# Patient Record
Sex: Male | Born: 1935 | Race: Black or African American | Hispanic: No | Marital: Single | Smoking: Former smoker
Health system: Southern US, Community
[De-identification: ages and names within clinical notes are randomized; demographics above are authoritative.]

## PROBLEM LIST (undated history)

## (undated) DIAGNOSIS — R569 Unspecified convulsions: Secondary | ICD-10-CM

## (undated) DIAGNOSIS — F32A Depression, unspecified: Secondary | ICD-10-CM

## (undated) DIAGNOSIS — I639 Cerebral infarction, unspecified: Secondary | ICD-10-CM

## (undated) DIAGNOSIS — G9340 Encephalopathy, unspecified: Secondary | ICD-10-CM

## (undated) DIAGNOSIS — I10 Essential (primary) hypertension: Secondary | ICD-10-CM

## (undated) DIAGNOSIS — F419 Anxiety disorder, unspecified: Secondary | ICD-10-CM

## (undated) DIAGNOSIS — R778 Other specified abnormalities of plasma proteins: Secondary | ICD-10-CM

## (undated) DIAGNOSIS — J189 Pneumonia, unspecified organism: Secondary | ICD-10-CM

## (undated) DIAGNOSIS — F0280 Dementia in other diseases classified elsewhere without behavioral disturbance: Secondary | ICD-10-CM

## (undated) DIAGNOSIS — E119 Type 2 diabetes mellitus without complications: Secondary | ICD-10-CM

## (undated) DIAGNOSIS — R7989 Other specified abnormal findings of blood chemistry: Secondary | ICD-10-CM

## (undated) DIAGNOSIS — H409 Unspecified glaucoma: Secondary | ICD-10-CM

## (undated) DIAGNOSIS — F0390 Unspecified dementia without behavioral disturbance: Secondary | ICD-10-CM

## (undated) DIAGNOSIS — E860 Dehydration: Secondary | ICD-10-CM

## (undated) DIAGNOSIS — G309 Alzheimer's disease, unspecified: Secondary | ICD-10-CM

## (undated) DIAGNOSIS — F329 Major depressive disorder, single episode, unspecified: Secondary | ICD-10-CM

## (undated) DIAGNOSIS — L8990 Pressure ulcer of unspecified site, unspecified stage: Secondary | ICD-10-CM

## (undated) DIAGNOSIS — E785 Hyperlipidemia, unspecified: Secondary | ICD-10-CM

## (undated) HISTORY — DX: Encephalopathy, unspecified: G93.40

## (undated) HISTORY — DX: Other specified abnormalities of plasma proteins: R77.8

## (undated) HISTORY — DX: Anxiety disorder, unspecified: F41.9

## (undated) HISTORY — DX: Unspecified glaucoma: H40.9

## (undated) HISTORY — DX: Other specified abnormal findings of blood chemistry: R79.89

## (undated) HISTORY — DX: Unspecified dementia, unspecified severity, without behavioral disturbance, psychotic disturbance, mood disturbance, and anxiety: F03.90

## (undated) HISTORY — DX: Dehydration: E86.0

## (undated) HISTORY — DX: Depression, unspecified: F32.A

## (undated) HISTORY — DX: Cerebral infarction, unspecified: I63.9

## (undated) HISTORY — DX: Pneumonia, unspecified organism: J18.9

## (undated) HISTORY — PX: NO PAST SURGERIES: SHX2092

## (undated) HISTORY — DX: Hyperlipidemia, unspecified: E78.5

## (undated) HISTORY — DX: Pressure ulcer of unspecified site, unspecified stage: L89.90

## (undated) HISTORY — DX: Major depressive disorder, single episode, unspecified: F32.9

---

## 2003-09-15 ENCOUNTER — Emergency Department (HOSPITAL_COMMUNITY): Admission: EM | Admit: 2003-09-15 | Discharge: 2003-09-15 | Payer: Self-pay | Admitting: Emergency Medicine

## 2003-09-17 ENCOUNTER — Emergency Department (HOSPITAL_COMMUNITY): Admission: EM | Admit: 2003-09-17 | Discharge: 2003-09-18 | Payer: Self-pay | Admitting: Emergency Medicine

## 2007-03-03 ENCOUNTER — Emergency Department (HOSPITAL_COMMUNITY): Admission: EM | Admit: 2007-03-03 | Discharge: 2007-03-03 | Payer: Self-pay | Admitting: Emergency Medicine

## 2007-09-30 ENCOUNTER — Emergency Department (HOSPITAL_COMMUNITY): Admission: EM | Admit: 2007-09-30 | Discharge: 2007-09-30 | Payer: Self-pay | Admitting: Emergency Medicine

## 2008-12-22 ENCOUNTER — Emergency Department (HOSPITAL_COMMUNITY): Admission: EM | Admit: 2008-12-22 | Discharge: 2008-12-22 | Payer: Self-pay | Admitting: Emergency Medicine

## 2010-07-23 LAB — URINALYSIS, ROUTINE W REFLEX MICROSCOPIC
Glucose, UA: 250 mg/dL — AB
Leukocytes, UA: NEGATIVE
Nitrite: NEGATIVE
Protein, ur: NEGATIVE mg/dL
Urobilinogen, UA: 1 mg/dL (ref 0.0–1.0)

## 2010-07-23 LAB — URINE MICROSCOPIC-ADD ON

## 2010-07-23 LAB — URINE CULTURE: Colony Count: 75000

## 2010-10-15 ENCOUNTER — Ambulatory Visit (HOSPITAL_COMMUNITY): Admit: 2010-10-15 | Payer: Self-pay | Admitting: Cardiology

## 2011-01-25 LAB — I-STAT 8, (EC8 V) (CONVERTED LAB)
BUN: 18
Glucose, Bld: 112 — ABNORMAL HIGH
Operator id: 151321
Potassium: 3.9
Sodium: 140
pH, Ven: 7.361 — ABNORMAL HIGH

## 2011-01-25 LAB — DIFFERENTIAL
Basophils Absolute: 0
Eosinophils Absolute: 0.1 — ABNORMAL LOW
Lymphs Abs: 2.7
Monocytes Relative: 7

## 2011-01-25 LAB — RAPID URINE DRUG SCREEN, HOSP PERFORMED
Benzodiazepines: NOT DETECTED
Cocaine: NOT DETECTED

## 2011-01-25 LAB — CBC
Hemoglobin: 14.3
RBC: 4.77

## 2011-01-25 LAB — POCT I-STAT CREATININE
Creatinine, Ser: 0.6
Operator id: 151321

## 2011-01-25 LAB — POCT CARDIAC MARKERS: Myoglobin, poc: 68

## 2013-11-30 ENCOUNTER — Inpatient Hospital Stay (HOSPITAL_COMMUNITY)
Admission: EM | Admit: 2013-11-30 | Discharge: 2013-12-03 | DRG: 066 | Disposition: A | Discharge status: Home / Self Care | Payer: Medicare HMO | Attending: Internal Medicine | Admitting: Internal Medicine

## 2013-11-30 ENCOUNTER — Emergency Department (HOSPITAL_COMMUNITY): Payer: Medicare HMO

## 2013-11-30 ENCOUNTER — Encounter (HOSPITAL_COMMUNITY): Payer: Self-pay | Admitting: Emergency Medicine

## 2013-11-30 DIAGNOSIS — I10 Essential (primary) hypertension: Secondary | ICD-10-CM | POA: Diagnosis present

## 2013-11-30 DIAGNOSIS — Z87891 Personal history of nicotine dependence: Secondary | ICD-10-CM

## 2013-11-30 DIAGNOSIS — R4182 Altered mental status, unspecified: Secondary | ICD-10-CM | POA: Diagnosis present

## 2013-11-30 DIAGNOSIS — Z8673 Personal history of transient ischemic attack (TIA), and cerebral infarction without residual deficits: Secondary | ICD-10-CM | POA: Diagnosis present

## 2013-11-30 DIAGNOSIS — F0280 Dementia in other diseases classified elsewhere without behavioral disturbance: Secondary | ICD-10-CM | POA: Diagnosis present

## 2013-11-30 DIAGNOSIS — Z79899 Other long term (current) drug therapy: Secondary | ICD-10-CM

## 2013-11-30 DIAGNOSIS — IMO0002 Reserved for concepts with insufficient information to code with codable children: Secondary | ICD-10-CM

## 2013-11-30 DIAGNOSIS — F0390 Unspecified dementia without behavioral disturbance: Secondary | ICD-10-CM

## 2013-11-30 DIAGNOSIS — I6350 Cerebral infarction due to unspecified occlusion or stenosis of unspecified cerebral artery: Secondary | ICD-10-CM | POA: Diagnosis present

## 2013-11-30 DIAGNOSIS — Z833 Family history of diabetes mellitus: Secondary | ICD-10-CM

## 2013-11-30 DIAGNOSIS — G9340 Encephalopathy, unspecified: Secondary | ICD-10-CM

## 2013-11-30 DIAGNOSIS — R1311 Dysphagia, oral phase: Secondary | ICD-10-CM | POA: Diagnosis present

## 2013-11-30 DIAGNOSIS — I519 Heart disease, unspecified: Secondary | ICD-10-CM | POA: Diagnosis present

## 2013-11-30 DIAGNOSIS — F0391 Unspecified dementia with behavioral disturbance: Secondary | ICD-10-CM

## 2013-11-30 DIAGNOSIS — IMO0001 Reserved for inherently not codable concepts without codable children: Secondary | ICD-10-CM | POA: Diagnosis present

## 2013-11-30 DIAGNOSIS — E1165 Type 2 diabetes mellitus with hyperglycemia: Secondary | ICD-10-CM | POA: Diagnosis present

## 2013-11-30 DIAGNOSIS — G309 Alzheimer's disease, unspecified: Secondary | ICD-10-CM | POA: Diagnosis present

## 2013-11-30 DIAGNOSIS — R569 Unspecified convulsions: Secondary | ICD-10-CM

## 2013-11-30 DIAGNOSIS — E785 Hyperlipidemia, unspecified: Secondary | ICD-10-CM

## 2013-11-30 DIAGNOSIS — Z66 Do not resuscitate: Secondary | ICD-10-CM | POA: Diagnosis present

## 2013-11-30 HISTORY — DX: Alzheimer's disease, unspecified: G30.9

## 2013-11-30 HISTORY — DX: Type 2 diabetes mellitus without complications: E11.9

## 2013-11-30 HISTORY — DX: Essential (primary) hypertension: I10

## 2013-11-30 HISTORY — DX: Dementia in other diseases classified elsewhere, unspecified severity, without behavioral disturbance, psychotic disturbance, mood disturbance, and anxiety: F02.80

## 2013-11-30 LAB — CBC WITH DIFFERENTIAL/PLATELET
BASOS ABS: 0 10*3/uL (ref 0.0–0.1)
BASOS PCT: 0 % (ref 0–1)
BASOS PCT: 0 % (ref 0–1)
Basophils Absolute: 0 10*3/uL (ref 0.0–0.1)
EOS PCT: 1 % (ref 0–5)
Eosinophils Absolute: 0 10*3/uL (ref 0.0–0.7)
Eosinophils Absolute: 0 10*3/uL (ref 0.0–0.7)
Eosinophils Relative: 0 % (ref 0–5)
HCT: 40.2 % (ref 39.0–52.0)
HCT: 38.5 % — ABNORMAL LOW (ref 39.0–52.0)
Hemoglobin: 13 g/dL (ref 13.0–17.0)
Hemoglobin: 12.4 g/dL — ABNORMAL LOW (ref 13.0–17.0)
LYMPHS ABS: 1.2 10*3/uL (ref 0.7–4.0)
LYMPHS PCT: 18 % (ref 12–46)
Lymphocytes Relative: 21 % (ref 12–46)
Lymphs Abs: 1.8 10*3/uL (ref 0.7–4.0)
MCH: 30 pg (ref 26.0–34.0)
MCH: 29.3 pg (ref 26.0–34.0)
MCHC: 32.3 g/dL (ref 30.0–36.0)
MCHC: 32.2 g/dL (ref 30.0–36.0)
MCV: 92.8 fL (ref 78.0–100.0)
MCV: 91 fL (ref 78.0–100.0)
MONO ABS: 0.4 10*3/uL (ref 0.1–1.0)
Monocytes Absolute: 0.7 10*3/uL (ref 0.1–1.0)
Monocytes Relative: 7 % (ref 3–12)
Monocytes Relative: 8 % (ref 3–12)
NEUTROS ABS: 4.9 10*3/uL (ref 1.7–7.7)
NEUTROS ABS: 6.2 10*3/uL (ref 1.7–7.7)
NEUTROS PCT: 74 % (ref 43–77)
NEUTROS PCT: 71 % (ref 43–77)
PLATELETS: 214 10*3/uL (ref 150–400)
Platelets: 206 10*3/uL (ref 150–400)
RBC: 4.33 MIL/uL (ref 4.22–5.81)
RBC: 4.23 MIL/uL (ref 4.22–5.81)
RDW: 12.5 % (ref 11.5–15.5)
RDW: 12.4 % (ref 11.5–15.5)
WBC: 6.5 10*3/uL (ref 4.0–10.5)
WBC: 8.8 10*3/uL (ref 4.0–10.5)

## 2013-11-30 LAB — COMPREHENSIVE METABOLIC PANEL
ALBUMIN: 3.4 g/dL — AB (ref 3.5–5.2)
ALK PHOS: 70 U/L (ref 39–117)
ALK PHOS: 68 U/L (ref 39–117)
ALT: 38 U/L (ref 0–53)
ALT: 35 U/L (ref 0–53)
ANION GAP: 13 (ref 5–15)
AST: 37 U/L (ref 0–37)
AST: 25 U/L (ref 0–37)
Albumin: 3.3 g/dL — ABNORMAL LOW (ref 3.5–5.2)
Anion gap: 13 (ref 5–15)
BILIRUBIN TOTAL: 0.3 mg/dL (ref 0.3–1.2)
BILIRUBIN TOTAL: 0.4 mg/dL (ref 0.3–1.2)
BUN: 20 mg/dL (ref 6–23)
BUN: 14 mg/dL (ref 6–23)
CALCIUM: 9.4 mg/dL (ref 8.4–10.5)
CHLORIDE: 101 meq/L (ref 96–112)
CO2: 23 meq/L (ref 19–32)
CO2: 23 mEq/L (ref 19–32)
CREATININE: 0.37 mg/dL — AB (ref 0.50–1.35)
Calcium: 9.2 mg/dL (ref 8.4–10.5)
Chloride: 99 mEq/L (ref 96–112)
Creatinine, Ser: 0.32 mg/dL — ABNORMAL LOW (ref 0.50–1.35)
GFR calc Af Amer: >90 mL/min (ref >90)
GFR calc non Af Amer: >90 mL/min (ref >90)
Glucose, Bld: 261 mg/dL — ABNORMAL HIGH (ref 70–99)
Glucose, Bld: 160 mg/dL — ABNORMAL HIGH (ref 70–99)
POTASSIUM: 4.9 meq/L (ref 3.7–5.3)
POTASSIUM: 4.1 meq/L (ref 3.7–5.3)
Sodium: 135 mEq/L — ABNORMAL LOW (ref 137–147)
Sodium: 137 mEq/L (ref 137–147)
TOTAL PROTEIN: 7.7 g/dL (ref 6.0–8.3)
Total Protein: 7.6 g/dL (ref 6.0–8.3)

## 2013-11-30 LAB — URINALYSIS, ROUTINE W REFLEX MICROSCOPIC
Bilirubin Urine: NEGATIVE
Glucose, UA: 500 mg/dL — AB
KETONES UR: NEGATIVE mg/dL
Leukocytes, UA: NEGATIVE
Nitrite: NEGATIVE
PROTEIN: NEGATIVE mg/dL
SPECIFIC GRAVITY, URINE: 1.02 (ref 1.005–1.030)
UROBILINOGEN UA: 1 mg/dL (ref 0.0–1.0)
pH: 7 (ref 5.0–8.0)

## 2013-11-30 LAB — TROPONIN I

## 2013-11-30 LAB — I-STAT TROPONIN, ED: TROPONIN I, POC: 0.01 ng/mL (ref 0.00–0.08)

## 2013-11-30 LAB — URINE MICROSCOPIC-ADD ON

## 2013-11-30 MED ORDER — CHLORHEXIDINE GLUCONATE 0.12 % MT SOLN
15.0000 mL | Freq: Two times a day (BID) | OROMUCOSAL | Status: DC
  Administered 2013-12-01 – 2013-12-03 (×5): 15 mL via OROMUCOSAL
  Filled 2013-11-30 (×9): qty 15

## 2013-11-30 MED ORDER — ENOXAPARIN SODIUM 40 MG/0.4ML ~~LOC~~ SOLN
40.0000 mg | SUBCUTANEOUS | Status: DC
  Administered 2013-11-30 – 2013-12-02 (×3): 40 mg via SUBCUTANEOUS
  Filled 2013-11-30 (×4): qty 0.4

## 2013-11-30 MED ORDER — CETYLPYRIDINIUM CHLORIDE 0.05 % MT LIQD
7.0000 mL | Freq: Two times a day (BID) | OROMUCOSAL | Status: DC
  Administered 2013-12-01 – 2013-12-03 (×4): 7 mL via OROMUCOSAL

## 2013-11-30 MED ORDER — SODIUM CHLORIDE 0.9 % IV SOLN
INTRAVENOUS | Status: AC
  Administered 2013-11-30 (×2): via INTRAVENOUS

## 2013-11-30 MED ORDER — HYDRALAZINE HCL 20 MG/ML IJ SOLN: 10.0000 mg | INTRAMUSCULAR | Status: DC | PRN

## 2013-11-30 MED ORDER — INSULIN ASPART 100 UNIT/ML ~~LOC~~ SOLN
0.0000 [IU] | Freq: Three times a day (TID) | SUBCUTANEOUS | Status: DC
  Administered 2013-12-01 (×2): 1 [IU] via SUBCUTANEOUS

## 2013-11-30 NOTE — ED Notes (Signed)
Pt states that there is nothing wrong and he does not know why his sister called EMS. Pt is alert to self, DOB, but answered year with [phone] and president as [place]. Pt reluctantly follows comands. Pt in NAD

## 2013-11-30 NOTE — ED Notes (Signed)
Pt in CT.

## 2013-11-30 NOTE — ED Notes (Signed)
Pt EKG changed and showed vent trigeminy. EKG performed and given to Dr. Littie DeedsGentry. Pt in NAD and VSS

## 2013-11-30 NOTE — ED Notes (Signed)
Pt from home vis EMS-Per EMS, pt lives with sister. Pt sister called EMS c/o AMS. Pt has hx of Alzheimers, DM II, HTN. Pt family reports that pt suddenly stopped eating and would not talk or respond to anyone. After a few minutes, pt started to respond and talk again. Pt calm and cooperative.  Pt in NAD

## 2013-11-30 NOTE — ED Provider Notes (Signed)
CSN: [id number]     Arrival date & time 11/30/13  1530 History   First MD Initiated Contact with Patient 11/30/13 1544     Chief Complaint  Patient presents with  . Altered Mental Status     (Consider location/radiation/quality/duration/timing/severity/associated sxs/prior Treatment) Patient is a 79 y.o. male presenting with altered mental status.  Altered Mental Status Presenting symptoms: partial responsiveness   Severity:  Moderate Most recent episode:  Today Episode history:  Single Duration: a few minutes. Timing:  Constant Progression:  Unchanged Chronicity:  New Context: dementia   Context: not head injury, not a recent change in medication (pt manages his owm medications despite alzheimers) and not a recent illness   Associated symptoms: no abdominal pain, no fever, no nausea and no vomiting     Past Medical History  Diagnosis Date  . Diabetes mellitus without complication   . Hypertension   . Alzheimer disease    History reviewed. No pertinent past surgical history. No family history on file. History  Substance Use Topics  . Smoking status: Former Games developermoker  . Smokeless tobacco: Not on file  . Alcohol Use: No    Review of Systems  Unable to perform ROS: Dementia  Constitutional: Negative for fever.  Gastrointestinal: Negative for nausea, vomiting and abdominal pain.      Allergies  Review of patient's allergies indicates not on file.  Home Medications   Prior to Admission medications   Not on File   BP 109/60  Pulse 84  Temp(Src) 99 F (37.2 C) (Oral)  Resp 22  SpO2 97% Physical Exam  Vitals reviewed. Constitutional: He appears well-developed and well-nourished.  HENT:  Head: Normocephalic and atraumatic.  Eyes: Conjunctivae and EOM are normal.  Neck: Normal range of motion. Neck supple.  Cardiovascular: Normal rate, regular rhythm and normal heart sounds.   Pulmonary/Chest: Effort normal and breath sounds normal. No respiratory distress.   Abdominal: He exhibits no distension. There is no tenderness. There is no rebound and no guarding.  Musculoskeletal: Normal range of motion.  Neurological: He is alert. He is disoriented.  Oriented to name and place, not year.  MAEW, no neuro deficits in context of limited exam due to compliance   Skin: Skin is warm and dry.    ED Course  Procedures (including critical care time) Labs Review Labs Reviewed - No data to display  Imaging Review No results found.   EKG Interpretation None      MDM   Final diagnoses:  None    79 y.o. male  with pertinent PMH of alzheimer's dementia, dm presents with altered mental status.  Pt was in his normal state of health until shortly PTA, had a brief (few minutes) episode of maintained posture but refusal to respond to questions.  This self resolved.  No fevers or other infectious symptoms reported.  Limited exam as above secondary to dementia, however no obvious deficits.    Labs and imaging as above reviewed. Pt had a very brief <10 second episode of rigors, refused to answer questions, and had HR 140, with poor O2 waveform.  He maintained posture, did not appear seizure like.  Troponin ordered due to tachycardia occurring during these episodes and possibility of anginal equivalent.  Also cannot rule out atypical seizure or TIA. Consulted hospitalist for admission.  1. Altered mental status, unspecified altered mental status type   2. Acute encephalopathy   3. Dementia, without behavioral disturbance   4. Diabetes mellitus type 2, uncontrolled  5. Essential hypertension         Mirian Mo, MD 12/01/13 [phone]

## 2013-11-30 NOTE — ED Notes (Signed)
Bed: [id number] Expected date: 11/30/13 Expected time: 3:16 PM Means of arrival: Ambulance Comments: AMS

## 2013-11-30 NOTE — ED Notes (Addendum)
Unable to completed NIHSS assessment, pt. Is uncooperative, confused and incoherent , unable/ refused to follow commands. Pt found bil. Arms strong enough to resist assessment, almost hit this Nurse upon doing assessment.  Dr. Toniann FailKakrakandy notified. Charge Nurse notified.

## 2013-11-30 NOTE — ED Notes (Signed)
Dr. Gentry at bedside. 

## 2013-11-30 NOTE — Progress Notes (Addendum)
Central Tele alerted this RN that patient was becoming tachycardic. This RN went to assess patient and found him masturbating. Asked patient to stop. He has stopped and gone back to sleep for now. HR returned to normal. Will continue to monitor.

## 2013-11-30 NOTE — H&P (Signed)
[facility] History and Physical  Eric CopaRobert Doris [id number] DOB: Oct 19, 1934 DOA: 11/30/2013  Referring physician: ER physician. PCP: No primary provider on file.   Chief Complaint: Altered mental status.  HPI: Eric Cameron is a 79 y.o. male with history of dementia, diabetes mellitus (off medications for last 6 months), hypertension and hyperlipidemia was brought to the ER after patient's sister found the patient suddenly slumped to the left side while sitting on the chair at around 2:30 PM. Patient also became rigid and unresponsive for less than 10 seconds. Following which patient was looking more confused. Patient was brought to the ER and had a similar episode in the ER which lasted for few seconds. After the incident patient per the family looks more confused than usual and was also tachycardic during the episode with rates going up to 140 per minute. CT head did not show anything acute. Patient at this time will be admitted for further observation and management. As per patient's sister with whom patient lives patient did not have any nausea vomiting abdominal pain fever chills diarrhea chest pain or shortness of breath. No new medications were added. On my exam patient is following commands but refuses to open his mouth and eyes.   Review of Systems: As presented in the history of presenting illness, rest negative.  Past Medical History  Diagnosis Date  . Diabetes mellitus without complication   . Hypertension   . Alzheimer disease    History reviewed. No pertinent past surgical history. Social History:  reports that he has quit smoking. He does not have any smokeless tobacco history on file. He reports that he does not drink alcohol or use illicit drugs. Where does patient live home. Can patient participate in ADLs? No.  No Known Allergies  Family History:  Family History  Problem Relation Age of Onset  . Diabetes Mellitus II Brother   . Dementia Neg Hx        Prior to Admission medications   Not on File    Physical Exam: Filed Vitals:   11/30/13 1845 11/30/13 1900 11/30/13 1930 11/30/13 1953  BP:  102/57 95/53 100/62  Pulse: 82 81 80 80  Temp:      TempSrc:      Resp: 18 15 16 16   SpO2: 94% 96% 96% 97%     General:  Moderately built and nourished.  Eyes:  Patient refuses to open his eyes.  ENT: Diffuse to open his mouth. No discharge from the ears nose.  Neck: No neck rigidity.  Cardiovascular: S1-S2 heard.  Respiratory: No rhonchi or crepitations.  Abdomen: Soft nontender bowel sounds present. No guarding or rigidity.  Skin: No rash.  Musculoskeletal: No edema.  Psychiatric: Patient is following commands.  Neurologic: Patient is following commands but refuses to open his eyes or mouth. Moves all extremities. Further CNS exam difficult.  Labs on Admission:  Basic Metabolic Panel:  Recent Labs Lab 11/30/13 1603  NA 135*  K 4.9  CL 99  CO2 23  GLUCOSE 261*  BUN 20  CREATININE 0.37*  CALCIUM 9.4   Liver Function Tests:  Recent Labs Lab 11/30/13 1603  AST 37  ALT 38  ALKPHOS 70  BILITOT 0.3  PROT 7.7  ALBUMIN 3.4*   No results found for this basename: LIPASE, AMYLASE,  in the last 168 hours No results found for this basename: AMMONIA,  in the last 168 hours CBC:  Recent Labs Lab 11/30/13 1603  WBC 6.5  NEUTROABS 4.9  HGB  13.0  HCT 40.2  MCV 92.8  PLT 214   Cardiac Enzymes: No results found for this basename: CKTOTAL, CKMB, CKMBINDEX, TROPONINI,  in the last 168 hours  BNP (last 3 results) No results found for this basename: PROBNP,  in the last 8760 hours CBG: No results found for this basename: GLUCAP,  in the last 168 hours  Radiological Exams on Admission: Dg Chest 2 View  11/30/2013   CLINICAL DATA:  Altered mental status. Demented. Diabetic. Hypertension.  EXAM: CHEST  2 VIEW  COMPARISON:  None.  FINDINGS: Lateral view degraded by patient arm position. Lateral view is also  oblique. Artifact degradation posteriorly.  Midline trachea. Borderline cardiomegaly, with a tortuous thoracic aorta. No pleural effusion or pneumothorax. Increased density at the lung bases, with suggestion of increased retrocardiac density on the lateral view. Upper lungs clear.  IMPRESSION: Bibasilar increased density. This could represent dependent atelectasis superimposed upon chronic interstitial thickening. However, early infection or aspiration cannot be excluded. Consider short term radiographic followup.  Multifactorial degradation involving the lateral view.   Electronically Signed   By: Jeronimo Greaves M.D.   On: 11/30/2013 16:37   Ct Head Wo Contrast  11/30/2013   CLINICAL DATA:  Altered mental status.  Diabetic.  Demented.  EXAM: CT HEAD WITHOUT CONTRAST  TECHNIQUE: Contiguous axial images were obtained from the base of the skull through the vertex without intravenous contrast.  COMPARISON:  03/03/2007  FINDINGS: Sinuses/Soft tissues: Clear paranasal sinuses and mastoid air cells.  Intracranial: Moderate low density in the periventricular white matter likely related to small vessel disease. No mass lesion, hemorrhage, hydrocephalus, acute infarct, intra-axial, or extra-axial fluid collection.  IMPRESSION: No acute intracranial abnormality.   Electronically Signed   By: Jeronimo Greaves M.D.   On: 11/30/2013 16:40    EKG: Independently reviewed. Normal sinus rhythm with PVCs.  Assessment/Plan Principal Problem:   Acute encephalopathy Active Problems:   HTN (hypertension)   Diabetes mellitus type 2, uncontrolled   Dementia   HLD (hyperlipidemia)   1. Acute encephalopathy - differentials include possible CVA versus seizure like episodes. I have discussed with on-call neurologist Dr. Roseanne Reno who has advised to get MRI brain and EEG. Patient will be closely observe for now. 2. Diabetes mellitus type 2 uncontrolled - per patient's sister patient's diabetic medications were recently discontinued 6  months ago. Closely follow CBGs with sliding-scale coverage and check hemoglobin A1c. 3. Hypertension - patient is presently n.p.o. since patient failed swallow. Get swallow evaluation. Until then patient will be on when necessary IV hydralazine. 4. Dementia - continue home medications.  Patient's home medications list has to be still updated.    Code Status: DO NOT RESUSCITATE as confirmed the patient's sister.  Family Communication: Patient's sister at the bedside.  Disposition Plan: Admit to inpatient.    Lilliemae Fruge N. [facility] Pager [phone].  If 7PM-7AM, please contact night-coverage [email or web address] Password [email or web address] 11/30/2013, 9:00 PM

## 2013-11-30 NOTE — ED Notes (Addendum)
Pt sister at bedside, reports that pt started to shake. Pt HR went u p to 143 and pt O2 dropped to 89%, and then quickly returned back to normal levels in approx 30 seconds. Pt in NAD. Pt VS WNL. EKG performed and given to Dr. Littie DeedsGentry

## 2013-12-01 ENCOUNTER — Observation Stay (HOSPITAL_COMMUNITY): Payer: Medicare HMO

## 2013-12-01 DIAGNOSIS — F0391 Unspecified dementia with behavioral disturbance: Secondary | ICD-10-CM

## 2013-12-01 DIAGNOSIS — I6350 Cerebral infarction due to unspecified occlusion or stenosis of unspecified cerebral artery: Principal | ICD-10-CM

## 2013-12-01 DIAGNOSIS — Z8673 Personal history of transient ischemic attack (TIA), and cerebral infarction without residual deficits: Secondary | ICD-10-CM | POA: Diagnosis present

## 2013-12-01 DIAGNOSIS — E785 Hyperlipidemia, unspecified: Secondary | ICD-10-CM

## 2013-12-01 DIAGNOSIS — F03918 Unspecified dementia, unspecified severity, with other behavioral disturbance: Secondary | ICD-10-CM

## 2013-12-01 LAB — GLUCOSE, CAPILLARY
GLUCOSE-CAPILLARY: 170 mg/dL — AB (ref 70–99)
GLUCOSE-CAPILLARY: 128 mg/dL — AB (ref 70–99)
Glucose-Capillary: 248 mg/dL — ABNORMAL HIGH (ref 70–99)
Glucose-Capillary: 146 mg/dL — ABNORMAL HIGH (ref 70–99)
Glucose-Capillary: 126 mg/dL — ABNORMAL HIGH (ref 70–99)
Glucose-Capillary: 147 mg/dL — ABNORMAL HIGH (ref 70–99)
Glucose-Capillary: 133 mg/dL — ABNORMAL HIGH (ref 70–99)

## 2013-12-01 LAB — HEMOGLOBIN A1C
Hgb A1c MFr Bld: 6.7 % — ABNORMAL HIGH (ref <5.7)
Mean Plasma Glucose: 146 mg/dL — ABNORMAL HIGH (ref <117)

## 2013-12-01 LAB — TROPONIN I: Troponin I: <0.3 ng/mL (ref <0.30)

## 2013-12-01 LAB — LIPID PANEL
CHOL/HDL RATIO: 2.5 ratio
Cholesterol: 142 mg/dL (ref 0–200)
HDL: 56 mg/dL (ref >39)
LDL Cholesterol: 75 mg/dL (ref 0–99)
TRIGLYCERIDES: 56 mg/dL (ref <150)
VLDL: 11 mg/dL (ref 0–40)

## 2013-12-01 LAB — TSH: TSH: 0.274 u[IU]/mL — ABNORMAL LOW (ref 0.350–4.500)

## 2013-12-01 MED ORDER — INSULIN ASPART 100 UNIT/ML ~~LOC~~ SOLN
0.0000 [IU] | SUBCUTANEOUS | Status: DC
  Administered 2013-12-01 (×2): 1 [IU] via SUBCUTANEOUS

## 2013-12-01 MED ORDER — HALOPERIDOL LACTATE 5 MG/ML IJ SOLN
5.0000 mg | Freq: Once | INTRAMUSCULAR | Status: AC
  Administered 2013-12-01: 5 mg via INTRAVENOUS
  Filled 2013-12-01: qty 1

## 2013-12-01 MED ORDER — ASPIRIN 300 MG RE SUPP
300.0000 mg | Freq: Every day | RECTAL | Status: DC
  Administered 2013-12-01 – 2013-12-02 (×2): 300 mg via RECTAL
  Filled 2013-12-01 (×2): qty 1

## 2013-12-01 NOTE — Progress Notes (Signed)
Received via [facility] transport; oriented to room and unit transfer; Dr.[name] notified of arrival and neurology text messaged of his arrival; orders carried out; patient sleepy but awakens to voice; vital signs documented; patient incontinent on arrival; condom cath. Replaced; telemetry applied and reattached to IV fluids.

## 2013-12-01 NOTE — Progress Notes (Signed)
SLP Cancellation Note  Patient Details Name: Eric CopaRobert Cameron MRN: [id number] DOB: Dec 10, 1934   Cancelled treatment:        Per RN, pt receiving Haldol for agitation. RN requested holding evaluation until tomorrow. Will continue efforts.  Eric Cameron, [facility] Arh HospitalMSP, CCC-SLP [phone]  Eric Cameron, Eric Cameron 12/01/2013, 10:17 AM

## 2013-12-01 NOTE — Progress Notes (Signed)
Spoke with MD.  Discussed pt's behavior and the need for Ortibital and abdominal films prior to MRI. Orders rec'd.

## 2013-12-01 NOTE — Consult Note (Signed)
Referring Physician: Dr. Rockne Menghini    Chief Complaint: altered mental status, pontine stroke on MRI  HPI:                                                                                                                                         Eric Cameron is an 79 y.o. male with a past medical history significant for HTN, DM, hyperlipidemia, and advanced dementia Alzheimer type, transferred to [facility] for further evaluation and management of pontine infarct. Patient has advanced dementia and can not reliably contribute to his clinical history, hence all data was obtained from his chart. He was initially admitted to [facility] on 8/15: " he was found slumped to the left side, and brought to the ER for evaluation along with this as well as rigidity and unresponsiveness for 10 seconds. He was also felt to be more confused than baseline.  CT on admission was negative for acute findings. However, a brain MRI obtained today disclosed the presence of small, acute pontine infarcts involving the left greater than the right pons. At this time, he open his eyes to verbal commands but doesn't engage or conversation or follows complex commands. Cholesterol 142, HDL 56, triglycerides 56, LDL 75. Earlier today he was described as combative and was kicking at RN while being assessed but once assessment was complete pt settled back down. Date last known well: 11/30/13 Time last known well: unable to determine tPA Given: no, late presentation   Past Medical History  Diagnosis Date  . Diabetes mellitus without complication   . Hypertension   . Alzheimer disease     History reviewed. No pertinent past surgical history.  Family History  Problem Relation Age of Onset  . Diabetes Mellitus II Brother   . Dementia Neg Hx    Social History:  reports that he has quit smoking. He does not have any smokeless tobacco history on file. He reports that he does not drink alcohol or use illicit drugs.  Allergies: No Known  Allergies  Medications:                                                                                                                           Scheduled: . antiseptic oral rinse  7 mL Mouth Rinse q12n4p  . aspirin  300 mg Rectal Daily  . chlorhexidine  15 mL Mouth Rinse BID  .  enoxaparin (LOVENOX) injection  40 mg Subcutaneous Q24H  . insulin aspart  0-9 Units Subcutaneous 6 times per day    ROS: unable to obtain due to mental status                                                                                                                                      History obtained from chart review   Physical exam: pleasant male in no apparent distress but uncooperative for exam. Blood pressure 92/39, pulse 63, temperature 98.9 F (37.2 C), temperature source Oral, resp. rate 12, height $RemoveBe'5\' 8"'RbZRqBhTg$  (1.727 m), weight 61.871 kg (136 lb 6.4 oz), SpO2 98.00%. Head: normocephalic. Neck: supple, no bruits, no JVD. Cardiac: no murmurs. Lungs: clear. Abdomen: soft, no tender, no mass. Extremities: no edema. Neurologic Examination:  Exam is very limited due to patient's underlying dementia and poor cooperation                          General: Mental Status: Mild lethargy, open eyes and follows some simple commands but inconsistently. Cranial Nerves: Resist eye examination and I can not reliably examine CN. Motor: Seems to move all limbs symmetrically. Sensory: reacts to noxious stimuli  Deep Tendon Reflexes:  1+ all over Plantars: Right: downgoing   Left: downgoing Cerebellar: Unable to test as he doesn't follow commands Gait:  Unable to test  Results for orders placed during the hospital encounter of 11/30/13 (from the past 48 hour(s))  CBC WITH DIFFERENTIAL     Status: None   Collection Time    11/30/13  4:03 PM      Result Value Ref Range   WBC 6.5  4.0 - 10.5 K/uL   RBC 4.33  4.22 - 5.81 MIL/uL   Hemoglobin 13.0  13.0 - 17.0 g/dL   HCT 40.2  39.0 - 52.0 %   MCV 92.8  78.0  - 100.0 fL   MCH 30.0  26.0 - 34.0 pg   MCHC 32.3  30.0 - 36.0 g/dL   RDW 12.5  11.5 - 15.5 %   Platelets 214  150 - 400 K/uL   Neutrophils Relative % 74  43 - 77 %   Neutro Abs 4.9  1.7 - 7.7 K/uL   Lymphocytes Relative 18  12 - 46 %   Lymphs Abs 1.2  0.7 - 4.0 K/uL   Monocytes Relative 7  3 - 12 %   Monocytes Absolute 0.4  0.1 - 1.0 K/uL   Eosinophils Relative 1  0 - 5 %   Eosinophils Absolute 0.0  0.0 - 0.7 K/uL   Basophils Relative 0  0 - 1 %   Basophils Absolute 0.0  0.0 - 0.1 K/uL  COMPREHENSIVE METABOLIC PANEL     Status: Abnormal   Collection Time    11/30/13  4:03 PM      Result Value Ref  Range   Sodium 135 (*) 137 - 147 mEq/L   Potassium 4.9  3.7 - 5.3 mEq/L   Comment: SLIGHT HEMOLYSIS     HEMOLYSIS AT THIS LEVEL MAY AFFECT RESULT   Chloride 99  96 - 112 mEq/L   CO2 23  19 - 32 mEq/L   Glucose, Bld 261 (*) 70 - 99 mg/dL   BUN 20  6 - 23 mg/dL   Creatinine, Ser 0.37 (*) 0.50 - 1.35 mg/dL   Calcium 9.4  8.4 - 10.5 mg/dL   Total Protein 7.7  6.0 - 8.3 g/dL   Albumin 3.4 (*) 3.5 - 5.2 g/dL   AST 37  0 - 37 U/L   Comment: SLIGHT HEMOLYSIS     HEMOLYSIS AT THIS LEVEL MAY AFFECT RESULT   ALT 38  0 - 53 U/L   Comment: SLIGHT HEMOLYSIS     HEMOLYSIS AT THIS LEVEL MAY AFFECT RESULT   Alkaline Phosphatase 70  39 - 117 U/L   Total Bilirubin 0.3  0.3 - 1.2 mg/dL   GFR calc non Af Amer >90  >90 mL/min   GFR calc Af Amer >90  >90 mL/min   Comment: (NOTE)     The eGFR has been calculated using the CKD EPI equation.     This calculation has not been validated in all clinical situations.     eGFR's persistently <90 mL/min signify possible Chronic Kidney     Disease.   Anion gap 13  5 - 15  GLUCOSE, CAPILLARY     Status: Abnormal   Collection Time    11/30/13  4:32 PM      Result Value Ref Range   Glucose-Capillary 248 (*) 70 - 99 mg/dL  URINALYSIS, ROUTINE W REFLEX MICROSCOPIC     Status: Abnormal   Collection Time    11/30/13  5:34 PM      Result Value Ref Range    Color, Urine YELLOW  YELLOW   APPearance CLEAR  CLEAR   Specific Gravity, Urine 1.020  1.005 - 1.030   pH 7.0  5.0 - 8.0   Glucose, UA 500 (*) NEGATIVE mg/dL   Hgb urine dipstick LARGE (*) NEGATIVE   Bilirubin Urine NEGATIVE  NEGATIVE   Ketones, ur NEGATIVE  NEGATIVE mg/dL   Protein, ur NEGATIVE  NEGATIVE mg/dL   Urobilinogen, UA 1.0  0.0 - 1.0 mg/dL   Nitrite NEGATIVE  NEGATIVE   Leukocytes, UA NEGATIVE  NEGATIVE  URINE MICROSCOPIC-ADD ON     Status: None   Collection Time    11/30/13  5:34 PM      Result Value Ref Range   RBC / HPF TOO NUMEROUS TO COUNT  <3 RBC/hpf  TSH     Status: Abnormal   Collection Time    11/30/13  7:05 PM      Result Value Ref Range   TSH 0.274 (*) 0.350 - 4.500 uIU/mL   Comment: Performed at [place], ED     Status: None   Collection Time    11/30/13  7:09 PM      Result Value Ref Range   Troponin i, poc 0.01  0.00 - 0.08 ng/mL   Comment 3            Comment: Due to the release kinetics of cTnI,     a negative result within the first hours     of the onset of symptoms does not rule out  myocardial infarction with certainty.     If myocardial infarction is still suspected,     repeat the test at appropriate intervals.  TROPONIN I     Status: None   Collection Time    11/30/13  9:35 PM      Result Value Ref Range   Troponin I <0.30  <0.30 ng/mL   Comment:            Due to the release kinetics of cTnI,     a negative result within the first hours     of the onset of symptoms does not rule out     myocardial infarction with certainty.     If myocardial infarction is still suspected,     repeat the test at appropriate intervals.  HEMOGLOBIN A1C     Status: Abnormal   Collection Time    11/30/13  9:35 PM      Result Value Ref Range   Hemoglobin A1C 6.7 (*) <5.7 %   Comment: (NOTE)                                                                               According to the ADA Clinical Practice  Recommendations for 2011, when     HbA1c is used as a screening test:      >=6.5%   Diagnostic of Diabetes Mellitus               (if abnormal result is confirmed)     5.7-6.4%   Increased risk of developing Diabetes Mellitus     References:Diagnosis and Classification of Diabetes Mellitus,Diabetes     KYHC,6237,62(GBTDV 1):S62-S69 and Standards of Medical Care in             Diabetes - 2011,Diabetes Care,2011,34 (Suppl 1):S11-S61.   Mean Plasma Glucose 146 (*) <117 mg/dL   Comment: Performed at [place], [facility]     Status: Abnormal   Collection Time    11/30/13 10:13 PM      Result Value Ref Range   Glucose-Capillary 170 (*) 70 - 99 mg/dL  COMPREHENSIVE METABOLIC PANEL     Status: Abnormal   Collection Time    11/30/13 10:51 PM      Result Value Ref Range   Sodium 137  137 - 147 mEq/L   Potassium 4.1  3.7 - 5.3 mEq/L   Chloride 101  96 - 112 mEq/L   CO2 23  19 - 32 mEq/L   Glucose, Bld 160 (*) 70 - 99 mg/dL   BUN 14  6 - 23 mg/dL   Creatinine, Ser 0.32 (*) 0.50 - 1.35 mg/dL   Calcium 9.2  8.4 - 10.5 mg/dL   Total Protein 7.6  6.0 - 8.3 g/dL   Albumin 3.3 (*) 3.5 - 5.2 g/dL   AST 25  0 - 37 U/L   ALT 35  0 - 53 U/L   Alkaline Phosphatase 68  39 - 117 U/L   Total Bilirubin 0.4  0.3 - 1.2 mg/dL   GFR calc non Af Amer >90  >90 mL/min   GFR calc Af Amer >90  >90 mL/min   Comment: (NOTE)  The eGFR has been calculated using the CKD EPI equation.     This calculation has not been validated in all clinical situations.     eGFR's persistently <90 mL/min signify possible Chronic Kidney     Disease.   Anion gap 13  5 - 15  CBC WITH DIFFERENTIAL     Status: Abnormal   Collection Time    11/30/13 10:51 PM      Result Value Ref Range   WBC 8.8  4.0 - 10.5 K/uL   RBC 4.23  4.22 - 5.81 MIL/uL   Hemoglobin 12.4 (*) 13.0 - 17.0 g/dL   HCT 38.5 (*) 39.0 - 52.0 %   MCV 91.0  78.0 - 100.0 fL   MCH 29.3  26.0 - 34.0 pg   MCHC 32.2  30.0 - 36.0 g/dL   RDW 12.4   11.5 - 15.5 %   Platelets 206  150 - 400 K/uL   Neutrophils Relative % 71  43 - 77 %   Neutro Abs 6.2  1.7 - 7.7 K/uL   Lymphocytes Relative 21  12 - 46 %   Lymphs Abs 1.8  0.7 - 4.0 K/uL   Monocytes Relative 8  3 - 12 %   Monocytes Absolute 0.7  0.1 - 1.0 K/uL   Eosinophils Relative 0  0 - 5 %   Eosinophils Absolute 0.0  0.0 - 0.7 K/uL   Basophils Relative 0  0 - 1 %   Basophils Absolute 0.0  0.0 - 0.1 K/uL  GLUCOSE, CAPILLARY     Status: Abnormal   Collection Time    12/01/13  4:06 AM      Result Value Ref Range   Glucose-Capillary 146 (*) 70 - 99 mg/dL  TROPONIN I     Status: None   Collection Time    12/01/13  5:16 AM      Result Value Ref Range   Troponin I <0.30  <0.30 ng/mL   Comment:            Due to the release kinetics of cTnI,     a negative result within the first hours     of the onset of symptoms does not rule out     myocardial infarction with certainty.     If myocardial infarction is still suspected,     repeat the test at appropriate intervals.  GLUCOSE, CAPILLARY     Status: Abnormal   Collection Time    12/01/13  7:36 AM      Result Value Ref Range   Glucose-Capillary 126 (*) 70 - 99 mg/dL  TROPONIN I     Status: None   Collection Time    12/01/13 11:04 AM      Result Value Ref Range   Troponin I <0.30  <0.30 ng/mL   Comment:            Due to the release kinetics of cTnI,     a negative result within the first hours     of the onset of symptoms does not rule out     myocardial infarction with certainty.     If myocardial infarction is still suspected,     repeat the test at appropriate intervals.  LIPID PANEL     Status: None   Collection Time    12/01/13 11:04 AM      Result Value Ref Range   Cholesterol 142  0 - 200 mg/dL   Triglycerides 56  <150  mg/dL   HDL 56  >39 mg/dL   Total CHOL/HDL Ratio 2.5     VLDL 11  0 - 40 mg/dL   LDL Cholesterol 75  0 - 99 mg/dL   Comment:            Total Cholesterol/HDL:CHD Risk     Coronary Heart  Disease Risk Table                         Men   Women      1/2 Average Risk   3.4   3.3      Average Risk       5.0   4.4      2 X Average Risk   9.6   7.1      3 X Average Risk  23.4   11.0                Use the calculated Patient Ratio     above and the CHD Risk Table     to determine the patient's CHD Risk.                ATP III CLASSIFICATION (LDL):      <100     mg/dL   Optimal      100-129  mg/dL   Near or Above                        Optimal      130-159  mg/dL   Borderline      160-189  mg/dL   High      >190     mg/dL   Very High     Performed at [place], [facility]     Status: Abnormal   Collection Time    12/01/13 12:00 PM      Result Value Ref Range   Glucose-Capillary 147 (*) 70 - 99 mg/dL  GLUCOSE, CAPILLARY     Status: Abnormal   Collection Time    12/01/13  4:30 PM      Result Value Ref Range   Glucose-Capillary 128 (*) 70 - 99 mg/dL   Dg Eye Foreign Body  12/01/2013   CLINICAL DATA:  Metal working/exposure; clearance prior to MRI  EXAM: ORBITS FOR FOREIGN BODY - 2 VIEW  COMPARISON:  None.  FINDINGS: There is no evidence of metallic foreign body within the orbits. No significant bone abnormality identified.  IMPRESSION: No evidence of metallic foreign body within the orbits.   Electronically Signed   By: Logan Bores   On: 12/01/2013 12:48   Dg Chest 2 View  11/30/2013   CLINICAL DATA:  Altered mental status. Demented. Diabetic. Hypertension.  EXAM: CHEST  2 VIEW  COMPARISON:  None.  FINDINGS: Lateral view degraded by patient arm position. Lateral view is also oblique. Artifact degradation posteriorly.  Midline trachea. Borderline cardiomegaly, with a tortuous thoracic aorta. No pleural effusion or pneumothorax. Increased density at the lung bases, with suggestion of increased retrocardiac density on the lateral view. Upper lungs clear.  IMPRESSION: Bibasilar increased density. This could represent dependent atelectasis superimposed upon  chronic interstitial thickening. However, early infection or aspiration cannot be excluded. Consider short term radiographic followup.  Multifactorial degradation involving the lateral view.   Electronically Signed   By: Abigail Miyamoto M.D.   On: 11/30/2013 16:37   Dg Abd 1 View  12/01/2013   CLINICAL  DATA:  Pre MRI clearing films.  EXAM: ABDOMEN - 1 VIEW  COMPARISON:  None.  FINDINGS: No radiopaque foreign bodies. Specifically, there is no evidence of a metallic foreign body. Normal bowel gas pattern. Soft tissues are unremarkable. There are mild degenerative changes along the visualized spine. Bones are demineralized.  IMPRESSION: No acute findings.  No metallic foreign bodies.   Electronically Signed   By: Lajean Manes M.D.   On: 12/01/2013 12:46   Ct Head Wo Contrast  11/30/2013   CLINICAL DATA:  Altered mental status.  Diabetic.  Demented.  EXAM: CT HEAD WITHOUT CONTRAST  TECHNIQUE: Contiguous axial images were obtained from the base of the skull through the vertex without intravenous contrast.  COMPARISON:  03/03/2007  FINDINGS: Sinuses/Soft tissues: Clear paranasal sinuses and mastoid air cells.  Intracranial: Moderate low density in the periventricular white matter likely related to small vessel disease. No mass lesion, hemorrhage, hydrocephalus, acute infarct, intra-axial, or extra-axial fluid collection.  IMPRESSION: No acute intracranial abnormality.   Electronically Signed   By: Abigail Miyamoto M.D.   On: 11/30/2013 16:40   Mri Brain Without Contrast  12/01/2013   CLINICAL DATA:  Acute encephalopathy. History dementia and diabetes.  EXAM: MRI HEAD WITHOUT CONTRAST  TECHNIQUE: Multiplanar, multiecho pulse sequences of the brain and surrounding structures were obtained without intravenous contrast.  COMPARISON:  Head CT 11/30/2013  FINDINGS: There are small, acute infarcts involving the left greater than right pons. Punctate foci of restricted diffusion versus artifact are questioned along the right  and left lateral aspects of the splenium of the corpus callosum. No acute large territory supratentorial infarct is seen. There is no intracranial hemorrhage, mass, midline shift, or extra-axial fluid collection. There is mild-to-moderate generalized cerebral atrophy. T2 hyperintensities in the cerebral white matter and pons are nonspecific but compatible with mild chronic small vessel ischemic disease.  Orbits are unremarkable. Paranasal sinuses are clear. There may be trace bilateral mastoid effusions. Major intracranial vascular flow voids are preserved.  IMPRESSION: 1. Small, acute pontine infarcts. 2. Artifact versus punctate infarcts along the lateral margins of the splenium of the corpus callosum.   Electronically Signed   By: Logan Bores   On: 12/01/2013 13:34    Assessment: 79 y.o. male with advanced dementia and small pontine infarcts. He is DNR. Failed swallowing evaluation thus will recommend rectal aspirin. Stroke work up underway. Stroke team will resume care in am.  Stroke Risk Factors - age, HTN, DM, hyperlipidemia,   Plan: 1. HgbA1c, fasting lipid panel 2. MRI, MRA  of the brain without contrast 3. Echocardiogram 4. Carotid dopplers 5. Prophylactic therapy-aspirin rectally. 6. Risk factor modification 7. Telemetry monitoring 8. Frequent neuro checks 9. PT/OT SLP    Dorian Pod, MD [facility] [phone]  12/01/2013, 7:04 PM

## 2013-12-01 NOTE — Progress Notes (Signed)
Attempted to assess patient. Pt became combative and started kicking at RN. Once assessment was complete pt settled back down. ... Will notify MD to discuss plan of care related to MRI ordered and pt combativness

## 2013-12-01 NOTE — Progress Notes (Signed)
Attempted to call report to RN on 4N. RN is unavailable at this time. Will call back. [facility] called to transport patient

## 2013-12-01 NOTE — Progress Notes (Signed)
Progress Note   Eric Cameron:[id number] DOB: July 09, 1934 DOA: 11/30/2013 PCP: No primary provider on file.   Brief Narrative:   Eric Cameron is an 79 y.o. male with a PMH of dementia, diabetes, hypertension and hyperlipidemia who was found slumped to the left side, and brought to the ER for evaluation along with this as well as rigidity and unresponsiveness for 10 seconds. He was also felt to be more confused than baseline. CT on admission was negative for acute findings.  Assessment/Plan:   Principal Problem:   Acute encephalopathy secondary to acute pontine infarcts  Neurology consultation pending.  CT of the head negative. MRI shows small, acute infarcts involving the left greater than right pons.  Continue neuro checks.  Check 2-D echocardiogram and carotid Dopplers.  Give aspirin per rectum. Check FLP and hemoglobin A1c.  Transfer to [facility] for consultation with stroke team.  Active Problems:   HTN (hypertension)  Normally on Lotrel. By mouth medications on hold. Blood pressure currently stable.    Diabetes mellitus type 2, uncontrolled  Has been diet controlled prior to admission. Check hemoglobin A1c.  Continue SSI Q 4 hours for now.    Dementia  Namenda on hold.    HLD (hyperlipidemia)  Lipitor on hold.    DVT Prophylaxis  Continue Lovenox.  Code Status: DNR Family Communication: Sister at the bedside. Disposition Plan: Home when stable.   IV Access:    Peripheral IV   Procedures and diagnostic studies:    Chest x-ray 11/30/13: Bibasilar increased density which could represent dependent atelectasis superimposed upon chronic interstitial thickening. Early infection/aspiration cannot be excluded. Multifactorial degradation involving the lateral view.  CT of the head 11/30/13: No acute intracranial abnormality.  1 view of abdomen 12/01/13: No acute findings. No metallic foreign bodies.  DG Eye foreign body 12/01/13: No evidence of metallic  foreign body within the orbits. No significant bone abnormality.  MRI brain 12/01/13: Small, acute pontine infarct. Artifact versus punctate infarcts along the lateral margins of the splenium of the corpus callosum.  Medical Consultants:    Neurology  Other Consultants:    Speech therapy   Anti-Infectives:    None.  Subjective:   [name] has been combative and agitated. Currently sedated on Haldol.  Objective:    Filed Vitals:   11/30/13 2200 11/30/13 2225 12/01/13 0159 12/01/13 0528  BP: 123/75 104/50 129/68 94/49  Pulse: 83 77  81  Temp:  98.2 F (36.8 C) 98.2 F (36.8 C) 97.5 F (36.4 C)  TempSrc:  Axillary Axillary Axillary  Resp: 25 20 24 18   Height: 5\' 8"  (1.727 m)     Weight: 61.871 kg (136 lb 6.4 oz)     SpO2: 93% 98% 98% 96%    Intake/Output Summary (Last 24 hours) at 12/01/13 0857 Last data filed at 12/01/13 0500  Gross per 24 hour  Intake 349.17 ml  Output    600 ml  Net -250.83 ml    Exam: Gen:  NAD, sedated Neuro: Unable to assess, sedated  Cardiovascular:  RRR, No M/R/G Respiratory:  Lungs CTAB Gastrointestinal:  Abdomen soft, NT/ND, + BS Extremities:  No C/E/C   Data Reviewed:    Labs: Basic Metabolic Panel:  Recent Labs Lab 11/30/13 1603 11/30/13 2251  NA 135* 137  K 4.9 4.1  CL 99 101  CO2 23 23  GLUCOSE 261* 160*  BUN 20 14  CREATININE 0.37* 0.32*  CALCIUM 9.4 9.2   GFR Estimated Creatinine Clearance: 67.7  ml/min (by C-G formula based on Cr of 0.32). Liver Function Tests:  Recent Labs Lab 11/30/13 1603 11/30/13 2251  AST 37 25  ALT 38 35  ALKPHOS 70 68  BILITOT 0.3 0.4  PROT 7.7 7.6  ALBUMIN 3.4* 3.3*   CBC:  Recent Labs Lab 11/30/13 1603 11/30/13 2251  WBC 6.5 8.8  NEUTROABS 4.9 6.2  HGB 13.0 12.4*  HCT 40.2 38.5*  MCV 92.8 91.0  PLT 214 206   Cardiac Enzymes:  Recent Labs Lab 11/30/13 2135 12/01/13 0516  TROPONINI <0.30 <0.30   CBG:  Recent Labs Lab 11/30/13 1632  11/30/13 2213 12/01/13 0406 12/01/13 0736  GLUCAP 248* 170* 146* 126*   Microbiology No results found for this or any previous visit (from the past 240 hour(s)).   Medications:   . antiseptic oral rinse  7 mL Mouth Rinse q12n4p  . chlorhexidine  15 mL Mouth Rinse BID  . enoxaparin (LOVENOX) injection  40 mg Subcutaneous Q24H  . insulin aspart  0-9 Units Subcutaneous TID WC   Continuous Infusions: . sodium chloride 50 mL/hr at 11/30/13 2221    Time spent: 35 minutes with > 50% of time discussing current diagnostic test results, clinical impression and plan of care with the patient's sister, coordinating care with neurologist.    LOS: 1 day   [name],[name]  [facility] Pager [phone]. If unable to reach me by pager, please call my cell phone at [phone].  *Please refer to [email or web address], password [email or web address] to get updated schedule on who will round on this patient, as hospitalists switch teams weekly. If 7PM-7AM, please contact night-coverage at [email or web address], password [email or web address] for any overnight needs.  12/01/2013, 8:57 AM    **Disclaimer: This note was dictated with voice recognition software. Similar sounding words can inadvertently be transcribed and this note may contain transcription errors which may not have been corrected upon publication of note.**

## 2013-12-01 NOTE — Progress Notes (Signed)
Pt neuro assessment unchanged. Pt is going to Xray and then MRI.  Cooperative at this time. No longer hitting at staff. Pt cleaned up another condom cath placed. Transported to Radiology with NT. Cont with plan of care.

## 2013-12-02 ENCOUNTER — Inpatient Hospital Stay (HOSPITAL_COMMUNITY): Payer: Medicare HMO

## 2013-12-02 DIAGNOSIS — I6789 Other cerebrovascular disease: Secondary | ICD-10-CM

## 2013-12-02 DIAGNOSIS — R4182 Altered mental status, unspecified: Secondary | ICD-10-CM

## 2013-12-02 LAB — TSH: TSH: 0.4 u[IU]/mL (ref 0.350–4.500)

## 2013-12-02 LAB — GLUCOSE, CAPILLARY
GLUCOSE-CAPILLARY: 103 mg/dL — AB (ref 70–99)
GLUCOSE-CAPILLARY: 165 mg/dL — AB (ref 70–99)
GLUCOSE-CAPILLARY: 224 mg/dL — AB (ref 70–99)
Glucose-Capillary: 111 mg/dL — ABNORMAL HIGH (ref 70–99)

## 2013-12-02 LAB — T4, FREE: Free T4: 1.19 ng/dL (ref 0.80–1.80)

## 2013-12-02 MED ORDER — INSULIN ASPART 100 UNIT/ML ~~LOC~~ SOLN
0.0000 [IU] | Freq: Three times a day (TID) | SUBCUTANEOUS | Status: DC
  Administered 2013-12-03: 7 [IU] via SUBCUTANEOUS
  Administered 2013-12-03: 2 [IU] via SUBCUTANEOUS

## 2013-12-02 MED ORDER — ASPIRIN 325 MG PO TABS
325.0000 mg | ORAL_TABLET | Freq: Every day | ORAL | Status: DC
Start: 2013-12-03 — End: 2013-12-03
  Administered 2013-12-03: 325 mg via ORAL
  Filled 2013-12-02: qty 1

## 2013-12-02 MED ORDER — ATORVASTATIN CALCIUM 10 MG PO TABS
20.0000 mg | ORAL_TABLET | Freq: Every day | ORAL | Status: DC
  Administered 2013-12-02: 20 mg via ORAL
  Filled 2013-12-02: qty 2

## 2013-12-02 MED ORDER — MEMANTINE HCL 10 MG PO TABS
10.0000 mg | ORAL_TABLET | Freq: Two times a day (BID) | ORAL | Status: DC
  Administered 2013-12-02 – 2013-12-03 (×2): 10 mg via ORAL
  Filled 2013-12-02 (×3): qty 1

## 2013-12-02 NOTE — Procedures (Signed)
Objective Swallowing Evaluation: Modified Barium Swallowing Study  Patient Details  Name: Eric Cameron MRN: [id number] Date of Birth: [birth year]  Today's Date: 12/02/2013 Time: 7829-5621 SLP Time Calculation (min): 19 min  Past Medical History:  Past Medical History  Diagnosis Date  . Diabetes mellitus without complication   . Hypertension   . Alzheimer disease    Past Surgical History: History reviewed. No pertinent past surgical history. HPI:  Patient is a 79 y.o. male with a past medical history significant for HTN, DM, hyperlipidemia, and advanced dementia Alzheimer type who was admitted to [facility] on 8/15 after being found slumped to the left side with more confusion. Pt was transferred to [facility] for further evaluation. CT without acute findings, however, MRI showed small, acute pontine infarcts involving the left greater than the right pons.       Assessment / Plan / Recommendation Clinical Impression  Dysphagia Diagnosis: Mild oral phase dysphagia;Mild pharyngeal phase dysphagia Clinical impression: Pt demonstrates a mild oral phase dysphagia characterized with lingual rocking movement and piecemeal transit of boluses consistent with baseline cognitive impairment. Oropharyngeal phase was adequate with good airway protection, no frank penetration or aspiration observed. Pt did have an intermittent delay in swallow initiation (1 out of 5) trials, with no negative impact to function. Pt is recommended to consume a dys 3 (mechanical soft) diet (given missing dentition) with thin liquids and basic aspiration precautions. No SLP f/u needed, will sign off.     Treatment Recommendation  No treatment recommended at this time    Diet Recommendation Dysphagia 3 (Mechanical Soft);Thin liquid   Liquid Administration via: Cup;Straw Medication Administration: Whole meds with liquid Supervision: Patient able to self feed Compensations: Slow rate;Small sips/bites Postural Changes and/or Swallow  Maneuvers: Seated upright 90 degrees    Other  Recommendations Oral Care Recommendations: Oral care BID   Follow Up Recommendations  24 hour supervision/assistance    Frequency and Duration        Pertinent Vitals/Pain None     SLP Swallow Goals     General Date of Onset: 11/30/13 HPI: Patient is a 79 y.o. male with a past medical history significant for HTN, DM, hyperlipidemia, and advanced dementia Alzheimer type who was admitted to [facility] on 8/15 after being found slumped to the left side with more confusion. Pt was transferred to [facility] for further evaluation. CT without acute findings, however, MRI showed small, acute pontine infarcts involving the left greater than the right pons.   Type of Study: Modified Barium Swallowing Study Reason for Referral: Objectively evaluate swallowing function Previous Swallow Assessment: N/A Diet Prior to this Study: NPO Temperature Spikes Noted: No Respiratory Status: Room air History of Recent Intubation: No Behavior/Cognition: Requires cueing;Confused;Pleasant mood;Cooperative Oral Cavity - Dentition: Poor condition;Missing dentition Oral Motor / Sensory Function: Within functional limits Self-Feeding Abilities: Able to feed self Patient Positioning: Upright in chair Baseline Vocal Quality: Clear Volitional Cough: Strong Volitional Swallow: Able to elicit Anatomy: Within functional limits Pharyngeal Secretions: Normal    Reason for Referral Objectively evaluate swallowing function   Oral Phase Oral Preparation/Oral Phase Oral Phase: Impaired Oral - Thin Oral - Thin Cup: Lingual pumping;Piecemeal swallowing Oral - Thin Straw: Lingual pumping;Piecemeal swallowing Oral - Solids Oral - Puree: Lingual pumping;Piecemeal swallowing Oral - Mechanical Soft: Lingual pumping;Piecemeal swallowing Oral - Pill: Lingual pumping;Piecemeal swallowing   Pharyngeal Phase Pharyngeal Phase Pharyngeal Phase: Impaired Pharyngeal - Thin Pharyngeal - Thin  Cup: Within functional limits;Delayed swallow initiation;Penetration/Aspiration during swallow;Pharyngeal residue - pyriform sinuses Penetration/Aspiration details (  thin cup): Material enters airway, remains ABOVE vocal cords then ejected out Pharyngeal - Thin Straw: Within functional limits;Delayed swallow initiation;Penetration/Aspiration during swallow;Pharyngeal residue - pyriform sinuses Penetration/Aspiration details (thin straw): Material enters airway, remains ABOVE vocal cords then ejected out Pharyngeal - Solids Pharyngeal - Puree: Premature spillage to valleculae Pharyngeal - Mechanical Soft: Premature spillage to valleculae Pharyngeal - Pill: Within functional limits;Delayed swallow initiation;Penetration/Aspiration during swallow;Pharyngeal residue - pyriform sinuses Penetration/Aspiration details (pill): Material enters airway, remains ABOVE vocal cords then ejected out  Cervical Esophageal Phase    GO    Cervical Esophageal Phase [facility]erv[facility]ophageal [facility]: [facility] Cervical Esophageal Phase - Comment Cervical Esophageal Comment: Pt observed to have appearance of bony protrusions on the anterior surface of the cervical spine that did not signficantly impact bolus transit through the cervical esophagus. (no radiologist present to confirm)        Eric DittyBonnie Deloris Moger, MA CCC-SLP [phone]  Eric Cameron, Eric Cameron 12/02/2013, 3:28 PM

## 2013-12-02 NOTE — Progress Notes (Signed)
*  PRELIMINARY RESULTS* Vascular Ultrasound Carotid Duplex (Doppler) has been completed.  Study was technically difficult due to poor patient cooperation. Findings suggest 1-39% internal carotid artery stenosis bilaterally. Vertebral arteries are patent with antegrade flow.  12/02/2013 2:36 PM Gertie FeyMichelle Alle Difabio, RVT, RDCS, RDMS

## 2013-12-02 NOTE — Progress Notes (Signed)
[facility]                                                                              Patient Demographics  Eric Cameron, is a 79 y.o. male, DOB - 12-30-1934, WUJ:[id number]  Admit date - 11/30/2013   Admitting Physician Eduard Clos, MD  Outpatient Primary MD for the patient is No primary provider on file.  LOS - 2   Chief Complaint  Patient presents with  . Altered Mental Status      HPI on 11/30/2013 Eric Cameron is a 79 y.o. male with history of dementia, diabetes mellitus (off medications for last 6 months), hypertension and hyperlipidemia was brought to the ER after patient's sister found the patient suddenly slumped to the left side while sitting on the chair at around 2:30 PM on the day of admission. Patient also became rigid and unresponsive for less than 10 seconds. Following that, the patient was looking more confused. Patient was brought to the ER and had a similar episode in the ER which lasted for few seconds. After the incident, patient per the family looked more confused than usual and was also tachycardic during the episode with rates going up to 140 per minute. CT head did not show anything acute. Patient was admitted for further observation and management. As per patient's sister with whom patient lives, patient did not have any nausea, vomiting, abdominal pain, fever, chills, diarrhea, chest pain, or shortness of breath. No new medications were added. On initial exam, patient was following commands but refuses to open his mouth and eyes.   Interim history Patient was admitted to [facility] ED.  MRI of brain showed acute CVA.  Patient was transferred to [facility] for further workup.   Assessment & Plan   Acute CVA  -MRI of the brain:Small, acute pontine infarcts. -MRA of the head: Normal intracranial MR angiography of the large and medium size vessels.  -Neurology consulted appreciated -Echocardiogram, carotid Doppler are currently pending -LDL 75,  hemoglobin A1c 6.7 -Continue rectal aspirin -PT and OT consulted -Speech therapy has been consulted as patient failed a swallow study, recommend n.p.o. and modified barium swallow  Acute encephalopathy -Likely secondary to the above -Patient currently afebrile with no leukocytosis -Chest x-ray:Bibasilar increased density. This could represent dependent atelectasis superimposed upon chronic interstitial thickening. However, early infection or aspiration cannot be excluded -Abdominal x-ray: No acute finding -Will likely repeat chest x-ray -UA negative for infection  Essential hypertension -Blood-pressure medications currently on hold (Lotrel) -Allow permissive hypertension  Diabetes mellitus type 2 -Patient has not been on any medications for the past few months. -Hemoglobin A1c 6.7 -Continue him from plain films CBG monitoring  Dementia -Namenda currently held  Hyperlipidemia -Lipid profile: Cholesterol 142, triglycerides 56, HDL 56, LDL 75 -Continue statin once patient is able to swallow  Code Status: DO NOT RESUSCITATE  Family Communication: None at bedside  Disposition Plan: Admitted, pending further workup  Time Spent in minutes   30 minutes  Procedures  None  Consults   Neurology  DVT Prophylaxis  Lovenox  Lab Results  Component Value Date   PLT 206 11/30/2013    Medications  Scheduled Meds: . antiseptic  oral rinse  7 mL Mouth Rinse q12n4p  . aspirin  300 mg Rectal Daily  . chlorhexidine  15 mL Mouth Rinse BID  . enoxaparin (LOVENOX) injection  40 mg Subcutaneous Q24H  . insulin aspart  0-9 Units Subcutaneous 6 times per day   Continuous Infusions:  PRN Meds:.hydrALAZINE  Antibiotics    Anti-infectives   None     Subjective:   Eric Cameron seen and examined today.  Patient is alert and oriented to himself, has no complaints this morning. Follow commands.    Objective:   Filed Vitals:   12/02/13 0143 12/02/13 0537 12/02/13 0814 12/02/13  0850  BP: 105/57 106/60  125/82  Pulse: 83 79  82  Temp: 98.9 F (37.2 C) 98.7 F (37.1 C)  98.3 F (36.8 C)  TempSrc: Oral Oral  Oral  Resp: 16 16 16 20   Height:      Weight:      SpO2: 97% 100%  98%    Wt Readings from Last 3 Encounters:  11/30/13 61.871 kg (136 lb 6.4 oz)     Intake/Output Summary (Last 24 hours) at 12/02/13 1050 Last data filed at 12/02/13 0814  Gross per 24 hour  Intake      0 ml  Output   2000 ml  Net  -2000 ml    Exam  General: Well developed, well nourished, NAD, appears stated age  HEENT: NCAT, PERRLA, EOMI, Anicteic Sclera, mucous membranes moist.   Neck: Supple, no JVD, no masses  Cardiovascular: S1 S2 auscultated, no rubs, murmurs or gallops. Regular rate and rhythm.  Respiratory: Clear to auscultation bilaterally with equal chest rise  Abdomen: Soft, nontender, nondistended, + bowel sounds  Extremities: warm dry without cyanosis clubbing or edema  Neuro: Awake and alert, only oriented to self.  Follows few commands.  Difficult to examine eyes.  Moves all limbs, but cannot assess strength.    Data Review   Micro Results No results found for this or any previous visit (from the past 240 hour(s)).  Radiology Reports Dg Eye Foreign Body  12/01/2013   CLINICAL DATA:  Metal working/exposure; clearance prior to MRI  EXAM: ORBITS FOR FOREIGN BODY - 2 VIEW  COMPARISON:  None.  FINDINGS: There is no evidence of metallic foreign body within the orbits. No significant bone abnormality identified.  IMPRESSION: No evidence of metallic foreign body within the orbits.   Electronically Signed   By: Sebastian Ache   On: 12/01/2013 12:48   Dg Chest 2 View  11/30/2013   CLINICAL DATA:  Altered mental status. Demented. Diabetic. Hypertension.  EXAM: CHEST  2 VIEW  COMPARISON:  None.  FINDINGS: Lateral view degraded by patient arm position. Lateral view is also oblique. Artifact degradation posteriorly.  Midline trachea. Borderline cardiomegaly, with a  tortuous thoracic aorta. No pleural effusion or pneumothorax. Increased density at the lung bases, with suggestion of increased retrocardiac density on the lateral view. Upper lungs clear.  IMPRESSION: Bibasilar increased density. This could represent dependent atelectasis superimposed upon chronic interstitial thickening. However, early infection or aspiration cannot be excluded. Consider short term radiographic followup.  Multifactorial degradation involving the lateral view.   Electronically Signed   By: Jeronimo Greaves M.D.   On: 11/30/2013 16:37   Dg Abd 1 View  12/01/2013   CLINICAL DATA:  Pre MRI clearing films.  EXAM: ABDOMEN - 1 VIEW  COMPARISON:  None.  FINDINGS: No radiopaque foreign bodies. Specifically, there is no evidence of a metallic foreign  body. Normal bowel gas pattern. Soft tissues are unremarkable. There are mild degenerative changes along the visualized spine. Bones are demineralized.  IMPRESSION: No acute findings.  No metallic foreign bodies.   Electronically Signed   By: Amie Portland M.D.   On: 12/01/2013 12:46   Ct Head Wo Contrast  11/30/2013   CLINICAL DATA:  Altered mental status.  Diabetic.  Demented.  EXAM: CT HEAD WITHOUT CONTRAST  TECHNIQUE: Contiguous axial images were obtained from the base of the skull through the vertex without intravenous contrast.  COMPARISON:  03/03/2007  FINDINGS: Sinuses/Soft tissues: Clear paranasal sinuses and mastoid air cells.  Intracranial: Moderate low density in the periventricular white matter likely related to small vessel disease. No mass lesion, hemorrhage, hydrocephalus, acute infarct, intra-axial, or extra-axial fluid collection.  IMPRESSION: No acute intracranial abnormality.   Electronically Signed   By: Jeronimo Greaves M.D.   On: 11/30/2013 16:40   Mr Maxine Glenn Head Wo Contrast  12/02/2013   CLINICAL DATA:  Acute encephalopathy. Altered mental status. Dementia and diabetes.  EXAM: MRA HEAD WITHOUT CONTRAST  TECHNIQUE: Angiographic images of  the Circle of Willis were obtained using MRA technique without intravenous contrast.  COMPARISON:  MRI brain 12/01/2013  FINDINGS: Both internal carotid arteries are widely patent through the skullbase. No siphon stenosis. The anterior and middle cerebral vessels are patent without proximal stenosis, aneurysm or vascular malformation.  Both vertebral arteries are widely patent to the basilar. No basilar stenosis. Posterior circulation branch vessels appear normal.  IMPRESSION:  Normal intracranial MR angiography of the large and medium size vessels.  I think it is debatable if the pontine changes on yesterday's study represent recent infarction or T2 shine through.   Electronically Signed   By: Paulina Fusi M.D.   On: 12/02/2013 10:09   Mri Brain Without Contrast  12/01/2013   CLINICAL DATA:  Acute encephalopathy. History dementia and diabetes.  EXAM: MRI HEAD WITHOUT CONTRAST  TECHNIQUE: Multiplanar, multiecho pulse sequences of the brain and surrounding structures were obtained without intravenous contrast.  COMPARISON:  Head CT 11/30/2013  FINDINGS: There are small, acute infarcts involving the left greater than right pons. Punctate foci of restricted diffusion versus artifact are questioned along the right and left lateral aspects of the splenium of the corpus callosum. No acute large territory supratentorial infarct is seen. There is no intracranial hemorrhage, mass, midline shift, or extra-axial fluid collection. There is mild-to-moderate generalized cerebral atrophy. T2 hyperintensities in the cerebral white matter and pons are nonspecific but compatible with mild chronic small vessel ischemic disease.  Orbits are unremarkable. Paranasal sinuses are clear. There may be trace bilateral mastoid effusions. Major intracranial vascular flow voids are preserved.  IMPRESSION: 1. Small, acute pontine infarcts. 2. Artifact versus punctate infarcts along the lateral margins of the splenium of the corpus callosum.    Electronically Signed   By: Sebastian Ache   On: 12/01/2013 13:34    CBC  Recent Labs Lab 11/30/13 1603 11/30/13 2251  WBC 6.5 8.8  HGB 13.0 12.4*  HCT 40.2 38.5*  PLT 214 206  MCV 92.8 91.0  MCH 30.0 29.3  MCHC 32.3 32.2  RDW 12.5 12.4  LYMPHSABS 1.2 1.8  MONOABS 0.4 0.7  EOSABS 0.0 0.0  BASOSABS 0.0 0.0    Chemistries   Recent Labs Lab 11/30/13 1603 11/30/13 2251  NA 135* 137  K 4.9 4.1  CL 99 101  CO2 23 23  GLUCOSE 261* 160*  BUN 20 14  CREATININE 0.37*  0.32*  CALCIUM 9.4 9.2  AST 37 25  ALT 38 35  ALKPHOS 70 68  BILITOT 0.3 0.4   ------------------------------------------------------------------------------------------------------------------ estimated creatinine clearance is 67.7 ml/min (by C-G formula based on Cr of 0.32). ------------------------------------------------------------------------------------------------------------------  Recent Labs  11/30/13 2135  HGBA1C 6.7*   ------------------------------------------------------------------------------------------------------------------  Recent Labs  12/01/13 1104  CHOL 142  HDL 56  LDLCALC 75  TRIG 56  CHOLHDL 2.5   ------------------------------------------------------------------------------------------------------------------  Recent Labs  12/02/13 0555  TSH 0.400   ------------------------------------------------------------------------------------------------------------------ No results found for this basename: VITAMINB12, FOLATE, FERRITIN, TIBC, IRON, RETICCTPCT,  in the last 72 hours  Coagulation profile No results found for this basename: INR, PROTIME,  in the last 168 hours  No results found for this basename: DDIMER,  in the last 72 hours  Cardiac Enzymes  Recent Labs Lab 11/30/13 2135 12/01/13 0516 12/01/13 1104  TROPONINI <0.30 <0.30 <0.30   ------------------------------------------------------------------------------------------------------------------ No  components found with this basename: POCBNP,     Yazhini Mcaulay D.O. on 12/02/2013 at 10:50 AM  Between 7am to 7pm - Pager - [phone]  After 7pm go to [email or web address] - password [email or web address]  And look for the night coverage person covering for me after hours  [facility] Office  [phone]

## 2013-12-02 NOTE — Progress Notes (Signed)
INITIAL NUTRITION ASSESSMENT  DOCUMENTATION CODES Per approved criteria  -Not Applicable   INTERVENTION: Diet advancement per MD/SLP RD to continue to monitor and provide interventions as needed  NUTRITION DIAGNOSIS: Predicted sub optimal energy intake related to recent Acute CVA as evidenced by current NPO status.   Goal: Pt to meet >/= 90% of their estimated nutrition needs   Monitor:  Diet advancement, PO intake, weight trend, labs  Reason for Assessment: Malnutrition Screening Tool (MST), score of 2  79 y.o. male  Admitting Dx: Unspecified cerebral artery occlusion with cerebral infarction  ASSESSMENT: 79 y.o. male with history of dementia, diabetes mellitus (off medications for last 6 months), hypertension and hyperlipidemia was brought to the ER after patient's sister found the patient suddenly slumped to the left side while sitting on the chair at around 2:30 PM on the day of admission. [facility] MRI of brain showed acute CVA. [facility]   Pt identified due to MST score but, appears score filed inaccurately-  pt denied recent weight loss and poor PO intake. However pt at nutrition risk due to failed swallow evaluation and  NPO status; SLP recommended MBS. Pt was out of room at time of visit. No weight history on file.  Labs reviewed. Lipid panel WNL.   Height: Ht Readings from Last 1 Encounters:  11/30/13 5\' 8"  (1.727 m)    Weight: Wt Readings from Last 1 Encounters:  11/30/13 136 lb 6.4 oz (61.871 kg)    Ideal Body Weight: 154 lbs  % Ideal Body Weight: 88%  Wt Readings from Last 10 Encounters:  11/30/13 136 lb 6.4 oz (61.871 kg)    Usual Body Weight: unknown  % Usual Body Weight: NA  BMI:  Body mass index is 20.74 kg/(m^2).  Estimated Nutritional Needs: Kcal: 1550-1750 Protein: 75-85 grams Fluid: 1.7 L/day  Skin: intact  Diet Order: NPO  EDUCATION NEEDS: -No education needs  identified at this time   Intake/Output Summary (Last 24 hours) at 12/02/13 1125 Last data filed at 12/02/13 0814  Gross per 24 hour  Intake      0 ml  Output   2000 ml  Net  -2000 ml    Last BM: PTA  Labs:   Recent Labs Lab 11/30/13 1603 11/30/13 2251  NA 135* 137  K 4.9 4.1  CL 99 101  CO2 23 23  BUN 20 14  CREATININE 0.37* 0.32*  CALCIUM 9.4 9.2  GLUCOSE 261* 160*    CBG (last 3)   Recent Labs  12/01/13 2016 12/02/13 0040 12/02/13 0417  GLUCAP 133* 103* 111*    Scheduled Meds: . antiseptic oral rinse  7 mL Mouth Rinse q12n4p  . aspirin  300 mg Rectal Daily  . chlorhexidine  15 mL Mouth Rinse BID  . enoxaparin (LOVENOX) injection  40 mg Subcutaneous Q24H  . insulin aspart  0-9 Units Subcutaneous 6 times per day    Continuous Infusions:   Past Medical History  Diagnosis Date  . Diabetes mellitus without complication   . Hypertension   . Alzheimer disease     History reviewed. No pertinent past surgical history.  Ian Malkineanne Barnett RD, LDN Inpatient Clinical Dietitian Pager: [phone] After Hours Pager: [phone]

## 2013-12-02 NOTE — Progress Notes (Signed)
STROKE TEAM PROGRESS NOTE   HISTORY Eric Cameron is an 79 y.o. male with a past medical history significant for HTN, DM, hyperlipidemia, and advanced dementia Alzheimer type, transferred to [facility] for further evaluation and management of pontine infarct. Patient has advanced dementia and can not reliably contribute to his clinical history, hence all data was obtained from his chart. He was initially admitted to [facility] on 8/15: " he was found slumped to the left side", and brought to the ER for evaluation along with this as well as rigidity and unresponsiveness for 10 seconds. He was also felt to be more confused than baseline. Time last known well unknown. CT on admission was negative for acute findings. However, a brain MRI obtained today disclosed the presence of small, acute pontine infarcts involving the left greater than the right pons. At this time, he open his eyes to verbal commands but doesn't engage or conversation or follows complex commands. Earlier today he was described as combative and was kicking at RN while being assessed but once assessment was complete pt settled back down.  Cholesterol 142, HDL 56, triglycerides 56, LDL 75.  Patient was not administered TPA secondary to delay in arrival. Neuro was consulted for further evaluation and treatment. He was them transffered to the stroke team.   SUBJECTIVE (INTERVAL HISTORY) Eric Cameron is at the bedside.  Overall he feels his condition is stable. No active complains. Eric Cameron said he was sitting in the table on the day of admission, had one episode of LOC and slumped to his left side for about 10 sec. EMS called, he was bring to ER. In ER, he had two episodes with head shaking and eye either close or open for 5 sec each. No body shaking, or tongue biting or b/b dysfunction. MRI showed pontine stroke. Since then, he was stable at his baseline, no recurrent episodes.   He passed swallow today, on dysphagia diet with thin liquid.   OBJECTIVE Temp:  [98.3 F  (36.8 C)-98.9 F (37.2 C)] 98.3 F (36.8 C) (08/17 0850) Pulse Rate:  [63-83] 82 (08/17 0850) Resp:  [12-20] 20 (08/17 0850) BP: (92-125)/(39-82) 125/82 mmHg (08/17 0850) SpO2:  [95 %-100 %] 98 % (08/17 0850)  Mri Brain Without Contrast 12/01/2013   IMPRESSION: 1. Small, acute pontine infarcts. 2. Artifact versus punctate infarcts along the lateral margins of the splenium of the corpus callosum.     MRA head and neck Normal intracranial MR angiography of the large and medium size vessels.  2D echo Left ventricle: The cavity size was normal. Wall thickness was normal. Systolic function was vigorous. The estimated ejection fraction was in the range of 65% to 70%. Wall motion was normal; there were no regional wall motion abnormalities. Doppler parameters are consistent with abnormal left ventricular relaxation (grade 1 diastolic dysfunction). The E/e&' ratio is between 8-15, suggesting indeterminate LV filling pressure. - Aortic valve: There was trivial regurgitation. - Mitral valve: Mildly thickened leaflets . There was trivial regurgitation. - Left atrium: The atrium was normal in size. Impressions: - LVEF 65-70%, normal wall thickness, normal chamber sizes, diastolic dysfunction with indeterminate LV Filling pressure.  Carotid doppler pending  EEG pending report  PHYSICAL EXAM Physical exam  Temp:  [98.3 F (36.8 C)-98.9 F (37.2 C)] 98.4 F (36.9 C) (08/17 1843) Pulse Rate:  [72-83] 79 (08/17 1843) Resp:  [14-20] 20 (08/17 1843) BP: (104-125)/(53-82) 113/56 mmHg (08/17 1843) SpO2:  [93 %-100 %] 93 % (08/17 1843)  General - thin build, well  developed, in no apparent distress.  Ophthalmologic - not cooperative on exam.  Cardiovascular - Regular rate and rhythm with no murmur.  Neuro exam - limited due to not cooperative on exam due to hard of hearing and cognitive deficit. He knows his name and his Eric Cameron name, and hospital, but does not know the year or date or  president. Able to name object, but not able to name the part of object, able to repeat. Visual field full, PERRL, EOMI, no nystagmus, facial symmetrical, tongue in middle. 5-/5 in all extremities, no drift. Sensation symmetrical, not able to do FTN or HTS. Gait not tested.   ASSESSMENT/PLAN  Mr. Eric Cameron is a 79 y.o. male with hx demential presenting with altered mental status. MRI posive for pontine infarcts. He did not receive IV t-PA due to delay in arrival. Stroke work up underway.  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 Since he passed swallow, will change to ASA 325 mg daily (done).  MRI small pontine infarcts  MRA normal  2D Echo unremarkable   Carotid pending  HgbA1c 6.7, goal less than 7.0  lovenox for VTE prophylaxis  Passed swallow today on dysphagia diet III and thin liquid  Up with assistance  Therapy needs:  Home health PT/OT  Presentation is not typical for stroke, EEG done and pending report  Hypertension   Home meds:  Amlodipine 5 / benazapril 10 daily. Not resumed in hospital yet  BP 92-106/39-47 past 24h  SBP goal 130/80  Stable  Hyperlipidemia  LDL 75   Patient on lipitor 20 at home, continued in hospital (ordered)  LDL goal <70 for diabetics  Diabetes  HgbA1c 6.7   Controlled  Goal < 7.0  Other Stroke Risk Factors  Advanced age  Dementia  Baseline Alzheimer's disease, resume namenda (ordered)  Other Pertinent History  DNR  Hospital day # 2  Marvel PlanJindong Aletheia Tangredi, MD PhD Stroke Neurology 12/02/2013 7:51 PM    To contact Stroke Continuity provider, please refer to [email or web address]. After hours, contact General Neurology

## 2013-12-02 NOTE — Procedures (Signed)
ELECTROENCEPHALOGRAM REPORT  Date of Study: 12/02/2013  Patient's Name: Eric CopaRobert Cameron MRN: [id number] Date of Birth: November 12, 1934  Referring Provider: Dr. Midge MiniumArshad Kakrakandy  Clinical History: this is a 79 year old man with severe dementia admitted for her increased confusion, rigidity and unresponsiveness for 10 seconds.MRI showed the acute infarct in the pons left grea  Medications: Aspirin, hydralazine, Lovenox, insulin  Technical Summary: A multichannel digital EEG recording measured by the international 10-20 system with electrodes applied with paste and impedances below 5000 ohms performed in our laboratory with EKG monitoring in an awake and asleep patient.  Hyperventilation and photic stimulation were not performed.  The digital EEG was referentially recorded, reformatted, and digitally filtered in a variety of bipolar and referential montages for optimal display.   Description: The patient is awake and clinically asleep during the recording. During maximal wakefulness, there is a poorly sustained 8 Hz posterior dominant rhythm that poorly attenuates to eye opening and eye closure.  During drowsiness and sleep, there is an increase in theta slowing of the background. Deeper stages of sleep are not seen.  There is frequent focal 4-5 Hz theta slowing seen over the left temporal region, and occasional independent focal 4-5 Hz theta slowing over the right temporal region.  There are occasional sharp waves over the left frontotemporal region.  There were no electrographic seizures seen.    EKG lead was unremarkable.  Impression: This awake and asleep EEG is abnormal due to the presence of: 1. Independent focal slowing over the bilateral temporal regions, left greater than right 2. Occasional sharp waves over the left frontotemporal region  Clinical Correlation of the above findings indicates focal cerebral dysfunction over the bilateral temporal region, left greater than right, suggestive  of underlying structural or physiologic abnormality.  There is a focal tendency for seizures to arise from the left temporal region.  There were no electrographic seizures seen in this study.   Eric Cameron, M.D.

## 2013-12-02 NOTE — Evaluation (Signed)
Clinical/Bedside Swallow Evaluation Patient Details  Name: Eric Cameron MRN: [id number] Date of Birth: 1934-08-05  Today's Date: 12/02/2013 Time: 0935-1005 SLP Time Calculation (min): 30 min  Past Medical History:  Past Medical History  Diagnosis Date  . Diabetes mellitus without complication   . Hypertension   . Alzheimer disease    Past Surgical History: History reviewed. No pertinent past surgical history. HPI:  Patient is a 79 y.o. male with a past medical history significant for HTN, DM, hyperlipidemia, and advanced dementia Alzheimer type who [facility] after being found slumped to the left side with more confusion. [facility] CT without acute findings, however, MRI showed small, acute pontine infarcts involving the left greater than the right pons.     Assessment / Plan / Recommendation Clinical Impression  Pt administered a BSE and was alert and cooperative throughout. Pt was administered a limited oral-motor exam due to cognitive impairments, however, his overall lingual and labial musculature strength and ROM appeared WFL. Pt administered trials of ice chips and thin liquids. Pt demonstrated delayed AP transit with a suspected delayed swallow initiation with overt throat clear and delayed cough after trials of thin. Pt then refused further trials. Due to patient's h/o Alzheimer's disease and current pontine infarcts, recommend patient remain NPO and be administereed a MBS to assess possible dysphagia and safest diet recommendation. Attempted to call the patient's caregiver (sister) for information in regards to his PLOF, however, her phone number had been disconnected.     Aspiration Risk  Moderate   Diet Recommendation NPO        Other  Recommendations Recommended Consults: MBS Oral Care Recommendations: Oral care Q4 per protocol   Follow Up Recommendations  24 hour supervision/assistance (TBD)    Frequency and  Duration min 2x/week  2 weeks   Pertinent Vitals/Pain N/A    SLP Swallow Goals     Swallow Study    General Date of Onset: 11/30/13 HPI: Patient is a 79 y.o. male with a past medical history significant for HTN, DM, hyperlipidemia, and advanced dementia Alzheimer type who [facility] after being found slumped to the left side with more confusion. [facility] CT without acute findings, however, MRI showed small, acute pontine infarcts involving the left greater than the right pons.   Type of Study: Bedside swallow evaluation Previous Swallow Assessment: N/A Diet Prior to this Study: NPO Temperature Spikes Noted: No Respiratory Status: Room air Behavior/Cognition: Requires cueing;Confused;Pleasant mood;Cooperative Oral Cavity - Dentition: Poor condition;Missing dentition Self-Feeding Abilities: Able to feed self;Needs assist Patient Positioning: Upright in bed Baseline Vocal Quality: Clear Volitional Cough: Strong Volitional Swallow: Able to elicit    Oral/Motor/Sensory Function Overall Oral Motor/Sensory Function: Appears within functional limits for tasks assessed (limited due to cognitive function, however, overall appeared WFL)   Ice Chips Ice chips: Impaired Presentation: Self Fed;Spoon Oral Phase Functional Implications: Prolonged oral transit Pharyngeal Phase Impairments: Suspected delayed Swallow   Thin Liquid Thin Liquid: Impaired Presentation: Self Fed;Spoon;Cup Pharyngeal  Phase Impairments: Suspected delayed Swallow;Throat Clearing - Immediate;Throat Clearing - Delayed    Nectar Thick Nectar Thick Liquid: Not tested Other Comments: Refused continued trials of liquids   Honey Thick Honey Thick Liquid: Not tested   Puree Puree: Not tested   Solid   GO     Feliberto Gottronourtney Jaykob Minichiello, MA, CCC-SLP [phone]  Solid: Not tested  Ammaar Encina 12/02/2013,10:20 AM

## 2013-12-02 NOTE — Evaluation (Signed)
Physical Therapy Evaluation Patient Details Name: Eric Cameron MRN: [id number] DOB: 11-20-1934 Today's Date: 12/02/2013   History of Present Illness  Eric Cameron is a 79 y.o. male with history of dementia, diabetes mellitus (off medications for last 6 months), hypertension and hyperlipidemia was brought to the ER after patient's sister found the patient suddenly slumped to the left side while sitting on the chair at around 2:30 PM. Patient also became rigid and unresponsive for less than 10 seconds. Following which patient was looking more confused. Patient was brought to the ER and had a similar episode in the ER which lasted for few seconds. After the incident patient per the family looks more confused than usual and was also tachycardic during the episode with rates going up to 140 per minute. Per MRI - Small, acute pontine infarcts.  Clinical Impression  Pt admitted with pons infarcts. Pt currently with functional limitations due to the deficits listed below (see PT Problem List). Sister states she can provide 24 hour care but also states that if NH short term would help pt she is for that as well.  Need to assess pt with RW and determine if pt can function well with it and if so should be able to go home with sister.  Pt will benefit from skilled PT to increase their independence and safety with mobility to allow discharge to the venue listed below.     Follow Up Recommendations Home health PT;Supervision/Assistance - 24 hour (NHP for short term therapy is no 24 hour care initially at home)    Equipment Recommendations  Rolling walker with 5" wheels    Recommendations for Other Services       Precautions / Restrictions Precautions Precautions: Fall Restrictions Weight Bearing Restrictions: No      Mobility  Bed Mobility Overal bed mobility: Modified Independent                Transfers Overall transfer level: Needs assistance Equipment used: None Transfers: Sit  to/from Stand Sit to Stand: Min guard         General transfer comment: Patient with difficulty maintain dynamic standing during functional transfers and functional ambulation  Ambulation/Gait Ambulation/Gait assistance: Min assist;Mod assist Ambulation Distance (Feet): 250 Feet Assistive device: None Gait Pattern/deviations: Step-to pattern;Decreased stride length;Ataxic;Staggering left;Staggering right;Narrow base of support   Gait velocity interpretation: at or above normal speed for age/gender General Gait Details: Pt gets distracted easily and when he does he tends to lose balance and needed min assist. Pt unsteady at other times as well as he is slightly impulsive and has some coordination and strength issues on left LE.  Sister states that he did not have balance issues PTA.  Will try RW on next visit to see if pt can use this functionally.   Stairs            Wheelchair Mobility    Modified Rankin (Stroke Patients Only) Modified Rankin (Stroke Patients Only) Pre-Morbid Rankin Score: Moderate disability Modified Rankin: Moderately severe disability     Balance Overall balance assessment: Needs assistance;History of Falls         Standing balance support: No upper extremity supported;During functional activity Standing balance-Leahy Scale: Fair Standing balance comment: Needed support with dynamic activities but could stand statically without physical assist.                  Standardized Balance Assessment Standardized Balance Assessment : Dynamic Gait Index   Dynamic Gait Index Level Surface: Moderate  Impairment Change in Gait Speed: Mild Impairment Gait with Horizontal Head Turns: Moderate Impairment Gait with Vertical Head Turns: Moderate Impairment Gait and Pivot Turn: Moderate Impairment Step Over Obstacle: Moderate Impairment Step Around Obstacles: Moderate Impairment Steps: Mild Impairment Total Score: 10       Pertinent Vitals/Pain  Pain Assessment: No/denies pain VSS    Home Living Family/patient expects to be discharged to:: Private residence Living Arrangements: Other relatives (attends adult day care during the day, per sister) Available Help at Discharge: Family (sister is available at night ) Type of Home: House Home Access: Level entry     Home Layout: One level Home Equipment: Shower seat;Bedside commode Additional Comments:  (Sister present to provide information)    Prior Function Level of Independence: Independent         Comments:  (Patient attend Adult Day Care during daytime)     Hand Dominance   Dominant Hand: Left    Extremity/Trunk Assessment   Upper Extremity Assessment: Defer to OT evaluation           Lower Extremity Assessment: Generalized weakness      Cervical / Trunk Assessment: Normal  Communication   Communication: Receptive difficulties;Expressive difficulties  Cognition Arousal/Alertness: Awake/alert Behavior During Therapy: WFL for tasks assessed/performed Overall Cognitive Status: History of cognitive impairments - at baseline Area of Impairment: Following commands;Safety/judgement;Awareness;Problem solving     Memory: Decreased short-term memory Following Commands: Follows one step commands with increased time Safety/Judgement: Decreased awareness of safety;Decreased awareness of deficits   Problem Solving: Slow processing;Difficulty sequencing;Requires verbal cues;Requires tactile cues      General Comments General comments (skin integrity, edema, etc.): Scored 10/24 on DGI suggesting pt at risk of falls without device.      Exercises        Assessment/Plan    PT Assessment Patient needs continued PT services  PT Diagnosis Generalized weakness   PT Problem List Decreased activity tolerance;Decreased balance;Decreased mobility;Decreased knowledge of use of DME;Decreased safety awareness;Decreased strength;Decreased coordination;Decreased  cognition  PT Treatment Interventions DME instruction;Gait training;Functional mobility training;Therapeutic activities;Therapeutic exercise;Balance training;Patient/family education   PT Goals (Current goals can be found in the Care Plan section) Acute Rehab PT Goals Patient Stated Goal: pt did not state but sister states to be at home PT Goal Formulation: With patient Time For Goal Achievement: 12/09/13 Potential to Achieve Goals: Good    Frequency Min 4X/week   Barriers to discharge Decreased caregiver support PTA Adult day care and sister at night - needs 24 hour care    Co-evaluation               End of Session Equipment Utilized During Treatment: Gait belt Activity Tolerance: Patient limited by fatigue Patient left: in chair;with call bell/phone within reach;with chair alarm set;with family/visitor present Nurse Communication: Mobility status         Time: 1526-1550 PT Time Calculation (min): 24 min   Charges:   PT Evaluation $Initial PT Evaluation Tier I: 1 Procedure PT Treatments $Gait Training: 8-22 mins   PT G Codes:          INGOLD,Virgie Chery 12/02/2013, 4:22 PM [facility],PT Acute Rehabilitation [phone] (pager)

## 2013-12-02 NOTE — Progress Notes (Signed)
  Echocardiogram 2D Echocardiogram has been performed.  Cathie BeamsGREGORY, Jamyria Ozanich 12/02/2013, 12:39 PM

## 2013-12-02 NOTE — Evaluation (Signed)
Occupational Therapy Evaluation Patient Details Name: Eric Cameron MRN: [id number] DOB: 18-May-1934 Today's Date: 12/02/2013    History of Present Illness Eric Cameron is a 79 y.o. male with history of dementia, diabetes mellitus (off medications for last 6 months), hypertension and hyperlipidemia was brought to the ER after patient's sister found the patient suddenly slumped to the left side while sitting on the chair at around 2:30 PM. Patient also became rigid and unresponsive for less than 10 seconds. Following which patient was looking more confused. Patient was brought to the ER and had a similar episode in the ER which lasted for few seconds. After the incident patient per the family looks more confused than usual and was also tachycardic during the episode with rates going up to 140 per minute. Per MRI - Small, acute pontine infarcts.   Clinical Impression   Patient admitted with above. Patient independent PTA. Currently functioning at an overall min guard assist level due to impaired balance. Feel patient will benefit from acute OT to enhance overall independence in the areas of BADLS and functional mobility. Unable to fully assess vision secondary to cognitive impairments, plan to assess in more functional setting. Patient demonstrated difficulty with following multiple step commands and occasional one step commands. Patient's sister present during session and feels that patient is at baseline for cognition. Believe patient will benefit from [facility] in order to get back to his PLOF. Per sister, patient stayed at an adult day care during the week, sister is home during the evenings. Anticipate, patient will require 24/7 supervision/assist once discharged > home.      Follow Up Recommendations  Home health OT;Supervision/Assistance - 24 hour    Equipment Recommendations  None recommended by OT (per sister report, has a shower seat and BSC)    Recommendations for Other Services  none at  this time      Precautions / Restrictions Precautions Precautions: None Restrictions Weight Bearing Restrictions: No      Mobility Bed Mobility Overal bed mobility: Modified Independent                Transfers Overall transfer level: Needs assistance Equipment used: None Transfers: Sit to/from Stand Sit to Stand: Min guard         General transfer comment: Patient with difficulty maintain dynamic standing during functional transfers and functional ambulation    Balance  Defer to PT evaluation                                          ADL Overall ADL's : Needs assistance/impaired     Grooming: Min guard;Standing;Cueing for safety                   Toilet Transfer: Min guard;Comfort height toilet;Ambulation             General ADL Comments: Patient able to engage in functional ambulation without use of AE with min guard assist. Patient required min verbal cues for safety during functional mobility. Patient performed toielt transfer with min guard assist and grooming tasks with min guard in standing. Patient inconsitently followed multiple step commands     Vision                 Additional Comments: Unable to fully assess due to cognitive impairments          Pertinent Vitals/Pain Pain Assessment: No/denies pain  Hand Dominance Left   Extremity/Trunk Assessment Upper Extremity Assessment Upper Extremity Assessment: Overall WFL for tasks assessed (difficult to assess due to pre-morbid cognitive impairments)   Lower Extremity Assessment Lower Extremity Assessment: Defer to PT evaluation   Cervical / Trunk Assessment Cervical / Trunk Assessment: Normal   Communication Communication Communication: Receptive difficulties;Expressive difficulties   Cognition Arousal/Alertness: Awake/alert Behavior During Therapy: WFL for tasks assessed/performed Overall Cognitive Status: History of cognitive impairments - at  baseline       Memory: Decreased short-term memory                        Home Living Family/patient expects to be discharged to:: Private residence Living Arrangements: Other relatives (attends adult day care during the day, per sister) Available Help at Discharge: Family (sister is available at night ) Type of Home: House Home Access: Level entry     Home Layout: One level     Bathroom Shower/Tub: Producer, television/film/videoWalk-in shower   Bathroom Toilet: Standard     Home Equipment: Shower seat;Bedside commode   Additional Comments:  (Sister present to provide information)      Prior Functioning/Environment Level of Independence: Independent        Comments:  (Patient attend Adult Day Care during daytime)    OT Diagnosis: Generalized weakness;Cognitive deficits   OT Problem List: Decreased activity tolerance;Impaired balance (sitting and/or standing);Decreased coordination;Decreased cognition;Decreased safety awareness   OT Treatment/Interventions: Self-care/ADL training;Therapeutic exercise;Energy conservation;DME and/or AE instruction;Therapeutic activities;Patient/family education;Balance training    OT Goals(Current goals can be found in the care plan section) Acute Rehab OT Goals Patient Stated Goal:  (none stated) OT Goal Formulation: With family Time For Goal Achievement: 12/09/13 Potential to Achieve Goals: Good ADL Goals Pt Will Perform Grooming: Independently;standing Pt Will Perform Upper Body Bathing: Independently;sitting Pt Will Perform Lower Body Bathing: Independently;sit to/from stand Pt Will Perform Upper Body Dressing: Independently;sitting Pt Will Perform Lower Body Dressing: Independently;sit to/from stand Pt Will Transfer to Toilet: with modified independence;ambulating Pt Will Perform Toileting - Clothing Manipulation and hygiene: with modified independence;sit to/from stand Pt Will Perform Tub/Shower Transfer: with supervision;ambulating;shower seat   OT Frequency: Min 2X/week   Barriers to D/C: Decreased caregiver support          End of Session Equipment Utilized During Treatment: Gait belt  Activity Tolerance: Patient tolerated treatment well Patient left: with family/visitor present (and with PT)   Time: 9528-41321508-1530 OT Time Calculation (min): 22 min Charges:  OT General Charges $OT Visit: 1 Procedure OT Evaluation $Initial OT Evaluation Tier I: 1 Procedure OT Treatments $Self Care/Home Management : 8-22 mins G-Codes:    Bertice Risse , MS, OTR/L, CLT Pager: [phone] 12/02/2013, 4:11 PM

## 2013-12-02 NOTE — Progress Notes (Signed)
EEG Completed; Results Pending  

## 2013-12-03 DIAGNOSIS — R569 Unspecified convulsions: Secondary | ICD-10-CM

## 2013-12-03 LAB — GLUCOSE, CAPILLARY
GLUCOSE-CAPILLARY: 145 mg/dL — AB (ref 70–99)
Glucose-Capillary: 147 mg/dL — ABNORMAL HIGH (ref 70–99)
Glucose-Capillary: 212 mg/dL — ABNORMAL HIGH (ref 70–99)
Glucose-Capillary: 324 mg/dL — ABNORMAL HIGH (ref 70–99)

## 2013-12-03 MED ORDER — LACOSAMIDE 50 MG PO TABS: 50.0000 mg | ORAL_TABLET | Freq: Two times a day (BID) | ORAL | Status: DC

## 2013-12-03 MED ORDER — LACOSAMIDE 200 MG/20ML IV SOLN
50.0000 mg | Freq: Two times a day (BID) | INTRAVENOUS | Status: DC
  Administered 2013-12-03: 50 mg via INTRAVENOUS
  Filled 2013-12-03 (×4): qty 5

## 2013-12-03 MED ORDER — ASPIRIN 325 MG PO TABS
325.0000 mg | ORAL_TABLET | Freq: Every day | ORAL | Status: AC
Start: 2013-12-03 — End: ?

## 2013-12-03 NOTE — Progress Notes (Signed)
Discharge orders received. Patients walker was delivered, reviewed discharge instructions, medications, and follow up information with family member at bedside. Family given script for vimpat. Walker delivered to bedside. Patient discharged home via wheelchair at 1730 Eric Cameron, Eric Cameron

## 2013-12-03 NOTE — Progress Notes (Signed)
STROKE TEAM PROGRESS NOTE   HISTORY Eric Cameron is an 79 y.o. male with a past medical history significant for HTN, DM, hyperlipidemia, and advanced dementia Alzheimer type, transferred to [facility] for further evaluation and management of pontine infarct. Patient has advanced dementia and can not reliably contribute to his clinical history, hence all data was obtained from his chart. He was initially admitted to [facility] on 8/15: " he was found slumped to the left side", and brought to the ER for evaluation along with this as well as rigidity and unresponsiveness for 10 seconds. He was also felt to be more confused than baseline. Time last known well unknown. CT on admission was negative for acute findings. However, a brain MRI obtained today disclosed the presence of small, acute pontine infarcts involving the left greater than the right pons. At this time, he open his eyes to verbal commands but doesn't engage or conversation or follows complex commands. Earlier today he was described as combative and was kicking at RN while being assessed but once assessment was complete pt settled back down.  Cholesterol 142, HDL 56, triglycerides 56, LDL 75.  Patient was not administered TPA secondary to delay in arrival. Neuro was consulted for further evaluation and treatment. He was them transffered to the stroke team.   SUBJECTIVE (INTERVAL HISTORY) No family at bedside. Pt states he lives with his sister who helps him with his medications. He is more awake and alert than yesterday. EEG showed left tempofrontal sharps, concerning for potential focus of left temporal region.  OBJECTIVE Temp:  [97.6 F (36.4 C)-98.8 F (37.1 C)] 97.6 F (36.4 C) (08/18 0945) Pulse Rate:  [69-84] 84 (08/18 0945) Resp:  [18-20] 20 (08/18 0945) BP: (110-122)/(55-72) 116/61 mmHg (08/18 0945) SpO2:  [93 %-99 %] 99 % (08/18 0945)  Mri Brain Without Contrast 12/01/2013   IMPRESSION: 1. Small, acute pontine infarcts. 2. Artifact versus  punctate infarcts along the lateral margins of the splenium of the corpus callosum.     MRA head and neck Normal intracranial MR angiography of the large and medium size vessels.  2D echo  LVEF 65-70%, normal wall thickness, normal chamber sizes, diastolic dysfunction with indeterminate LV Filling pressure.   Carotid doppler No evidence of hemodynamically significant internal carotid artery stenosis. Vertebral artery flow is antegrade.   EEG This awake and asleep EEG is abnormal due to the presence of:  1. Independent focal slowing over the bilateral temporal regions, left greater than right 2. Occasional sharp waves over the left frontotemporal region   PHYSICAL EXAM  General - thin build, well developed, in no apparent distress.  Ophthalmologic - not cooperative on exam.  Cardiovascular - Regular rate and rhythm with no murmur.  Neuro exam - limited due to not cooperative on exam due to hard of hearing and cognitive deficit. He knows his name and his wife name, and hospital, but does not know the year or date or president. Able to name object, but not able to name the part of object, able to repeat. Visual field full, PERRL, EOMI, no nystagmus, facial symmetrical, tongue in middle. 5-/5 in all extremities, no drift. Sensation symmetrical, not able to do FTN or HTS. Gait not tested.   ASSESSMENT/PLAN  Eric Cameron is a 79 y.o. male with hx dementia presenting with altered mental status. Presentation not c/w stroke. EEG shows potential seizure focus. MRI positive for pontine infarcts vs T2 shine through. He did not receive IV t-PA due to delay in arrival.  Stroke work up completed.   Stroke:  Possible small pontine  thrombotic secondary to small vessel disease   Seizure focus   no antithrombotics prior to admission, now on aspirin 325 mg orally every day, continue at discharge  MRI small pontine infarcts vs T2 shine through  MRA normal  2D Echo unremarkable   Carotid normal    HgbA1c 6.7, at goal less than 7.0  lovenox for VTE prophylaxis  dysphagia diet III and thin liquid  Up with assistance  Therapy needs:  Home health PT/OT, RW 2/ 5" wheels  Presentation is not typical for stroke, EEG suggestive of seizure focus given left frontotemporal occasional sharp waves. Added vimpat 50 mg bid. Continue at discharge  Pt not driving. Told pt that he can not drive unless seizure free for 6 months and under physician care. He expressed understanding. Seizure precautions  Hypertension   Home meds:  Amlodipine 5 / benazapril 10 daily. Not resumed in hospital yet BP 110-125/55-82 past 24h   SBP goal 130/80  Stable  Hyperlipidemia  LDL 75   Patient on lipitor 20 at home, continue at discharge  LDL goal <70 for diabetics  Diabetes  HgbA1c 6.7   Controlled  At Goal < 7.0  Other Stroke Risk Factors  Advanced age  Dementia  Baseline Alzheimer's disease, resumed namenda   Other Pertinent History  DNR  Hospital day # 3  No further stroke workup indicated.  Ongoing risk factor control by Primary Care Physician  Stroke Service will sign off. Please call should any needs arise.  Follow up with Dr. Roda ShuttersXu, Stroke Clinic, in 2 months.  Annie MainSHARON BIBY, MSN, RN, ANVP-BC, ANP-BC, Lawernce IonGNP-BC [facility] Pager: [phone] 12/03/2013 11:20 AM  I, the attending vascular neurologist, have personally obtained a history, examined the patient, evaluated laboratory data, individually viewed imaging studies, and formulated the assessment and plan of care.  I have made any additions or clarifications directly to the above note and agree with the findings and plan as currently documented.   Marvel PlanJindong Marland Reine, MD PhD Stroke Neurology 12/03/2013 7:01 PM   To contact Stroke Continuity provider, please refer to [email or web address]. After hours, contact General Neurology

## 2013-12-03 NOTE — Progress Notes (Signed)
Attempted to call patients emergency contact [place] CallasEleanor [name], who I presume is his sister, at [phone] to inform her of patients order for discharge. Phone number has apparently been disconnected and is not working at this time. Patient has dementia and unable to tell me sisters number. No other numbers on file. Will wait and see if family shows up to visit with patient today. Reily Treloar, [place] Marie, RN 11:47 AM 12/03/2013

## 2013-12-03 NOTE — Care Management Note (Addendum)
  Page 1 of 1   12/03/2013     3:47:17 PM CARE MANAGEMENT NOTE 12/03/2013  Patient:  Eric Cameron,Eric Cameron   Account Number:  [id number]  Date Initiated:  12/03/2013  Documentation initiated by:  Elmer BalesOBARGE,Hope Brandenburger  Subjective/Objective Assessment:   Patient was admitted with CVA. Lives at home with his sister.     Action/Plan:   Will follow for discharge needs pending PT/OT evals and physician orders.   Anticipated DC Date:  12/03/2013   Anticipated DC Plan:  HOME W HOME HEALTH SERVICES         Choice offered to / List presented to:     DME arranged  [facility]      DME agency  [facility]        Status of service:   Medicare Important Message given?  YES (If response is "NO", the following Medicare IM given date fields will be blank) Date Medicare IM given:  12/03/2013 Medicare IM given by:  Elmer BalesOBARGE,Ava Tangney Date Additional Medicare IM given:   Additional Medicare IM given by:    Discharge Disposition:    Per UR Regulation:    If discussed at Long Length of Stay Meetings, dates discussed:    Comments:  12/03/13 1130 Elmer Balesourtney Daianna Vasques RN, MSN, CM- Spoke with patient's sister [place] Callasleanor [name] [phone] regarding home health orders.  Sister states that patient is in an adult daycare program daily. Patient will not be able to have home health therapy as well as adult daycare. Sister has chosen to continue with his adult daycare instead.   12/03/13 1045 Elmer Balesourtney Mathhew Buysse RN, MSN, CM- Attempted to reach patient's sister to discuss home health, as patient has dementia. The phone number listed in the chart is no longer active, patient does not know how to contact his sister.  Unit clerk and patient's RN are aware that CM is attempting to reach sister and will notify if sister calls or comes to visit the patient.  DME orders were [facility] for delivery prior to discharge home today.

## 2013-12-03 NOTE — Progress Notes (Signed)
Occupational Therapy Treatment Patient Details Name: Eric Cameron MRN: [id number] DOB: 1934-06-16 Today's Date: 12/03/2013    History of present illness Eric Cameron is a 79 y.o. male with history of dementia, diabetes mellitus (off medications for last 6 months), hypertension and hyperlipidemia was brought to the ER after patient's sister found the patient suddenly slumped to the left side while sitting on the chair at around 2:30 PM. Patient also became rigid and unresponsive for less than 10 seconds. Following which patient was looking more confused. Patient was brought to the ER and had a similar episode in the ER which lasted for few seconds. After the incident patient per the family looks more confused than usual and was also tachycardic during the episode with rates going up to 140 per minute. Per MRI - Small, acute pontine infarcts.   OT comments  Patient tolerated treatment well. At beginning of session, patient found supine in bed. Patient engaged in bed mobility, sat EOB and stood with min guard assist from therapist. Patient ambulated > therapy gym for focus on tub/shower transfer on/off shower seat. Patient required min guard during ambulation secondary to "fair" balance without UE support. Per PT note, patient exhibited unsafe behaviors when attempting to ambulate with RW. Patient's sister present at end of session and this OT reiterated importance of 24/7 supervision at this time. Discussed follow up HHOT with patient's sister as well. She seems receptive to these recommendations.    Follow Up Recommendations  Home health OT;Supervision/Assistance - 24 hour    Equipment Recommendations  None recommended by OT (per sister report, has a shower seat and BSC)    Recommendations for Other Services  none at this time.     Precautions / Restrictions Precautions Precautions: Fall Restrictions Weight Bearing Restrictions: No          Balance Overall balance assessment: Needs  assistance Sitting-balance support: No upper extremity supported Sitting balance-Leahy Scale: Fair     Standing balance support: No upper extremity supported Standing balance-Leahy Scale: Fair                     ADL Overall ADL's : Needs assistance/impaired                                 Tub/ Shower Transfer: Min guard     General ADL Comments: Patient engaged in bed mobility, functional ambulation without use of DME, and tub/shower transfer on/off shower seat. Patient overall min guard for tasks secondary to decreased dynamic standing balance.                 Cognition   Behavior During Therapy: WFL for tasks assessed/performed Overall Cognitive Status: History of cognitive impairments - at baseline Area of Impairment: Following commands;Awareness;Safety/judgement     Memory: Decreased short-term memory  Following Commands: Follows multi-step commands inconsistently Safety/Judgement: Decreased awareness of safety;Decreased awareness of deficits Awareness: Intellectual Problem Solving: Slow processing;Difficulty sequencing;Requires verbal cues;Requires tactile cues                   Pertinent Vitals/ Pain       Pain Assessment: No/denies pain         Frequency Min 2X/week     Progress Toward Goals  OT Goals(current goals can now be found in the care plan section)  Progress towards OT goals: Progressing toward goals     Plan Discharge plan remains appropriate  End of Session Equipment Utilized During Treatment: Gait belt   Activity Tolerance Patient tolerated treatment well   Patient Left in chair;with call bell/phone within reach;with chair alarm set;with family/visitor present     Time: 4098-11911500-1512 OT Time Calculation (min): 12 min  Charges: OT General Charges $OT Visit: 1 Procedure OT Treatments $Therapeutic Activity: 8-22 mins  Jakelyn Squyres , MS, OTR/L, CLT Pager: [phone]  12/03/2013, 4:25 PM

## 2013-12-03 NOTE — Discharge Summary (Signed)
Physician Discharge Summary  Eric Cameron UEA:[id number] DOB: August 14, 1934 DOA: 11/30/2013  PCP: No primary provider on file.  Admit date: 11/30/2013 Discharge date: 12/03/2013  Time spent: 45 minutes  Recommendations for Outpatient Follow-up:  Patient will be discharged to home. He will have home health physical therapy as well as occupational therapy. Patient will need to follow up with his primary care physician within one week of discharge. Patient should also follow Dr. Roda Shutters within 2 months of discharge.  Patient to continue taking his medications as prescribed. Patient should follow a dysphagia 3 heart healthy and carb modified diet.  Discharge Diagnoses:  Acute CVA Acute encephalopathy Essential hypertension Diabetes mellitus type 2 Dementia Hyperlipidemia  Discharge Condition: Stable  Diet recommendation: Dysphagia 3Heart healthy/modified  Filed Weights   11/30/13 2200  Weight: 61.871 kg (136 lb 6.4 oz)    History of present illness:  on 11/30/2013  Eric Cameron is a 79 y.o. male with history of dementia, diabetes mellitus (off medications for last 6 months), hypertension and hyperlipidemia was brought to the ER after patient's sister found the patient suddenly slumped to the left side while sitting on the chair at around 2:30 PM on the day of admission. Patient also became rigid and unresponsive for less than 10 seconds. Following that, the patient was looking more confused. Patient was brought to the ER and had a similar episode in the ER which lasted for few seconds. After the incident, patient per the family looked more confused than usual and was also tachycardic during the episode with rates going up to 140 per minute. CT head did not show anything acute. Patient was admitted for further observation and management. As per patient's sister with whom patient lives, patient did not have any nausea, vomiting, abdominal pain, fever, chills, diarrhea, chest pain, or shortness of  breath. No new medications were added. On initial exam, patient was following commands but refuses to open his mouth and eyes.   Hospital Course:  Acute CVA  -MRI of the brain:Small, acute pontine infarcts.  -MRA of the head: Normal intracranial MR angiography of the large and medium size vessels.  -Neurology consulted appreciated  -Echocardiogram: EF 65-70%, grade 1 diastolic dysfunction -Carotid Doppler: 1-39% internal carotid artery stenosis bilaterally. Vertebral arteries are patent with antegrade flow. -EEG: Focal slowing over the bilateral temporal regions, left greater than right, occasional sharp waves in the left frontotemporal region, focal tendency procedures to arise. -LDL 75, hemoglobin A1c 6.7  -Continue aspirin  -PT and OT consulted and recommended home health -Speech therapy has been consulted   Possible seizure -EEG findings below -Patient started on Vimpat by neurology -Neurology in one to 2 months.  Acute encephalopathy  -Likely secondary to the above  -Patient currently afebrile with no leukocytosis  -Chest x-ray:Bibasilar increased density. This could represent dependent atelectasis superimposed upon chronic interstitial thickening. However, early infection or aspiration cannot be excluded  -Abdominal x-ray: No acute finding  -Will likely repeat chest x-ray  -UA negative for infection   Essential hypertension  -Blood-pressure medications currently on hold (Lotrel)  -Allow permissive hypertension   Diabetes mellitus type 2  -Patient has not been on any medications for the past few months.  -Hemoglobin A1c 6.7  -Continue him from plain films CBG monitoring   Dementia  -Namenda currently held   Hyperlipidemia  -Lipid profile: Cholesterol 142, triglycerides 56, HDL 56, LDL 75  -Continue statin once patient is able to swallow  Procedures: Carotid Doppler: 1-39% internal carotid artery stenosis bilaterally.  Vertebral arteries are patent with antegrade  flow.  Echocardiogram Study Conclusions - Left ventricle: The cavity size was normal. Wall thickness was normal. Systolic function was vigorous. The estimated ejection fraction was in the range of 65% to 70%. Wall motion was normal; there were no regional wall motion abnormalities. Doppler parameters are consistent with abnormal left ventricular relaxation (grade 1 diastolic dysfunction). The E/e&' ratio is between 8-15, suggesting indeterminate LV filling pressure. - Aortic valve: There was trivial regurgitation. - Mitral valve: Mildly thickened leaflets . There was trivial regurgitation. - Left atrium: The atrium was normal in size. Impressions: LVEF 65-70%, normal wall thickness, normal chamber sizes, diastolic dysfunction with indeterminate LV Filling pressure.   EEG Impression:  This awake and asleep EEG is abnormal due to the presence of:  1. Independent focal slowing over the bilateral temporal regions, left greater than right  2. Occasional sharp waves over the left frontotemporal region  Clinical Correlation of the above findings indicates focal cerebral dysfunction over the bilateral temporal region, left greater than right, suggestive of underlying structural or physiologic abnormality. There is a focal tendency for seizures to arise from the left temporal region. There were no electrographic seizures seen in this study.  Consultations: Neurology  Discharge Exam: Filed Vitals:   12/03/13 0945  BP: 116/61  Pulse: 84  Temp: 97.6 F (36.4 C)  Resp: 20   Exam  General: Well developed, well nourished, NAD, appears stated age  HEENT: NCAT,mucous membranes moist.  Cardiovascular: S1 S2 auscultated, no rubs, murmurs or gallops. Regular rate and rhythm.  Respiratory: Clear to auscultation bilaterally with equal chest rise  Abdomen: Soft, nontender, nondistended, + bowel sounds  Extremities: warm dry without cyanosis clubbing or edema  Neuro: Awake and alert, only oriented to  self.  No focal deficits  Discharge Instructions      Discharge Instructions   Discharge instructions    Complete by:  As directed   Patient will be discharged to home. He will have home health physical therapy as well as occupational therapy. Patient will need to follow up with his primary care physician within one week of discharge. Patient should also follow Dr. Roda Shutters within 2 months of discharge.  Patient to continue taking his medications as prescribed. Patient should follow a dysphagia 3 heart healthy and carb modified diet.            Medication List         amLODipine-benazepril 5-10 MG per capsule  Commonly known as:  LOTREL  Take 1 capsule by mouth daily.     aspirin 325 MG tablet  Take 1 tablet (325 mg total) by mouth daily.     atorvastatin 20 MG tablet  Commonly known as:  LIPITOR  Take 20 mg by mouth daily at 6 PM.     citalopram 10 MG tablet  Commonly known as:  CELEXA  Take 10 mg by mouth daily.     lacosamide 50 MG Tabs tablet  Commonly known as:  VIMPAT  Take 1 tablet (50 mg total) by mouth 2 (two) times daily.     NAMENDA 10 MG tablet  Generic drug:  memantine  Take 10 mg by mouth 2 (two) times daily.       No Known Allergies Follow-up Information   Follow up with Xu,Jindong, MD. Schedule an appointment as soon as possible for a visit in 2 months. [facility] followup, Stroke clinic)    Specialty:  Neurology   Contact information:   [phone]  [address] [place] [address] [phone]       Follow up with Primary care physician . Schedule an appointment as soon as possible for a visit in 1 week. [facility] followup)        The results of significant diagnostics from this hospitalization (including imaging, microbiology, ancillary and laboratory) are listed below for reference.    Significant Diagnostic Studies: Dg Eye Foreign Body  12/01/2013   CLINICAL DATA:  Metal working/exposure; clearance prior to MRI  EXAM: ORBITS FOR FOREIGN  BODY - 2 VIEW  COMPARISON:  None.  FINDINGS: There is no evidence of metallic foreign body within the orbits. No significant bone abnormality identified.  IMPRESSION: No evidence of metallic foreign body within the orbits.   Electronically Signed   By: Sebastian Ache   On: 12/01/2013 12:48   Dg Chest 2 View  11/30/2013   CLINICAL DATA:  Altered mental status. Demented. Diabetic. Hypertension.  EXAM: CHEST  2 VIEW  COMPARISON:  None.  FINDINGS: Lateral view degraded by patient arm position. Lateral view is also oblique. Artifact degradation posteriorly.  Midline trachea. Borderline cardiomegaly, with a tortuous thoracic aorta. No pleural effusion or pneumothorax. Increased density at the lung bases, with suggestion of increased retrocardiac density on the lateral view. Upper lungs clear.  IMPRESSION: Bibasilar increased density. This could represent dependent atelectasis superimposed upon chronic interstitial thickening. However, early infection or aspiration cannot be excluded. Consider short term radiographic followup.  Multifactorial degradation involving the lateral view.   Electronically Signed   By: Jeronimo Greaves M.D.   On: 11/30/2013 16:37   Dg Abd 1 View  12/01/2013   CLINICAL DATA:  Pre MRI clearing films.  EXAM: ABDOMEN - 1 VIEW  COMPARISON:  None.  FINDINGS: No radiopaque foreign bodies. Specifically, there is no evidence of a metallic foreign body. Normal bowel gas pattern. Soft tissues are unremarkable. There are mild degenerative changes along the visualized spine. Bones are demineralized.  IMPRESSION: No acute findings.  No metallic foreign bodies.   Electronically Signed   By: Amie Portland M.D.   On: 12/01/2013 12:46   Ct Head Wo Contrast  11/30/2013   CLINICAL DATA:  Altered mental status.  Diabetic.  Demented.  EXAM: CT HEAD WITHOUT CONTRAST  TECHNIQUE: Contiguous axial images were obtained from the base of the skull through the vertex without intravenous contrast.  COMPARISON:  03/03/2007   FINDINGS: Sinuses/Soft tissues: Clear paranasal sinuses and mastoid air cells.  Intracranial: Moderate low density in the periventricular white matter likely related to small vessel disease. No mass lesion, hemorrhage, hydrocephalus, acute infarct, intra-axial, or extra-axial fluid collection.  IMPRESSION: No acute intracranial abnormality.   Electronically Signed   By: Jeronimo Greaves M.D.   On: 11/30/2013 16:40   Mr Maxine Glenn Head Wo Contrast  12/02/2013   CLINICAL DATA:  Acute encephalopathy. Altered mental status. Dementia and diabetes.  EXAM: MRA HEAD WITHOUT CONTRAST  TECHNIQUE: Angiographic images of the Circle of Willis were obtained using MRA technique without intravenous contrast.  COMPARISON:  MRI brain 12/01/2013  FINDINGS: Both internal carotid arteries are widely patent through the skullbase. No siphon stenosis. The anterior and middle cerebral vessels are patent without proximal stenosis, aneurysm or vascular malformation.  Both vertebral arteries are widely patent to the basilar. No basilar stenosis. Posterior circulation branch vessels appear normal.  IMPRESSION:  Normal intracranial MR angiography of the large and medium size vessels.  I think it is debatable if the pontine changes on yesterday's  study represent recent infarction or T2 shine through.   Electronically Signed   By: Paulina FusiMark  Shogry M.D.   On: 12/02/2013 10:09   Mri Brain Without Contrast  12/01/2013   CLINICAL DATA:  Acute encephalopathy. History dementia and diabetes.  EXAM: MRI HEAD WITHOUT CONTRAST  TECHNIQUE: Multiplanar, multiecho pulse sequences of the brain and surrounding structures were obtained without intravenous contrast.  COMPARISON:  Head CT 11/30/2013  FINDINGS: There are small, acute infarcts involving the left greater than right pons. Punctate foci of restricted diffusion versus artifact are questioned along the right and left lateral aspects of the splenium of the corpus callosum. No acute large territory supratentorial  infarct is seen. There is no intracranial hemorrhage, mass, midline shift, or extra-axial fluid collection. There is mild-to-moderate generalized cerebral atrophy. T2 hyperintensities in the cerebral white matter and pons are nonspecific but compatible with mild chronic small vessel ischemic disease.  Orbits are unremarkable. Paranasal sinuses are clear. There may be trace bilateral mastoid effusions. Major intracranial vascular flow voids are preserved.  IMPRESSION: 1. Small, acute pontine infarcts. 2. Artifact versus punctate infarcts along the lateral margins of the splenium of the corpus callosum.   Electronically Signed   By: Sebastian AcheAllen  Grady   On: 12/01/2013 13:34   Dg Swallowing Func-speech Pathology  12/02/2013   Riley NearingBonnie Caroline Deblois, CCC-SLP     12/02/2013  3:31 PM Objective Swallowing Evaluation: Modified Barium Swallowing Study   Patient Details  Name: Cornelia CopaRobert Duling MRN: [id number] Date of Birth: 09/04/1934  Today's Date: 12/02/2013 Time: [id number] SLP Time Calculation (min): 19 min  Past Medical History:  Past Medical History  Diagnosis Date  . Diabetes mellitus without complication   . Hypertension   . Alzheimer disease    Past Surgical History: History reviewed. No pertinent past  surgical history. HPI:  Patient is a 79 y.o. male with a past medical history significant  for HTN, DM, hyperlipidemia, and advanced dementia Alzheimer type  who [facility] after being found slumped to the  left side with more confusion. [facility]. CT without acute findings, however, MRI  showed small, acute pontine infarcts involving the left greater  than the right pons.       Assessment / Plan / Recommendation Clinical Impression  Dysphagia Diagnosis: Mild oral phase dysphagia;Mild pharyngeal  phase dysphagia Clinical impression: Pt demonstrates a mild oral phase dysphagia  characterized with lingual rocking movement and piecemeal transit  of boluses consistent with  baseline cognitive impairment.  Oropharyngeal phase was adequate with good airway protection, no  frank penetration or aspiration observed. Pt did have an  intermittent delay in swallow initiation (1 out of 5) trials,  with no negative impact to function. Pt is recommended to consume  a dys 3 (mechanical soft) diet (given missing dentition) with  thin liquids and basic aspiration precautions. No SLP f/u needed,  will sign off.     Treatment Recommendation  No treatment recommended at this time    Diet Recommendation Dysphagia 3 (Mechanical Soft);Thin liquid   Liquid Administration via: Cup;Straw Medication Administration: Whole meds with liquid Supervision: Patient able to self feed Compensations: Slow rate;Small sips/bites Postural Changes and/or Swallow Maneuvers: Seated upright 90  degrees    Other  Recommendations Oral Care Recommendations: Oral care BID   Follow Up Recommendations  24 hour supervision/assistance    Frequency and Duration        Pertinent Vitals/Pain None  SLP Swallow Goals     General Date of Onset: 11/30/13 HPI: Patient is a 79 y.o. male with a past medical history  significant for HTN, DM, hyperlipidemia, and advanced dementia  Alzheimer type who was admitted to [facility] on 8/15 after being found  slumped to the left side with more confusion. Pt was transferred  to [facility] for further evaluation. CT without acute findings, however,  MRI showed small, acute pontine infarcts involving the left  greater than the right pons.   Type of Study: Modified Barium Swallowing Study Reason for Referral: Objectively evaluate swallowing function Previous Swallow Assessment: N/A Diet Prior to this Study: NPO Temperature Spikes Noted: No Respiratory Status: Room air History of Recent Intubation: No Behavior/Cognition: Requires cueing;Confused;Pleasant  mood;Cooperative Oral Cavity - Dentition: Poor condition;Missing dentition Oral Motor / Sensory Function: Within functional limits Self-Feeding Abilities: Able to feed  self Patient Positioning: Upright in chair Baseline Vocal Quality: Clear Volitional Cough: Strong Volitional Swallow: Able to elicit Anatomy: Within functional limits Pharyngeal Secretions: Normal    Reason for Referral Objectively evaluate swallowing function   Oral Phase Oral Preparation/Oral Phase Oral Phase: Impaired Oral - Thin Oral - Thin Cup: Lingual pumping;Piecemeal swallowing Oral - Thin Straw: Lingual pumping;Piecemeal swallowing Oral - Solids Oral - Puree: Lingual pumping;Piecemeal swallowing Oral - Mechanical Soft: Lingual pumping;Piecemeal swallowing Oral - Pill: Lingual pumping;Piecemeal swallowing   Pharyngeal Phase Pharyngeal Phase Pharyngeal Phase: Impaired Pharyngeal - Thin Pharyngeal - Thin Cup: Within functional limits;Delayed swallow  initiation;Penetration/Aspiration during swallow;Pharyngeal  residue - pyriform sinuses Penetration/Aspiration details (thin cup): Material enters  airway, remains ABOVE vocal cords then ejected out Pharyngeal - Thin Straw: Within functional limits;Delayed swallow  initiation;Penetration/Aspiration during swallow;Pharyngeal  residue - pyriform sinuses Penetration/Aspiration details (thin straw): Material enters  airway, remains ABOVE vocal cords then ejected out Pharyngeal - Solids Pharyngeal - Puree: Premature spillage to valleculae Pharyngeal - Mechanical Soft: Premature spillage to valleculae Pharyngeal - Pill: Within functional limits;Delayed swallow  initiation;Penetration/Aspiration during swallow;Pharyngeal  residue - pyriform sinuses Penetration/Aspiration details (pill): Material enters airway,  remains ABOVE vocal cords then ejected out  Cervical Esophageal Phase    GO    Cervical Esophageal Phase Cervical Esophageal Phase: [facility] Cervical Esophageal Phase - Comment Cervical Esophageal Comment: Pt observed to have appearance of  bony protrusions on the anterior surface of the cervical spine  that did not signficantly impact bolus transit through the   cervical esophagus. (no radiologist present to confirm)        Harlon Ditty, MA CCC-SLP [phone]  Dyanne Iha Riley Nearing 12/02/2013, 3:28 PM     Microbiology: No results found for this or any previous visit (from the past 240 hour(s)).   Labs: Basic Metabolic Panel:  Recent Labs Lab 11/30/13 1603 11/30/13 2251  NA 135* 137  K 4.9 4.1  CL 99 101  CO2 23 23  GLUCOSE 261* 160*  BUN 20 14  CREATININE 0.37* 0.32*  CALCIUM 9.4 9.2   Liver Function Tests:  Recent Labs Lab 11/30/13 1603 11/30/13 2251  AST 37 25  ALT 38 35  ALKPHOS 70 68  BILITOT 0.3 0.4  PROT 7.7 7.6  ALBUMIN 3.4* 3.3*   No results found for this basename: LIPASE, AMYLASE,  in the last 168 hours No results found for this basename: AMMONIA,  in the last 168 hours CBC:  Recent Labs Lab 11/30/13 1603 11/30/13 2251  WBC 6.5 8.8  NEUTROABS 4.9 6.2  HGB 13.0 12.4*  HCT 40.2 38.5*  MCV 92.8 91.0  PLT 214 206   Cardiac Enzymes:  Recent Labs Lab 11/30/13 2135 12/01/13 0516 12/01/13 1104  TROPONINI <0.30 <0.30 <0.30   BNP: BNP (last 3 results) No results found for this basename: PROBNP,  in the last 8760 hours CBG:  Recent Labs Lab 12/02/13 0417 12/02/13 1643 12/02/13 2142 12/03/13 0640 12/03/13 0820  GLUCAP 111* 165* 224* 147* 212*       Signed:  Nicolina Hirt  [facility] 12/03/2013, 9:56 AM

## 2013-12-03 NOTE — Progress Notes (Signed)
Physical Therapy Treatment Patient Details Name: Eric Cameron MRN: [id number] DOB: 04-21-1934 Today's Date: 12/03/2013    History of Present Illness Eric Cameron is a 79 y.o. male with history of dementia, diabetes mellitus (off medications for last 6 months), hypertension and hyperlipidemia was brought to the ER after patient's sister found the patient suddenly slumped to the left side while sitting on the chair at around 2:30 PM. Patient also became rigid and unresponsive for less than 10 seconds. Following which patient was looking more confused. Patient was brought to the ER and had a similar episode in the ER which lasted for few seconds. After the incident patient per the family looks more confused than usual and was also tachycardic during the episode with rates going up to 140 per minute. Per MRI - Small, acute pontine infarcts.    PT Comments    Needs guard to assistance with gait.  The RW may cause falls at this time as it's had for him to focus on using the RW.  Follow Up Recommendations  Home health PT;Supervision/Assistance - 24 hour     Equipment Recommendations  Rolling walker with 5" wheels    Recommendations for Other Services       Precautions / Restrictions Precautions Precautions: Fall Restrictions Weight Bearing Restrictions: No    Mobility  Bed Mobility               General bed mobility comments: in chair  Transfers Overall transfer level: Needs assistance Equipment used: None;Rolling walker (2 wheeled) Transfers: Sit to/from Stand Sit to Stand: Min guard         General transfer comment: Patient with difficulty maintain dynamic standing during functional transfers and functional ambulation  Ambulation/Gait Ambulation/Gait assistance: Min guard;Min assist Ambulation Distance (Feet): 400 Feet Assistive device: Rolling walker (2 wheeled);None Gait Pattern/deviations: Step-through pattern;Scissoring;Ataxic;Staggering left;Staggering right  (VARIABLE BOS)     General Gait Details: distractions continue to contribute to pt's instability.  At this point, the walker adds another element of complexity for him and he kicks it and cannot stay within it to get the benefit of the stability.  He will need to practice with the HHPT.   Stairs            Wheelchair Mobility    Modified Rankin (Stroke Patients Only) Modified Rankin (Stroke Patients Only) Pre-Morbid Rankin Score: Moderate disability Modified Rankin: Moderately severe disability     Balance Overall balance assessment: Needs assistance Sitting-balance support: No upper extremity supported Sitting balance-Leahy Scale: Fair     Standing balance support: No upper extremity supported Standing balance-Leahy Scale: Fair Standing balance comment: more support for dynamic activities.  He lists off to the Left frequently with loss of focus.                    Cognition Arousal/Alertness: Awake/alert Behavior During Therapy: WFL for tasks assessed/performed Overall Cognitive Status: History of cognitive impairments - at baseline Area of Impairment: Following commands;Safety/judgement;Awareness;Problem solving       Following Commands: Follows one step commands with increased time Safety/Judgement: Decreased awareness of safety;Decreased awareness of deficits   Problem Solving: Slow processing;Difficulty sequencing;Requires verbal cues;Requires tactile cues      Exercises      General Comments        Pertinent Vitals/Pain Pain Assessment: No/denies pain    Home Living                      Prior Function  PT Goals (current goals can now be found in the care plan section) Acute Rehab PT Goals Patient Stated Goal: pt did not state but sister states to be at home PT Goal Formulation: With patient Time For Goal Achievement: 12/09/13 Potential to Achieve Goals: Good Progress towards PT goals: Progressing toward goals     Frequency  Min 4X/week    PT Plan Current plan remains appropriate    Co-evaluation             End of Session   Activity Tolerance: Patient tolerated treatment well Patient left: in chair;with call bell/phone within reach;with chair alarm set     Time: [id number] PT Time Calculation (min): 23 min  Charges:  $Gait Training: 8-22 mins $Therapeutic Activity: 8-22 mins                    G Codes:      Fantashia Shupert, Eliseo GumKenneth V 12/03/2013, 11:16 AM 12/03/2013  [place], PT [phone]  (pager)

## 2015-02-04 ENCOUNTER — Ambulatory Visit: Payer: Medicare Other | Admitting: Podiatry

## 2015-02-19 ENCOUNTER — Ambulatory Visit: Payer: Medicare Other | Admitting: Neurology

## 2015-02-19 ENCOUNTER — Telehealth: Payer: Self-pay | Admitting: *Deleted

## 2015-02-19 ENCOUNTER — Encounter: Payer: Self-pay | Admitting: Neurology

## 2015-02-19 NOTE — Telephone Encounter (Signed)
No showed new patient appointment. 

## 2015-03-03 ENCOUNTER — Encounter: Payer: Self-pay | Admitting: Podiatry

## 2015-03-03 ENCOUNTER — Ambulatory Visit (INDEPENDENT_AMBULATORY_CARE_PROVIDER_SITE_OTHER): Payer: Medicaid Other | Admitting: Podiatry

## 2015-03-03 VITALS — BP 138/68 | HR 67 | Resp 12

## 2015-03-03 DIAGNOSIS — M79674 Pain in right toe(s): Secondary | ICD-10-CM

## 2015-03-03 DIAGNOSIS — M79675 Pain in left toe(s): Secondary | ICD-10-CM

## 2015-03-03 DIAGNOSIS — M79676 Pain in unspecified toe(s): Secondary | ICD-10-CM | POA: Diagnosis not present

## 2015-03-03 DIAGNOSIS — B351 Tinea unguium: Secondary | ICD-10-CM

## 2015-03-03 NOTE — Patient Instructions (Signed)
Diabetes and Foot Care Diabetes may cause you to have problems because of poor blood supply (circulation) to your feet and legs. This may cause the skin on your feet to become thinner, break easier, and heal more slowly. Your skin may become dry, and the skin may peel and crack. You may also have nerve damage in your legs and feet causing decreased feeling in them. You may not notice minor injuries to your feet that could lead to infections or more serious problems. Taking care of your feet is one of the most important things you can do for yourself.  HOME CARE INSTRUCTIONS  Wear shoes at all times, even in the house. Do not go barefoot. Bare feet are easily injured.  Check your feet daily for blisters, cuts, and redness. If you cannot see the bottom of your feet, use a mirror or ask someone for help.  Wash your feet with warm water (do not use hot water) and mild soap. Then pat your feet and the areas between your toes until they are completely dry. Do not soak your feet as this can dry your skin.  Apply a moisturizing lotion or petroleum jelly (that does not contain alcohol and is unscented) to the skin on your feet and to dry, brittle toenails. Do not apply lotion between your toes.  Trim your toenails straight across. Do not dig under them or around the cuticle. File the edges of your nails with an emery board or nail file.  Do not cut corns or calluses or try to remove them with medicine.  Wear clean socks or stockings every day. Make sure they are not too tight. Do not wear knee-high stockings since they may decrease blood flow to your legs.  Wear shoes that fit properly and have enough cushioning. To break in new shoes, wear them for just a few hours a day. This prevents you from injuring your feet. Always look in your shoes before you put them on to be sure there are no objects inside.  Do not cross your legs. This may decrease the blood flow to your feet.  If you find a minor scrape,  cut, or break in the skin on your feet, keep it and the skin around it clean and dry. These areas may be cleansed with mild soap and water. Do not cleanse the area with peroxide, alcohol, or iodine.  When you remove an adhesive bandage, be sure not to damage the skin around it.  If you have a wound, look at it several times a day to make sure it is healing.  Do not use heating pads or hot water bottles. They may burn your skin. If you have lost feeling in your feet or legs, you may not know it is happening until it is too late.  Make sure your health care provider performs a complete foot exam at least annually or more often if you have foot problems. Report any cuts, sores, or bruises to your health care provider immediately. SEEK MEDICAL CARE IF:   You have an injury that is not healing.  You have cuts or breaks in the skin.  You have an ingrown nail.  You notice redness on your legs or feet.  You feel burning or tingling in your legs or feet.  You have pain or cramps in your legs and feet.  Your legs or feet are numb.  Your feet always feel cold. SEEK IMMEDIATE MEDICAL CARE IF:   There is increasing redness,   swelling, or pain in or around a wound.  There is a red line that goes up your leg.  Pus is coming from a wound.  You develop a fever or as directed by your health care provider.  You notice a bad smell coming from an ulcer or wound.   This information is not intended to replace advice given to you by your health care provider. Make sure you discuss any questions you have with your health care provider.   Document Released: 04/01/2000 Document Revised: 12/05/2012 Document Reviewed: 09/11/2012 Elsevier Interactive Patient Education 2016 [employer]  

## 2015-03-03 NOTE — Progress Notes (Signed)
   Subjective:    Patient ID: Eric CopaRobert Soja, male    DOB: 05-Sep-1934, 80 y.o.   MRN: [id number]  HPI   This patient presents today with his sister in the treatment. Patient sister is requesting debridement of extremely elongated thickened toenails that she says her brother complains about one he walks and wear shoes. The toes are becoming progressively more uncomfortable over time is a nails thickened and elongate. They've attempted to trim the toenails themselves, however, were unable to do so and are requesting nail debridement. The patient sister and patient deny any history of foot ulceration, claudication or amputation. The patient lives with his sister  Review of Systems  Constitutional: Positive for unexpected weight change.  Genitourinary: Positive for frequency.  Neurological: Positive for seizures and weakness.  Psychiatric/Behavioral: Positive for confusion.  All other systems reviewed and are negative.      Objective:   Physical Exam  Patient is confused and has difficulty responding to questioning. Patient sister is able to answer questions  Vascular: No peripheral edema bilaterally DP pulses 2/4 bilaterally PT pulses 0/4 bilaterally Capillary reflex immediate bilaterally  Neurological: Sensation to 10 g monofilament wire of 0/5 bilaterally bilaterally (patient confused and have difficulty responding) Vibratory sensation nonreactive bilaterally (patient is confused and has difficulty responding) Ankle reflex equal and reactive bilaterally  Dermatological: Dry skin bilaterally No open skin lesions bilaterally The toenails are extremely elongated, incurvated, brittle, discolored, hypertrophic and tender to direct palpation 6-10  Musculoskeletal: HAV deformity left Hammertoe deformities 2-4 bilaterally There is no restriction ankle, subtalar or midtarsal joints bilaterally      Assessment & Plan:   Assessment: Type II diabetic Decrease pedal pulses  suggestive of possible peripheral arterial disease Difficulty in evaluation. 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 I recommended debridement of the toenails and the patient and sisters verbally consents. The toenails 6-10 were debrided mechanically and electrically without any bleeding  Reappoint 3 months

## 2015-06-10 ENCOUNTER — Ambulatory Visit (INDEPENDENT_AMBULATORY_CARE_PROVIDER_SITE_OTHER): Payer: Medicaid Other | Admitting: Podiatry

## 2015-06-10 ENCOUNTER — Encounter: Payer: Self-pay | Admitting: Podiatry

## 2015-06-10 DIAGNOSIS — M79675 Pain in left toe(s): Secondary | ICD-10-CM

## 2015-06-10 DIAGNOSIS — M79676 Pain in unspecified toe(s): Secondary | ICD-10-CM

## 2015-06-10 DIAGNOSIS — B351 Tinea unguium: Secondary | ICD-10-CM

## 2015-06-10 DIAGNOSIS — M79674 Pain in right toe(s): Secondary | ICD-10-CM

## 2015-06-10 NOTE — Patient Instructions (Signed)
Diabetes and Foot Care Diabetes may cause you to have problems because of poor blood supply (circulation) to your feet and legs. This may cause the skin on your feet to become thinner, break easier, and heal more slowly. Your skin may become dry, and the skin may peel and crack. You may also have nerve damage in your legs and feet causing decreased feeling in them. You may not notice minor injuries to your feet that could lead to infections or more serious problems. Taking care of your feet is one of the most important things you can do for yourself.  HOME CARE INSTRUCTIONS  Wear shoes at all times, even in the house. Do not go barefoot. Bare feet are easily injured.  Check your feet daily for blisters, cuts, and redness. If you cannot see the bottom of your feet, use a mirror or ask someone for help.  Wash your feet with warm water (do not use hot water) and mild soap. Then pat your feet and the areas between your toes until they are completely dry. Do not soak your feet as this can dry your skin.  Apply a moisturizing lotion or petroleum jelly (that does not contain alcohol and is unscented) to the skin on your feet and to dry, brittle toenails. Do not apply lotion between your toes.  Trim your toenails straight across. Do not dig under them or around the cuticle. File the edges of your nails with an emery board or nail file.  Do not cut corns or calluses or try to remove them with medicine.  Wear clean socks or stockings every day. Make sure they are not too tight. Do not wear knee-high stockings since they may decrease blood flow to your legs.  Wear shoes that fit properly and have enough cushioning. To break in new shoes, wear them for just a few hours a day. This prevents you from injuring your feet. Always look in your shoes before you put them on to be sure there are no objects inside.  Do not cross your legs. This may decrease the blood flow to your feet.  If you find a minor scrape,  cut, or break in the skin on your feet, keep it and the skin around it clean and dry. These areas may be cleansed with mild soap and water. Do not cleanse the area with peroxide, alcohol, or iodine.  When you remove an adhesive bandage, be sure not to damage the skin around it.  If you have a wound, look at it several times a day to make sure it is healing.  Do not use heating pads or hot water bottles. They may burn your skin. If you have lost feeling in your feet or legs, you may not know it is happening until it is too late.  Make sure your health care provider performs a complete foot exam at least annually or more often if you have foot problems. Report any cuts, sores, or bruises to your health care provider immediately. SEEK MEDICAL CARE IF:   You have an injury that is not healing.  You have cuts or breaks in the skin.  You have an ingrown nail.  You notice redness on your legs or feet.  You feel burning or tingling in your legs or feet.  You have pain or cramps in your legs and feet.  Your legs or feet are numb.  Your feet always feel cold. SEEK IMMEDIATE MEDICAL CARE IF:   There is increasing redness,   swelling, or pain in or around a wound.  There is a red line that goes up your leg.  Pus is coming from a wound.  You develop a fever or as directed by your health care provider.  You notice a bad smell coming from an ulcer or wound.   This information is not intended to replace advice given to you by your health care provider. Make sure you discuss any questions you have with your health care provider.   Document Released: 04/01/2000 Document Revised: 12/05/2012 Document Reviewed: 09/11/2012 Elsevier Interactive Patient Education 2016 [employer]  

## 2015-06-10 NOTE — Progress Notes (Signed)
Patient ID: Eric Cameron, male   DOB: [birth year], 81 y.o.   MRN: [id number]   This patient presents today with his sister in the treatment. Patient sister is requesting debridement of extremely elongated thickened toenails that she says her brother complains about one he walks and wear shoes. The toes are becoming progressively more uncomfortable over time is a nails thickened and elongate. They've attempted to trim the toenails themselves, however, were unable to do so and are requesting nail debridement. The patient sister and patient deny any history of foot ulceration, claudication or amputation. The patient lives with his sister  This patient presents again for follow-up visit of 03/03/2015 again with a concern of thickened elongated toenails   Review of Systems  Constitutional: Positive for unexpected weight change.  Genitourinary: Positive for frequency.  Neurological: Positive for seizures and weakness.  Psychiatric/Behavioral: Positive for confusion.  All other systems reviewed and are negative.      Objective:   Physical Exam  Patient is confused and has difficulty responding to questioning. Patient sister is able to answer questions  Vascular: No peripheral edema bilaterally DP pulses 2/4 bilaterally PT pulses 0/4 bilaterally Capillary reflex immediate bilaterally  Neurological: Sensation to 10 g monofilament wire of 0/5 bilaterally bilaterally (patient confused and have difficulty responding) Vibratory sensation nonreactive bilaterally (patient is confused and has difficulty responding) Ankle reflex equal and reactive bilaterally  Dermatological: Dry skin bilaterally No open skin lesions bilaterally The toenails are extremely elongated, incurvated, brittle, discolored, hypertrophic and tender to direct palpation 6-10  Musculoskeletal: HAV deformity left Hammertoe deformities 2-4 bilaterally There is no restriction ankle, subtalar or midtarsal joints  bilaterally      Assessment & Plan:   Assessment: Type II diabetic Decrease pedal pulses suggestive of possible peripheral arterial disease Difficulty in evaluation. 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 I recommended debridement of the toenails and the patient and sisters verbally consents. The toenails 6-10 were debrided mechanically and electrically without any bleeding  Reappoint 3 months

## 2015-06-18 ENCOUNTER — Telehealth: Payer: Self-pay | Admitting: *Deleted

## 2015-06-18 ENCOUNTER — Ambulatory Visit: Payer: Medicaid Other | Admitting: Neurology

## 2015-06-18 NOTE — Telephone Encounter (Signed)
no showed new patinet apt

## 2015-06-19 ENCOUNTER — Encounter: Payer: Self-pay | Admitting: Neurology

## 2015-07-01 ENCOUNTER — Emergency Department (HOSPITAL_COMMUNITY): Payer: Medicare Other

## 2015-07-01 ENCOUNTER — Encounter (HOSPITAL_COMMUNITY): Payer: Self-pay

## 2015-07-01 ENCOUNTER — Inpatient Hospital Stay (HOSPITAL_COMMUNITY)
Admission: EM | Admit: 2015-07-01 | Discharge: 2015-07-10 | DRG: 640 | Disposition: A | Discharge status: Skilled Nursing Facility | Payer: Medicare Other | Attending: Family Medicine | Admitting: Family Medicine

## 2015-07-01 DIAGNOSIS — G40909 Epilepsy, unspecified, not intractable, without status epilepticus: Secondary | ICD-10-CM | POA: Diagnosis present

## 2015-07-01 DIAGNOSIS — Z87891 Personal history of nicotine dependence: Secondary | ICD-10-CM

## 2015-07-01 DIAGNOSIS — IMO0002 Reserved for concepts with insufficient information to code with codable children: Secondary | ICD-10-CM | POA: Diagnosis present

## 2015-07-01 DIAGNOSIS — Z66 Do not resuscitate: Secondary | ICD-10-CM | POA: Diagnosis present

## 2015-07-01 DIAGNOSIS — R569 Unspecified convulsions: Secondary | ICD-10-CM | POA: Diagnosis present

## 2015-07-01 DIAGNOSIS — R778 Other specified abnormalities of plasma proteins: Secondary | ICD-10-CM | POA: Diagnosis present

## 2015-07-01 DIAGNOSIS — E119 Type 2 diabetes mellitus without complications: Secondary | ICD-10-CM | POA: Diagnosis present

## 2015-07-01 DIAGNOSIS — E1149 Type 2 diabetes mellitus with other diabetic neurological complication: Secondary | ICD-10-CM | POA: Diagnosis not present

## 2015-07-01 DIAGNOSIS — E1165 Type 2 diabetes mellitus with hyperglycemia: Secondary | ICD-10-CM | POA: Diagnosis present

## 2015-07-01 DIAGNOSIS — R402112 Coma scale, eyes open, never, at arrival to emergency department: Secondary | ICD-10-CM | POA: Diagnosis present

## 2015-07-01 DIAGNOSIS — F0390 Unspecified dementia without behavioral disturbance: Secondary | ICD-10-CM | POA: Diagnosis present

## 2015-07-01 DIAGNOSIS — R4182 Altered mental status, unspecified: Secondary | ICD-10-CM | POA: Diagnosis not present

## 2015-07-01 DIAGNOSIS — Z7982 Long term (current) use of aspirin: Secondary | ICD-10-CM

## 2015-07-01 DIAGNOSIS — Z681 Body mass index (BMI) 19 or less, adult: Secondary | ICD-10-CM

## 2015-07-01 DIAGNOSIS — I10 Essential (primary) hypertension: Secondary | ICD-10-CM | POA: Diagnosis present

## 2015-07-01 DIAGNOSIS — E870 Hyperosmolality and hypernatremia: Secondary | ICD-10-CM | POA: Diagnosis not present

## 2015-07-01 DIAGNOSIS — J189 Pneumonia, unspecified organism: Secondary | ICD-10-CM | POA: Diagnosis not present

## 2015-07-01 DIAGNOSIS — B49 Unspecified mycosis: Secondary | ICD-10-CM | POA: Diagnosis present

## 2015-07-01 DIAGNOSIS — J9811 Atelectasis: Secondary | ICD-10-CM | POA: Diagnosis present

## 2015-07-01 DIAGNOSIS — E860 Dehydration: Principal | ICD-10-CM | POA: Diagnosis present

## 2015-07-01 DIAGNOSIS — Z833 Family history of diabetes mellitus: Secondary | ICD-10-CM

## 2015-07-01 DIAGNOSIS — R7989 Other specified abnormal findings of blood chemistry: Secondary | ICD-10-CM

## 2015-07-01 DIAGNOSIS — G309 Alzheimer's disease, unspecified: Secondary | ICD-10-CM | POA: Diagnosis present

## 2015-07-01 DIAGNOSIS — E876 Hypokalemia: Secondary | ICD-10-CM | POA: Diagnosis not present

## 2015-07-01 DIAGNOSIS — L89212 Pressure ulcer of right hip, stage 2: Secondary | ICD-10-CM | POA: Diagnosis present

## 2015-07-01 DIAGNOSIS — L89209 Pressure ulcer of unspecified hip, unspecified stage: Secondary | ICD-10-CM | POA: Insufficient documentation

## 2015-07-01 DIAGNOSIS — R509 Fever, unspecified: Secondary | ICD-10-CM

## 2015-07-01 DIAGNOSIS — G459 Transient cerebral ischemic attack, unspecified: Secondary | ICD-10-CM

## 2015-07-01 DIAGNOSIS — G9340 Encephalopathy, unspecified: Secondary | ICD-10-CM | POA: Diagnosis not present

## 2015-07-01 DIAGNOSIS — E039 Hypothyroidism, unspecified: Secondary | ICD-10-CM | POA: Diagnosis present

## 2015-07-01 DIAGNOSIS — R402352 Coma scale, best motor response, localizes pain, at arrival to emergency department: Secondary | ICD-10-CM | POA: Diagnosis present

## 2015-07-01 DIAGNOSIS — R402222 Coma scale, best verbal response, incomprehensible words, at arrival to emergency department: Secondary | ICD-10-CM | POA: Diagnosis present

## 2015-07-01 DIAGNOSIS — Z8673 Personal history of transient ischemic attack (TIA), and cerebral infarction without residual deficits: Secondary | ICD-10-CM

## 2015-07-01 DIAGNOSIS — F0280 Dementia in other diseases classified elsewhere without behavioral disturbance: Secondary | ICD-10-CM | POA: Diagnosis present

## 2015-07-01 DIAGNOSIS — E785 Hyperlipidemia, unspecified: Secondary | ICD-10-CM | POA: Diagnosis present

## 2015-07-01 DIAGNOSIS — R748 Abnormal levels of other serum enzymes: Secondary | ICD-10-CM | POA: Diagnosis present

## 2015-07-01 DIAGNOSIS — E43 Unspecified severe protein-calorie malnutrition: Secondary | ICD-10-CM | POA: Diagnosis present

## 2015-07-01 DIAGNOSIS — Z23 Encounter for immunization: Secondary | ICD-10-CM

## 2015-07-01 DIAGNOSIS — Z800 Family history of malignant neoplasm of digestive organs: Secondary | ICD-10-CM

## 2015-07-01 HISTORY — DX: Unspecified convulsions: R56.9

## 2015-07-01 LAB — I-STAT CG4 LACTIC ACID, ED: LACTIC ACID, VENOUS: 1.59 mmol/L (ref 0.5–2.0)

## 2015-07-01 LAB — URINALYSIS, ROUTINE W REFLEX MICROSCOPIC
BILIRUBIN URINE: NEGATIVE
Glucose, UA: >1000 mg/dL — AB
HGB URINE DIPSTICK: NEGATIVE
Ketones, ur: 15 mg/dL — AB
Leukocytes, UA: NEGATIVE
Nitrite: NEGATIVE
PH: 6 (ref 5.0–8.0)
Protein, ur: NEGATIVE mg/dL
SPECIFIC GRAVITY, URINE: 1.031 — AB (ref 1.005–1.030)

## 2015-07-01 LAB — I-STAT VENOUS BLOOD GAS, ED
ACID-BASE EXCESS: 2 mmol/L (ref 0.0–2.0)
Bicarbonate: 26 mEq/L — ABNORMAL HIGH (ref 20.0–24.0)
O2 SAT: 96 %
PO2 VEN: 80 mmHg — AB (ref 31.0–45.0)
TCO2: 27 mmol/L (ref 0–100)
pCO2, Ven: 36.4 mmHg — ABNORMAL LOW (ref 45.0–50.0)
pH, Ven: 7.463 — ABNORMAL HIGH (ref 7.250–7.300)

## 2015-07-01 LAB — CBC
HCT: 43.7 % (ref 39.0–52.0)
HEMOGLOBIN: 14 g/dL (ref 13.0–17.0)
MCH: 29.7 pg (ref 26.0–34.0)
MCHC: 32 g/dL (ref 30.0–36.0)
MCV: 92.6 fL (ref 78.0–100.0)
Platelets: 235 10*3/uL (ref 150–400)
RBC: 4.72 MIL/uL (ref 4.22–5.81)
RDW: 12.6 % (ref 11.5–15.5)
WBC: 11.4 10*3/uL — ABNORMAL HIGH (ref 4.0–10.5)

## 2015-07-01 LAB — COMPREHENSIVE METABOLIC PANEL
ALT: 21 U/L (ref 17–63)
ANION GAP: 18 — AB (ref 5–15)
AST: 28 U/L (ref 15–41)
Albumin: 3.9 g/dL (ref 3.5–5.0)
Alkaline Phosphatase: 61 U/L (ref 38–126)
BUN: 23 mg/dL — ABNORMAL HIGH (ref 6–20)
CO2: 22 mmol/L (ref 22–32)
Calcium: 9.8 mg/dL (ref 8.9–10.3)
Chloride: 100 mmol/L — ABNORMAL LOW (ref 101–111)
Creatinine, Ser: 0.63 mg/dL (ref 0.61–1.24)
GFR calc Af Amer: >60 mL/min (ref >60)
GFR calc non Af Amer: >60 mL/min (ref >60)
Glucose, Bld: 184 mg/dL — ABNORMAL HIGH (ref 65–99)
Potassium: 4.6 mmol/L (ref 3.5–5.1)
SODIUM: 140 mmol/L (ref 135–145)
Total Bilirubin: 0.8 mg/dL (ref 0.3–1.2)
Total Protein: 8.2 g/dL — ABNORMAL HIGH (ref 6.5–8.1)

## 2015-07-01 LAB — URINE MICROSCOPIC-ADD ON: BACTERIA UA: NONE SEEN

## 2015-07-01 LAB — TROPONIN I
TROPONIN I: 0.07 ng/mL — AB (ref <0.031)
Troponin I: <0.03 ng/mL (ref <0.031)

## 2015-07-01 MED ORDER — ASPIRIN 300 MG RE SUPP
300.0000 mg | Freq: Once | RECTAL | Status: AC
  Administered 2015-07-01: 300 mg via RECTAL
  Filled 2015-07-01: qty 1

## 2015-07-01 MED ORDER — ACETAMINOPHEN 650 MG RE SUPP: 650.0000 mg | Freq: Four times a day (QID) | RECTAL | Status: DC | PRN

## 2015-07-01 MED ORDER — SODIUM CHLORIDE 0.9% FLUSH
3.0000 mL | Freq: Two times a day (BID) | INTRAVENOUS | Status: DC
  Administered 2015-07-01 – 2015-07-07 (×6): 3 mL via INTRAVENOUS

## 2015-07-01 MED ORDER — ACETAMINOPHEN 650 MG RE SUPP: 650.0000 mg | RECTAL | Status: DC | PRN

## 2015-07-01 MED ORDER — ONDANSETRON HCL 4 MG/2ML IJ SOLN: 4.0000 mg | Freq: Four times a day (QID) | INTRAMUSCULAR | Status: DC | PRN

## 2015-07-01 MED ORDER — ONDANSETRON HCL 4 MG PO TABS: 4.0000 mg | ORAL_TABLET | Freq: Four times a day (QID) | ORAL | Status: DC | PRN

## 2015-07-01 MED ORDER — THIAMINE HCL 100 MG/ML IJ SOLN: 100.0000 mg | Freq: Every day | INTRAMUSCULAR | Status: DC

## 2015-07-01 MED ORDER — SODIUM CHLORIDE 0.9 % IV BOLUS (SEPSIS)
1000.0000 mL | Freq: Once | INTRAVENOUS | Status: AC
  Administered 2015-07-01: 1000 mL via INTRAVENOUS

## 2015-07-01 MED ORDER — ASPIRIN 325 MG PO TABS
325.0000 mg | ORAL_TABLET | Freq: Every day | ORAL | Status: DC
  Administered 2015-07-02 – 2015-07-10 (×9): 325 mg via ORAL
  Filled 2015-07-01 (×9): qty 1

## 2015-07-01 MED ORDER — INSULIN ASPART 100 UNIT/ML ~~LOC~~ SOLN
0.0000 [IU] | SUBCUTANEOUS | Status: DC
  Administered 2015-07-02: 3 [IU] via SUBCUTANEOUS
  Administered 2015-07-02: 2 [IU] via SUBCUTANEOUS
  Administered 2015-07-03: 1 [IU] via SUBCUTANEOUS
  Administered 2015-07-03 – 2015-07-04 (×2): 2 [IU] via SUBCUTANEOUS
  Administered 2015-07-04: 3 [IU] via SUBCUTANEOUS
  Administered 2015-07-04: 2 [IU] via SUBCUTANEOUS
  Administered 2015-07-05: 1 [IU] via SUBCUTANEOUS
  Administered 2015-07-05: 3 [IU] via SUBCUTANEOUS
  Administered 2015-07-06 (×2): 2 [IU] via SUBCUTANEOUS
  Administered 2015-07-06: 5 [IU] via SUBCUTANEOUS
  Administered 2015-07-06: 1 [IU] via SUBCUTANEOUS
  Administered 2015-07-06: 2 [IU] via SUBCUTANEOUS
  Administered 2015-07-06: 1 [IU] via SUBCUTANEOUS
  Administered 2015-07-07: 3 [IU] via SUBCUTANEOUS
  Administered 2015-07-07: 2 [IU] via SUBCUTANEOUS
  Administered 2015-07-07: 1 [IU] via SUBCUTANEOUS
  Administered 2015-07-07 (×2): 3 [IU] via SUBCUTANEOUS
  Administered 2015-07-08: 2 [IU] via SUBCUTANEOUS
  Administered 2015-07-08: 5 [IU] via SUBCUTANEOUS
  Administered 2015-07-08 (×2): 3 [IU] via SUBCUTANEOUS
  Administered 2015-07-08 – 2015-07-09 (×2): 1 [IU] via SUBCUTANEOUS
  Administered 2015-07-09: 2 [IU] via SUBCUTANEOUS
  Administered 2015-07-09: 3 [IU] via SUBCUTANEOUS
  Administered 2015-07-09: 1 [IU] via SUBCUTANEOUS
  Administered 2015-07-09 (×2): 5 [IU] via SUBCUTANEOUS
  Administered 2015-07-10: 3 [IU] via SUBCUTANEOUS
  Administered 2015-07-10 (×2): 1 [IU] via SUBCUTANEOUS
  Administered 2015-07-10: 3 [IU] via SUBCUTANEOUS

## 2015-07-01 MED ORDER — ASPIRIN 300 MG RE SUPP
300.0000 mg | Freq: Every day | RECTAL | Status: DC
  Filled 2015-07-01 (×14): qty 1

## 2015-07-01 MED ORDER — STROKE: EARLY STAGES OF RECOVERY BOOK
Freq: Once | Status: AC
  Administered 2015-07-01: 1
  Filled 2015-07-01: qty 1

## 2015-07-01 MED ORDER — ACETAMINOPHEN 325 MG PO TABS
650.0000 mg | ORAL_TABLET | Freq: Four times a day (QID) | ORAL | Status: DC | PRN
  Administered 2015-07-03 – 2015-07-04 (×2): 650 mg via ORAL
  Filled 2015-07-01 (×3): qty 2

## 2015-07-01 MED ORDER — ACETAMINOPHEN 325 MG PO TABS: 650.0000 mg | ORAL_TABLET | ORAL | Status: DC | PRN

## 2015-07-01 MED ORDER — SODIUM CHLORIDE 0.9 % IV SOLN
INTRAVENOUS | Status: DC
  Administered 2015-07-01: 23:00:00 via INTRAVENOUS

## 2015-07-01 NOTE — ED Notes (Signed)
Pt brought in EMS for AMS.  Per EMS, family reports that pt was walking and talking yesterday evening and was not able to do either this morning.  Pt maintains a fetal position on the right side upon arrival and smelled very strongly of urine.  Brief was saturated as was underlying blankets.  Brief changed and bed bath given to pt.  Pt has area of skin breakdown to right hip as well.

## 2015-07-01 NOTE — ED Provider Notes (Signed)
CSN: [id number]     Arrival date & time 07/01/15  1446 History   First MD Initiated Contact with Patient 07/01/15 1458     Chief Complaint  Patient presents with  . Altered Mental Status     (Consider location/radiation/quality/duration/timing/severity/associated sxs/prior Treatment) HPI Comments: 81 year old male with a history of diabetes, dementia, hypertension, CVA, prior EEG suggestive of seizure focus, presents with concern of altered mental status. Family is not present during my initial evaluation, however had reported that he was walking and talking yesterday and was not doing these this AM.  Patient with severe AMS on my exam, arms held in flexion however able to extend them with force, eyes closed, will make sounds in response to pain and localize to pain, however otherwise not responding to commands.   Attempted to call sister [name], however unable to get in contact with her.  Level V caveat, AMS   Past Medical History  Diagnosis Date  . Diabetes mellitus without complication (HCC)   . Hypertension   . Alzheimer disease    History reviewed. No pertinent past surgical history. Family History  Problem Relation Age of Onset  . Diabetes Mellitus II Brother   . Dementia Neg Hx   . Colon cancer Father    Social History  Substance Use Topics  . Smoking status: Former Games developermoker  . Smokeless tobacco: None  . Alcohol Use: No    Review of Systems  Unable to perform ROS: Dementia      Allergies  Review of patient's allergies indicates no known allergies.  Home Medications   Prior to Admission medications   Medication Sig Start Date End Date Taking? Authorizing Provider  amLODipine-benazepril (LOTREL) 5-10 MG per capsule Take 1 capsule by mouth daily.  11/10/13  Yes Historical Provider, MD  aspirin 325 MG tablet Take 1 tablet (325 mg total) by mouth daily. 12/03/13  Yes Maryann Mikhail, DO  atorvastatin (LIPITOR) 10 MG tablet Take 10 mg by mouth daily at 6 PM.   Yes  Historical Provider, MD  citalopram (CELEXA) 10 MG tablet Take 10 mg by mouth daily.  10/23/14  Yes Historical Provider, MD  INVOKAMET 50-500 MG TABS Take 1 tablet by mouth 2 (two) times daily. 02/24/15  Yes Historical Provider, MD  memantine (NAMENDA) 10 MG tablet Take 10 mg by mouth 2 (two) times daily.  10/23/14  Yes Historical Provider, MD  lacosamide (VIMPAT) 50 MG TABS tablet Take 1 tablet (50 mg total) by mouth 2 (two) times daily. 12/03/13   Maryann Mikhail, DO   BP 104/59 mmHg  Pulse 79  Temp(Src) 98.3 F (36.8 C) (Axillary)  Resp 16  Ht 5\' 9"  (1.753 m)  Wt 114 lb 3.2 oz (51.8 kg)  BMI 16.86 kg/m2  SpO2 97% Physical Exam  Constitutional: He is oriented to person, place, and time. He appears well-developed and well-nourished. No distress.  HENT:  Head: Normocephalic and atraumatic.  Mouth/Throat: Mucous membranes are dry. Dental caries present.  Eyes: Conjunctivae and EOM are normal.  Neck: Normal range of motion.  Cardiovascular: Normal rate, regular rhythm, normal heart sounds and intact distal pulses.  Exam reveals no gallop and no friction rub.   No murmur heard. Pulmonary/Chest: Effort normal and breath sounds normal. No respiratory distress. He has no wheezes. He has no rales.  Abdominal: Soft. He exhibits no distension. There is no tenderness. There is no guarding.  Musculoskeletal: He exhibits no edema.  Neurological: He is alert and oriented to person, place, and time.  GCS eye subscore is 1. GCS verbal subscore is 3. GCS motor subscore is 5.  On my exam, legs and arms crossed laying on back, hypertonic, able to straighten arms with passive movement, localizing to pain, able to spontaneously move all 4 ext, resist movements  Skin: Skin is warm and dry. He is not diaphoretic.  Nursing note and vitals reviewed.   ED Course  Procedures (including critical care time) Labs Review Labs Reviewed  COMPREHENSIVE METABOLIC PANEL - Abnormal; Notable for the following:    Chloride  100 (*)    Glucose, Bld 184 (*)    BUN 23 (*)    Total Protein 8.2 (*)    Anion gap 18 (*)    All other components within normal limits  CBC - Abnormal; Notable for the following:    WBC 11.4 (*)    All other components within normal limits  URINALYSIS, ROUTINE W REFLEX MICROSCOPIC (NOT AT [facility]) - Abnormal; Notable for the following:    Specific Gravity, Urine 1.031 (*)    Glucose, UA >1000 (*)    Ketones, ur 15 (*)    All other components within normal limits  TROPONIN I - Abnormal; Notable for the following:    Troponin I 0.07 (*)    All other components within normal limits  URINE MICROSCOPIC-ADD ON - Abnormal; Notable for the following:    Squamous Epithelial / LPF 0-5 (*)    Casts HYALINE CASTS (*)    All other components within normal limits  GLUCOSE, CAPILLARY - Abnormal; Notable for the following:    Glucose-Capillary 107 (*)    All other components within normal limits  I-STAT VENOUS BLOOD GAS, ED - Abnormal; Notable for the following:    pH, Ven 7.463 (*)    pCO2, Ven 36.4 (*)    pO2, Ven 80.0 (*)    Bicarbonate 26.0 (*)    All other components within normal limits  MRSA PCR SCREENING  TROPONIN I  HEMOGLOBIN A1C  MAGNESIUM  PHOSPHORUS  TSH  COMPREHENSIVE METABOLIC PANEL  CBC  TROPONIN I  LIPID PANEL  TROPONIN I  TROPONIN I  I-STAT CG4 LACTIC ACID, ED  I-STAT CG4 LACTIC ACID, ED    Imaging Review Ct Head Wo Contrast  07/01/2015  CLINICAL DATA:  Altered mental status.  Lethargic. EXAM: CT HEAD WITHOUT CONTRAST TECHNIQUE: Contiguous axial images were obtained from the base of the skull through the vertex without intravenous contrast. COMPARISON:  CT scan dated 11/30/2013 and MRI dated 12/01/2013 FINDINGS: No mass lesion. No midline shift. No acute hemorrhage or hematoma. No extra-axial fluid collections. No evidence of acute infarction. There is diffuse cerebral cortical atrophy with secondary slight ventricular dilatation. Extensive benign calcification along  the interhemispheric falx. Bones are normal. Previous pontine infarcts demonstrated on MRI are not appreciable on this exam. IMPRESSION: No acute intracranial abnormality.  Diffuse atrophy. Electronically Signed   By: Francene Boyers M.D.   On: 07/01/2015 16:36   Dg Chest Portable 1 View  07/01/2015  CLINICAL DATA:  Altered mental status, was walking and talking yesterday evening, unable to do either today EXAM: PORTABLE CHEST 1 VIEW COMPARISON:  Portable exam 1602 hours compared 11/30/2013 FINDINGS: Normal heart size and pulmonary vascularity. Calcification and tortuosity of thoracic aorta. Bibasilar atelectasis. Skin fold projects over RIGHT lung. No definite acute infiltrate, pleural effusion or pneumothorax. Bones demineralized. IMPRESSION: Bibasilar atelectasis. Electronically Signed   By: Ulyses Southward M.D.   On: 07/01/2015 16:09   I have personally  reviewed and evaluated these images and lab results as part of my medical decision-making.   EKG Interpretation None      MDM   Final diagnoses:  Elevated troponin  Dehydration  Altered mental status, unspecified altered mental status type   81 year old male with a history of diabetes, dementia, hypertension, CVA, prior EEG suggestive of seizure focus, presents with concern of altered mental status. Family is not present during my initial evaluation, however had reported that he was walking and talking yesterday and was not doing these this AM.  Patient with severe AMS on my exam, arms held in flexion however able to extend them with force, eyes closed, will make sounds in response to pain and localize to pain, however otherwise not responding to commands. I do feel he is protecting his airway.  His neck is supple, doubt meningitis. No generalized seizure on my evaluation. He is afebrile rectally. Urinalysis shows no signs of infection. Chest x-ray shows no signs of pneumonia. History is very limited by family not being at bedside at my time of  evaluation evaluation, and attempted to call patient's sister without success. Patient only with very mild leukocytosis, afebrile, do not have good source of infection, and at this time doubt infectious etiology of his symptoms. MRI ordered to evaluate for ischemia. Consulted hospitalist for admission and further workup. Patient's troponin returned elevated at 0.07. He is unable to provide history regarding chest pain, however we'll continue to trend as an inpatient. Patient was given aspirin suppository.  Patient to be admitted to hospitalist for further evaluation of altered mental status, MRI, EEG.       Alvira Monday, MD 07/02/15 [phone]

## 2015-07-01 NOTE — H&P (Signed)
PCP: Smothers, [name], NP [facility]   Referring provider [place]   Chief Complaint:  [place] Setting  HPI: Eric Cameron is a 81 y.o. male   has a past medical history of Diabetes mellitus without complication (HCC); Hypertension; and Alzheimer disease.   Was found in his bed unresponsive last time was seen normal last night. At his baseline he is able to walk on his own most of the time can dress himself with some minimal help his sister helps him bathe. He has history of dementia at baseline and usually confused. This morning he was responsive no fever no chest pain no other complaints. Patient has been in the fetal position ever since he was found. EMS was called he was brought then to emergency department  IN ER:  Remains in the fetal position not responding not acting answering any questions not following any commands very difficult to arouse. CT scan of the head no acute findings or succession bibasal atelectasis. Urine appear to be concentrated. WBC 11.4 bicarbonate 22 BUN elevated at 23 troponin elevated at 0.07 lactic acid 1.59. MRI has been ordered  Regarding pertinent past history: seizure disorder last seizure was 2015 as per family associated to her CVA.  Patient had hx of CVA in August  2015 involving left greater than right pons.  Last echogram was in 2016 showing EF 65-70 percent grade 1 diastolic dysfunction grade Doppler showing 39% ICA stenosis bilaterally. EEG was done in 2016 showing focal slowing over bilateral temporal regions left greater than right  Hospitalist was called for admission for acute encephalopathy  Review of Systems:   Unable to obtain due to confusion/ unresponciveness    Past Medical History: Past Medical History  Diagnosis Date  . Diabetes mellitus without complication (HCC)   . Hypertension   . Alzheimer disease    History reviewed. No pertinent past surgical history.   Medications: Prior to Admission medications     Medication Sig Start Date End Date Taking? Authorizing Provider  amLODipine-benazepril (LOTREL) 5-10 MG per capsule Take 1 capsule by mouth daily.  11/10/13  Yes Historical Provider, MD  aspirin 325 MG tablet Take 1 tablet (325 mg total) by mouth daily. 12/03/13  Yes Maryann Mikhail, DO  atorvastatin (LIPITOR) 10 MG tablet Take 10 mg by mouth daily at 6 PM.   Yes Historical Provider, MD  citalopram (CELEXA) 10 MG tablet Take 10 mg by mouth daily.  10/23/14  Yes Historical Provider, MD  INVOKAMET 50-500 MG TABS Take 1 tablet by mouth 2 (two) times daily. 02/24/15  Yes Historical Provider, MD  memantine (NAMENDA) 10 MG tablet Take 10 mg by mouth 2 (two) times daily.  10/23/14  Yes Historical Provider, MD  lacosamide (VIMPAT) 50 MG TABS tablet Take 1 tablet (50 mg total) by mouth 2 (two) times daily. 12/03/13   Maryann Mikhail, DO    Allergies:  No Known Allergies  Social History:  Ambulatory  independently   Lives at home      With family     reports that he has quit smoking. He does not have any smokeless tobacco history on file. He reports that he does not drink alcohol or use illicit drugs.     Family History: family history includes Colon cancer in his father; Diabetes Mellitus II in his brother. There is no history of Dementia.    Physical Exam: Patient Vitals for the past 24 hrs:  BP Temp Temp src Pulse Resp SpO2 Height Weight  07/01/15  1700 125/69 mmHg - - 79 10 99 % - -  07/01/15 1500 113/66 mmHg - - 82 - 98 % - -  07/01/15 1447 110/63 mmHg 99.9 F (37.7 C) Rectal 81 18 98 % 5\' 9"  (1.753 m) 52.164 kg (115 lb)    1. General:  in No Acute distress 2. Psychological: Somnolent not  Oriented 3. Head/ENT:    Dry Mucous Membranes                          Head Non traumatic, neck supple                            Poor Dentition 4. SKIN: decreased Skin turgor,  Skin clean Dry and intact no rash 5. Heart: Regular rate and rhythm no Murmur, Rub or gallop 6. Lungs:  Clear to  auscultation bilaterally, no wheezes or crackles   7. Abdomen: Soft, non-tender, Non distended 8. Lower extremities: no clubbing, cyanosis, or edema 9. Neurologically patient is in fetal position  10. MSK: Normal range of motion  body mass index is 16.97 kg/(m^2).   Labs on Admission:   Results for orders placed or performed during the hospital encounter of 07/01/15 (from the past 24 hour(s))  Urinalysis, Routine w reflex microscopic (not at [facility])     Status: Abnormal   Collection Time: 07/01/15  3:05 PM  Result Value Ref Range   Color, Urine YELLOW YELLOW   APPearance CLEAR CLEAR   Specific Gravity, Urine 1.031 (H) 1.005 - 1.030   pH 6.0 5.0 - 8.0   Glucose, UA >1000 (A) NEGATIVE mg/dL   Hgb urine dipstick NEGATIVE NEGATIVE   Bilirubin Urine NEGATIVE NEGATIVE   Ketones, ur 15 (A) NEGATIVE mg/dL   Protein, ur NEGATIVE NEGATIVE mg/dL   Nitrite NEGATIVE NEGATIVE   Leukocytes, UA NEGATIVE NEGATIVE  Urine microscopic-add on     Status: Abnormal   Collection Time: 07/01/15  3:05 PM  Result Value Ref Range   Squamous Epithelial / LPF 0-5 (A) NONE SEEN   WBC, UA 0-5 0 - 5 WBC/hpf   RBC / HPF 0-5 0 - 5 RBC/hpf   Bacteria, UA NONE SEEN NONE SEEN   Casts HYALINE CASTS (A) NEGATIVE   Urine-Other MUCOUS PRESENT   Comprehensive metabolic panel     Status: Abnormal   Collection Time: 07/01/15  3:31 PM  Result Value Ref Range   Sodium 140 135 - 145 mmol/L   Potassium 4.6 3.5 - 5.1 mmol/L   Chloride 100 (L) 101 - 111 mmol/L   CO2 22 22 - 32 mmol/L   Glucose, Bld 184 (H) 65 - 99 mg/dL   BUN 23 (H) 6 - 20 mg/dL   Creatinine, Ser 1.61 0.61 - 1.24 mg/dL   Calcium 9.8 8.9 - 09.6 mg/dL   Total Protein 8.2 (H) 6.5 - 8.1 g/dL   Albumin 3.9 3.5 - 5.0 g/dL   AST 28 15 - 41 U/L   ALT 21 17 - 63 U/L   Alkaline Phosphatase 61 38 - 126 U/L   Total Bilirubin 0.8 0.3 - 1.2 mg/dL   GFR calc non Af Amer >60 >60 mL/min   GFR calc Af Amer >60 >60 mL/min   Anion gap 18 (H) 5 - 15  CBC     Status:  Abnormal   Collection Time: 07/01/15  3:31 PM  Result Value Ref Range   WBC 11.4 (  H) 4.0 - 10.5 K/uL   RBC 4.72 4.22 - 5.81 MIL/uL   Hemoglobin 14.0 13.0 - 17.0 g/dL   HCT 16.1 09.6 - 04.5 %   MCV 92.6 78.0 - 100.0 fL   MCH 29.7 26.0 - 34.0 pg   MCHC 32.0 30.0 - 36.0 g/dL   RDW 40.9 81.1 - 91.4 %   Platelets 235 150 - 400 K/uL  Troponin I     Status: Abnormal   Collection Time: 07/01/15  4:03 PM  Result Value Ref Range   Troponin I 0.07 (H) <0.031 ng/mL    UA  no evidence of UTI  Blood glucose >1000  Lab Results  Component Value Date   HGBA1C 6.7* 11/30/2013    Estimated Creatinine Clearance: 56.2 mL/min (by C-G formula based on Cr of 0.63).  BNP (last 3 results) No results for input(s): PROBNP in the last 8760 hours.  Other results:  I have pearsonaly reviewed this: ECG REPORT  Rate: 90's  Rhythm: sinus rhythm ST&T Change: flattened t waves QTC wnl  Filed Weights   07/01/15 1447  Weight: 52.164 kg (115 lb)     Cultures:    Component Value Date/Time   SDES URINE, RANDOM 12/22/2008 1538   SPECREQUEST NONE 12/22/2008 1538   CULT  12/22/2008 1538    Multiple bacterial morphotypes present, none predominant. Suggest appropriate recollection if clinically indicated.   REPTSTATUS 12/24/2008 FINAL 12/22/2008 1538     Radiological Exams on Admission: Ct Head Wo Contrast  07/01/2015  CLINICAL DATA:  Altered mental status.  Lethargic. EXAM: CT HEAD WITHOUT CONTRAST TECHNIQUE: Contiguous axial images were obtained from the base of the skull through the vertex without intravenous contrast. COMPARISON:  CT scan dated 11/30/2013 and MRI dated 12/01/2013 FINDINGS: No mass lesion. No midline shift. No acute hemorrhage or hematoma. No extra-axial fluid collections. No evidence of acute infarction. There is diffuse cerebral cortical atrophy with secondary slight ventricular dilatation. Extensive benign calcification along the interhemispheric falx. Bones are normal. Previous  pontine infarcts demonstrated on MRI are not appreciable on this exam. IMPRESSION: No acute intracranial abnormality.  Diffuse atrophy. Electronically Signed   By: Francene Boyers M.D.   On: 07/01/2015 16:36   Dg Chest Portable 1 View  07/01/2015  CLINICAL DATA:  Altered mental status, was walking and talking yesterday evening, unable to do either today EXAM: PORTABLE CHEST 1 VIEW COMPARISON:  Portable exam 1602 hours compared 11/30/2013 FINDINGS: Normal heart size and pulmonary vascularity. Calcification and tortuosity of thoracic aorta. Bibasilar atelectasis. Skin fold projects over RIGHT lung. No definite acute infiltrate, pleural effusion or pneumothorax. Bones demineralized. IMPRESSION: Bibasilar atelectasis. Electronically Signed   By: Ulyses Southward M.D.   On: 07/01/2015 16:09    Chart has been reviewed  Family   at  Bedside  plan of care was discussed with   Christen Bame [name]  Assessment/Plan  81 year old gentleman history of diabetes dementia past history of CVA and os was seizure activity in 2016 presents of acute encephalopathy found poorly responsive appears to be mildly dehydrated  Present on Admission:   . Acute encephalopathy - we will obtain MRI to rule out CVA patient presented with similar episode in the past. Obtain EEG to rule out seizure activity. Neurology consult if MRI or EEG positive we'll rehydrate  . HTN (hypertension) hold home medications patient is nothing by mouth secondary to inability to tolerate due to unresponsiveness permissive hypertension for now  . Elevated troponin -she did not endorse any chest  pain we'll obtain serial cardiac markers and EKG obtain echogram in the morning  . Dehydration administer IV fluids . Diabetes mellitus type 2, uncontrolled (HCC) - sensitive sliding scale  . Dementia - chronic expect some degree of sundowning   Prophylaxis: SCD   CODE STATUS:    DNR/DNI as per family  Disposition:   likely will need placement for  rehabilitation                        Other plan as per orders.  I have spent a total of 56 min on this admission    Shiori Adcox 07/01/2015, 8:57 PM    [facility]  Pager [phone]   after 2 AM please page floor coverage PA If 7AM-7PM, please contact the day team taking care of the patient  [email or web address]  Password [email or web address]

## 2015-07-02 ENCOUNTER — Observation Stay (HOSPITAL_COMMUNITY): Payer: Medicare Other

## 2015-07-02 ENCOUNTER — Inpatient Hospital Stay (HOSPITAL_COMMUNITY): Payer: Medicare Other

## 2015-07-02 ENCOUNTER — Ambulatory Visit (HOSPITAL_COMMUNITY): Payer: Medicare Other

## 2015-07-02 DIAGNOSIS — R569 Unspecified convulsions: Secondary | ICD-10-CM | POA: Diagnosis not present

## 2015-07-02 DIAGNOSIS — E785 Hyperlipidemia, unspecified: Secondary | ICD-10-CM | POA: Diagnosis not present

## 2015-07-02 DIAGNOSIS — Z833 Family history of diabetes mellitus: Secondary | ICD-10-CM | POA: Diagnosis not present

## 2015-07-02 DIAGNOSIS — R4182 Altered mental status, unspecified: Secondary | ICD-10-CM | POA: Diagnosis present

## 2015-07-02 DIAGNOSIS — E039 Hypothyroidism, unspecified: Secondary | ICD-10-CM | POA: Diagnosis not present

## 2015-07-02 DIAGNOSIS — Z7982 Long term (current) use of aspirin: Secondary | ICD-10-CM | POA: Diagnosis not present

## 2015-07-02 DIAGNOSIS — E860 Dehydration: Secondary | ICD-10-CM | POA: Diagnosis present

## 2015-07-02 DIAGNOSIS — B3749 Other urogenital candidiasis: Secondary | ICD-10-CM | POA: Diagnosis not present

## 2015-07-02 DIAGNOSIS — E43 Unspecified severe protein-calorie malnutrition: Secondary | ICD-10-CM | POA: Diagnosis not present

## 2015-07-02 DIAGNOSIS — F0280 Dementia in other diseases classified elsewhere without behavioral disturbance: Secondary | ICD-10-CM | POA: Diagnosis present

## 2015-07-02 DIAGNOSIS — G459 Transient cerebral ischemic attack, unspecified: Secondary | ICD-10-CM

## 2015-07-02 DIAGNOSIS — G9340 Encephalopathy, unspecified: Secondary | ICD-10-CM | POA: Diagnosis not present

## 2015-07-02 DIAGNOSIS — B49 Unspecified mycosis: Secondary | ICD-10-CM | POA: Diagnosis not present

## 2015-07-02 DIAGNOSIS — Z23 Encounter for immunization: Secondary | ICD-10-CM | POA: Diagnosis not present

## 2015-07-02 DIAGNOSIS — E870 Hyperosmolality and hypernatremia: Secondary | ICD-10-CM | POA: Diagnosis not present

## 2015-07-02 DIAGNOSIS — I6789 Other cerebrovascular disease: Secondary | ICD-10-CM | POA: Diagnosis not present

## 2015-07-02 DIAGNOSIS — E119 Type 2 diabetes mellitus without complications: Secondary | ICD-10-CM | POA: Diagnosis not present

## 2015-07-02 DIAGNOSIS — I10 Essential (primary) hypertension: Secondary | ICD-10-CM | POA: Diagnosis not present

## 2015-07-02 DIAGNOSIS — L89212 Pressure ulcer of right hip, stage 2: Secondary | ICD-10-CM | POA: Diagnosis not present

## 2015-07-02 DIAGNOSIS — Z8673 Personal history of transient ischemic attack (TIA), and cerebral infarction without residual deficits: Secondary | ICD-10-CM | POA: Diagnosis not present

## 2015-07-02 DIAGNOSIS — Z66 Do not resuscitate: Secondary | ICD-10-CM | POA: Diagnosis not present

## 2015-07-02 DIAGNOSIS — G40909 Epilepsy, unspecified, not intractable, without status epilepticus: Secondary | ICD-10-CM | POA: Diagnosis not present

## 2015-07-02 DIAGNOSIS — J189 Pneumonia, unspecified organism: Secondary | ICD-10-CM | POA: Diagnosis not present

## 2015-07-02 DIAGNOSIS — R402112 Coma scale, eyes open, never, at arrival to emergency department: Secondary | ICD-10-CM | POA: Diagnosis present

## 2015-07-02 DIAGNOSIS — J9811 Atelectasis: Secondary | ICD-10-CM | POA: Diagnosis not present

## 2015-07-02 DIAGNOSIS — R402222 Coma scale, best verbal response, incomprehensible words, at arrival to emergency department: Secondary | ICD-10-CM | POA: Diagnosis present

## 2015-07-02 DIAGNOSIS — R748 Abnormal levels of other serum enzymes: Secondary | ICD-10-CM | POA: Diagnosis not present

## 2015-07-02 DIAGNOSIS — I4581 Long QT syndrome: Secondary | ICD-10-CM | POA: Diagnosis not present

## 2015-07-02 DIAGNOSIS — G309 Alzheimer's disease, unspecified: Secondary | ICD-10-CM | POA: Diagnosis present

## 2015-07-02 DIAGNOSIS — Z87891 Personal history of nicotine dependence: Secondary | ICD-10-CM | POA: Diagnosis not present

## 2015-07-02 DIAGNOSIS — Z681 Body mass index (BMI) 19 or less, adult: Secondary | ICD-10-CM | POA: Diagnosis not present

## 2015-07-02 DIAGNOSIS — R402352 Coma scale, best motor response, localizes pain, at arrival to emergency department: Secondary | ICD-10-CM | POA: Diagnosis not present

## 2015-07-02 DIAGNOSIS — E1165 Type 2 diabetes mellitus with hyperglycemia: Secondary | ICD-10-CM | POA: Diagnosis not present

## 2015-07-02 DIAGNOSIS — E876 Hypokalemia: Secondary | ICD-10-CM | POA: Diagnosis not present

## 2015-07-02 DIAGNOSIS — F0390 Unspecified dementia without behavioral disturbance: Secondary | ICD-10-CM | POA: Diagnosis not present

## 2015-07-02 DIAGNOSIS — R7989 Other specified abnormal findings of blood chemistry: Secondary | ICD-10-CM | POA: Diagnosis not present

## 2015-07-02 DIAGNOSIS — E1149 Type 2 diabetes mellitus with other diabetic neurological complication: Secondary | ICD-10-CM | POA: Diagnosis not present

## 2015-07-02 DIAGNOSIS — Z800 Family history of malignant neoplasm of digestive organs: Secondary | ICD-10-CM | POA: Diagnosis not present

## 2015-07-02 LAB — PHOSPHORUS: Phosphorus: 2.8 mg/dL (ref 2.5–4.6)

## 2015-07-02 LAB — COMPREHENSIVE METABOLIC PANEL
ALBUMIN: 3.1 g/dL — AB (ref 3.5–5.0)
ALK PHOS: 45 U/L (ref 38–126)
ALT: 16 U/L — ABNORMAL LOW (ref 17–63)
ANION GAP: 10 (ref 5–15)
AST: 31 U/L (ref 15–41)
BUN: 15 mg/dL (ref 6–20)
CALCIUM: 8.7 mg/dL — AB (ref 8.9–10.3)
CO2: 24 mmol/L (ref 22–32)
Chloride: 106 mmol/L (ref 101–111)
Creatinine, Ser: 0.53 mg/dL — ABNORMAL LOW (ref 0.61–1.24)
GFR calc Af Amer: >60 mL/min (ref >60)
GFR calc non Af Amer: >60 mL/min (ref >60)
GLUCOSE: 87 mg/dL (ref 65–99)
Potassium: 3.8 mmol/L (ref 3.5–5.1)
SODIUM: 140 mmol/L (ref 135–145)
Total Bilirubin: 0.8 mg/dL (ref 0.3–1.2)
Total Protein: 6.6 g/dL (ref 6.5–8.1)

## 2015-07-02 LAB — MAGNESIUM
Magnesium: 1.8 mg/dL (ref 1.7–2.4)
Magnesium: 1.9 mg/dL (ref 1.7–2.4)

## 2015-07-02 LAB — AMMONIA: AMMONIA: 28 umol/L (ref 9–35)

## 2015-07-02 LAB — LIPID PANEL
CHOL/HDL RATIO: 2.3 ratio
Cholesterol: 133 mg/dL (ref 0–200)
HDL: 58 mg/dL (ref >40)
LDL Cholesterol: 69 mg/dL (ref 0–99)
Triglycerides: 30 mg/dL (ref <150)
VLDL: 6 mg/dL (ref 0–40)

## 2015-07-02 LAB — GLUCOSE, CAPILLARY
GLUCOSE-CAPILLARY: 107 mg/dL — AB (ref 65–99)
GLUCOSE-CAPILLARY: 120 mg/dL — AB (ref 65–99)
GLUCOSE-CAPILLARY: 192 mg/dL — AB (ref 65–99)
GLUCOSE-CAPILLARY: 214 mg/dL — AB (ref 65–99)
Glucose-Capillary: 77 mg/dL (ref 65–99)
Glucose-Capillary: 74 mg/dL (ref 65–99)

## 2015-07-02 LAB — TROPONIN I: TROPONIN I: 0.03 ng/mL (ref <0.031)

## 2015-07-02 LAB — CBC
HEMATOCRIT: 36.1 % — AB (ref 39.0–52.0)
HEMOGLOBIN: 11.4 g/dL — AB (ref 13.0–17.0)
MCH: 29.2 pg (ref 26.0–34.0)
MCHC: 31.6 g/dL (ref 30.0–36.0)
MCV: 92.3 fL (ref 78.0–100.0)
Platelets: 212 10*3/uL (ref 150–400)
RBC: 3.91 MIL/uL — ABNORMAL LOW (ref 4.22–5.81)
RDW: 12.7 % (ref 11.5–15.5)
WBC: 8.7 10*3/uL (ref 4.0–10.5)

## 2015-07-02 LAB — TSH: TSH: 0.271 u[IU]/mL — AB (ref 0.350–4.500)

## 2015-07-02 LAB — T4, FREE: Free T4: 0.84 ng/dL (ref 0.61–1.12)

## 2015-07-02 LAB — MRSA PCR SCREENING: MRSA by PCR: NEGATIVE

## 2015-07-02 MED ORDER — LORAZEPAM 2 MG/ML IJ SOLN: 1.0000 mg | Freq: Four times a day (QID) | INTRAMUSCULAR | Status: DC | PRN

## 2015-07-02 MED ORDER — FOLIC ACID 5 MG/ML IJ SOLN
1.0000 mg | Freq: Every day | INTRAMUSCULAR | Status: DC
  Administered 2015-07-02: 1 mg via INTRAVENOUS
  Filled 2015-07-02: qty 0
  Filled 2015-07-02 (×2): qty 0.2

## 2015-07-02 MED ORDER — LACOSAMIDE 50 MG PO TABS
50.0000 mg | ORAL_TABLET | Freq: Two times a day (BID) | ORAL | Status: DC
  Administered 2015-07-02 – 2015-07-10 (×17): 50 mg via ORAL
  Filled 2015-07-02 (×17): qty 1

## 2015-07-02 MED ORDER — THIAMINE HCL 100 MG/ML IJ SOLN
100.0000 mg | Freq: Every day | INTRAMUSCULAR | Status: DC
  Administered 2015-07-02 – 2015-07-03 (×2): 100 mg via INTRAVENOUS
  Filled 2015-07-02 (×2): qty 2

## 2015-07-02 MED ORDER — SODIUM CHLORIDE 0.9 % IV SOLN
INTRAVENOUS | Status: DC
  Administered 2015-07-02 – 2015-07-03 (×3): via INTRAVENOUS

## 2015-07-02 NOTE — Evaluation (Addendum)
Occupational Therapy Evaluation Patient Details Name: Eric Cameron MRN: [id number] DOB: [birth year] Today's Date: 07/02/2015    History of Present Illness Eric Cameron is a 81 y.o. male has a past medical history of Diabetes mellitus without complication (HCC); Hypertension; and Alzheimer disease. Was found in his bed unresponsive last time was seen normal last night. At his baseline he is able to walk on his own most of the time can dress himself with some minimal help his sister helps him bathe. He has history of dementia at baseline and usually confused. This morning he was responsive no fever no chest pain no other complaints. Patient has been in the fetal position ever since he was found.    Clinical Impression   Patient presenting with decreased ADL and functional mobility independence secondary to above. Patient mod I to min assist PTA. Patient currently functioning at an overall min to mod assist level. Patient will benefit from acute OT to increase overall independence in the areas of ADLs, functional mobility, and overall safety in order to safely discharge home with 24/7 supervision/assistance from family (sister and brother). According to patient's sister, pt with poor cognition PTA, but sister reports "he's more confused now". Sister states pt with poor hygiene and that she assists him with bathing secondary to "he doesn't do it good enough".   Towards end of session, patient's 02 sats in low 70's (readings from therapists pulse ox and monitor in room), immediately notified RN of this.     Follow Up Recommendations  No OT follow up;Supervision/Assistance - 24 hour    Equipment Recommendations  None recommended by OT    Recommendations for Other Services  None at this time   Precautions / Restrictions Precautions Precautions: Fall Restrictions Weight Bearing Restrictions: No    Mobility Bed Mobility General bed mobility comments: Please see PT note, pt found seated in  recliner upon OT entering/exiting room   Transfers Overall transfer level: Needs assistance Equipment used: 1 person hand held assist Transfers: Sit to/from Stand Sit to Stand: Min assist   General transfer comment: Min A for balance.     Balance Overall balance assessment: Needs assistance Sitting-balance support: No upper extremity supported;Feet supported Sitting balance-Leahy Scale: Good     Standing balance support: No upper extremity supported;During functional activity Standing balance-Leahy Scale: Fair Standing balance comment: slight unsteadiness     ADL Overall ADL's : Needs assistance/impaired   General ADL Comments: Pt requires overall min to mod assist with ADLs and functional mobility. Pt with a decrease in cognition and requires increased cueing and assistance.     Vision Additional Comments: Unsure due to patient's decreased cognition       Hand Dominance Left   Extremity/Trunk Assessment Upper Extremity Assessment Upper Extremity Assessment: Generalized weakness   Lower Extremity Assessment Lower Extremity Assessment: Generalized weakness   Cervical / Trunk Assessment Cervical / Trunk Assessment: Kyphotic   Communication Communication Communication: Receptive difficulties;Expressive difficulties;HOH   Cognition Arousal/Alertness: Awake/alert Behavior During Therapy: Flat affect Overall Cognitive Status: Impaired/Different from baseline Area of Impairment: Orientation;Attention;Memory;Following commands;Safety/judgement;Awareness;Problem solving Orientation Level: Disoriented to;Place;Time;Situation Current Attention Level: Sustained Memory: Decreased short-term memory Following Commands: Follows one step commands inconsistently Safety/Judgement: Decreased awareness of safety;Decreased awareness of deficits Awareness: Intellectual Problem Solving: Slow processing;Difficulty sequencing;Requires verbal cues;Requires tactile cues General  Comments: Pt putting toothpaste on wrong side of toothbrush              Home Living Family/patient expects to be discharged to:: Private residence Living  Arrangements: Other relatives (brother and sister) Available Help at Discharge: Family;Available 24 hours/day Type of Home: House Home Access: Stairs to enter Entergy CorporationEntrance Stairs-Number of Steps: 1 small step Entrance Stairs-Rails: None Home Layout: One level     Bathroom Shower/Tub: Walk-in Pensions consultantshower;Curtain   Bathroom Toilet: Standard     Home Equipment: Shower seat;Bedside commode   Additional Comments: Pt's sister present during interview, pt lives with sister and brother and someone is there all the time.       Prior Functioning/Environment Level of Independence: Needs assistance  Gait / Transfers Assistance Needed: Pt independently ambulating per sister report  ADL's / Homemaking Assistance Needed: Sister assists with bathing, sister assists with set-up for dressing, pt is independent for self-feeding, pt is independentl for toileting, but does not remember to change his brief         OT Diagnosis: Generalized weakness;Altered mental status   OT Problem List: Decreased strength;Decreased activity tolerance;Impaired balance (sitting and/or standing);Decreased safety awareness;Decreased knowledge of use of DME or AE;Decreased knowledge of precautions   OT Treatment/Interventions: Self-care/ADL training;Therapeutic exercise;Energy conservation;DME and/or AE instruction;Therapeutic activities;Cognitive remediation/compensation;Patient/family education;Balance training    OT Goals(Current goals can be found in the care plan section) Acute Rehab OT Goals Patient Stated Goal: none stated OT Goal Formulation: With family Time For Goal Achievement: 07/16/15 Potential to Achieve Goals: Good ADL Goals Pt Will Perform Grooming: with modified independence;standing Pt Will Perform Lower Body Bathing: with supervision;sit to/from  stand;with set-up Pt Will Perform Lower Body Dressing: with set-up;with supervision;sit to/from stand Pt Will Transfer to Toilet: with modified independence;ambulating;bedside commode Additional ADL Goal #1: Pt will be supervision for functional mobility/ambulation during ADL Additional ADL Goal #2: Pt will perform tasks with min verbal cueing and pt will follow commands/cues consistently   OT Frequency: Min 2X/week   Barriers to D/C: none known at this time   End of Session Equipment Utilized During Treatment: Gait belt Nurse Communication: Mobility status;Other (comment) (condom cath came off and low 02 sats reading)  Activity Tolerance: Patient tolerated treatment well Patient left: in chair;with call bell/phone within reach;with chair alarm set;with family/visitor present   Time: [id number] OT Time Calculation (min): 28 min Charges:  OT General Charges $OT Visit: 1 Procedure OT Evaluation $OT Eval Moderate Complexity: 1 Procedure OT Treatments $Self Care/Home Management : 8-22 mins G-Codes: OT G-codes **NOT FOR INPATIENT CLASS** Functional Limitation: Self care Self Care Current Status (W1191(G8987): At least 40 percent but less than 60 percent impaired, limited or restricted (min to mod assist) Self Care Goal Status (Y7829(G8988): At least 1 percent but less than 20 percent impaired, limited or restricted (supervision to mod I)  Edwin CapPatricia Gianella Chismar , MS, OTR/L, CLT Pager: [phone]  07/02/2015, 4:02 PM

## 2015-07-02 NOTE — Progress Notes (Signed)
[facility] PROGRESS NOTE  Eric Cameron WUJ:[id number] DOB: 1934-05-20 DOA: 07/01/2015 PCP: Glenetta Borg Eric Corti, NP  Assessment/Plan: Eric Cameron is Cameron 81 y.o. male has Cameron past medical history of Diabetes mellitus without complication (HCC); Hypertension; and Alzheimer disease. Was found in his bed unresponsive last time was seen normal last night. At his baseline he is able to walk on his own most of the time can dress himself with some minimal help his sister helps him bathe. He has history of dementia at baseline and usually confused. This morning he was responsive no fever no chest pain no other complaints. Patient has been in the fetal position ever since he was found. EMS was called he was brought then to emergency department.  1-Acute encephalopathy: unclear etiology. Work up in process. MRI, EEG pending.  Neurology consulted for evaluation and recommendation for anti seizure medications.  IV fluids.  Infectious work up: Chest x ray: Bibasilar atelectasis. No leukocytosis. Will check UA.  TSH low: will check free T 3 and free T4.  Check ammonia level.  Will start Thiamine and folate preventive.   2-HTN; resume medications when BP allows it and when able to tolerates oral.   3-Mild elevated troponin. Subsequent troponin negative. ECHO pending. EKG: with prolong Qt.  4-Prolong Qt; check mg level.  5-History of CVA; aspirin per rectum. Resume statins when tolerating oral.   Code Status: DNR Family Communication: none at bedside.  Disposition Plan: Remain in the step down for treatment of encephalopathy   Consultants:  Neurology   Procedures:  ECHO;  EEG    Antibiotics: None  HPI/Subjective: Patient lying in bed in fetal position. Only Answer yes to all questions.  confuse,  Objective: Filed Vitals:   07/02/15 0400 07/02/15 0501  BP:  100/50  Pulse: 79   Temp: 97.9 F (36.6 C)   Resp: 13 17    Intake/Output Summary (Last 24 hours) at 07/02/15  0818 Last data filed at 07/02/15 0500  Gross per 24 hour  Intake 616.33 ml  Output    450 ml  Net 166.33 ml   Filed Weights   07/01/15 1447 07/01/15 2156  Weight: 52.164 kg (115 lb) 51.8 kg (114 lb 3.2 oz)    Exam:   General:  NAD, lethargic  Cardiovascular: S 1, S 2 RRR  Respiratory: Crackles bases  Abdomen: BS present, soft, nt  Musculoskeletal: no edema  Data Reviewed: Basic Metabolic Panel:  Recent Labs Lab 07/01/15 1531 07/02/15 0405  NA 140 140  K 4.6 3.8  CL 100* 106  CO2 22 24  GLUCOSE 184* 87  BUN 23* 15  CREATININE 0.63 0.53*  CALCIUM 9.8 8.7*  MG  --  1.8  PHOS  --  2.8   Liver Function Tests:  Recent Labs Lab 07/01/15 1531 07/02/15 0405  AST 28 31  ALT 21 16*  ALKPHOS 61 45  BILITOT 0.8 0.8  PROT 8.2* 6.6  ALBUMIN 3.9 3.1*   No results for input(s): LIPASE, AMYLASE in the last 168 hours. No results for input(s): AMMONIA in the last 168 hours. CBC:  Recent Labs Lab 07/01/15 1531 07/02/15 0405  WBC 11.4* 8.7  HGB 14.0 11.4*  HCT 43.7 36.1*  MCV 92.6 92.3  PLT 235 212   Cardiac Enzymes:  Recent Labs Lab 07/01/15 1603 07/01/15 2159 07/02/15 0405  TROPONINI 0.07* <0.03 <0.03   BNP (last 3 results) No results for input(s): BNP in the last 8760 hours.  ProBNP (last 3 results) No results  for input(s): PROBNP in the last 8760 hours.  CBG:  Recent Labs Lab 07/02/15 0113 07/02/15 0449  GLUCAP 107* 77    Recent Results (from the past 240 hour(s))  MRSA PCR Screening     Status: None   Collection Time: 07/01/15 10:09 PM  Result Value Ref Range Status   MRSA by PCR NEGATIVE NEGATIVE Final    Comment:        The GeneXpert MRSA Assay (FDA approved for NASAL specimens only), is one component of Cameron comprehensive MRSA colonization surveillance program. It is not intended to diagnose MRSA infection nor to guide or monitor treatment for MRSA infections.      Studies: Ct Head Wo Contrast  07/01/2015  CLINICAL DATA:   Altered mental status.  Lethargic. EXAM: CT HEAD WITHOUT CONTRAST TECHNIQUE: Contiguous axial images were obtained from the base of the skull through the vertex without intravenous contrast. COMPARISON:  CT scan dated 11/30/2013 and MRI dated 12/01/2013 FINDINGS: No mass lesion. No midline shift. No acute hemorrhage or hematoma. No extra-axial fluid collections. No evidence of acute infarction. There is diffuse cerebral cortical atrophy with secondary slight ventricular dilatation. Extensive benign calcification along the interhemispheric falx. Bones are normal. Previous pontine infarcts demonstrated on MRI are not appreciable on this exam. IMPRESSION: No acute intracranial abnormality.  Diffuse atrophy. Electronically Signed   By: Francene BoyersJames  Maxwell M.D.   On: 07/01/2015 16:36   Dg Chest Portable 1 View  07/01/2015  CLINICAL DATA:  Altered mental status, was walking and talking yesterday evening, unable to do either today EXAM: PORTABLE CHEST 1 VIEW COMPARISON:  Portable exam 1602 hours compared 11/30/2013 FINDINGS: Normal heart size and pulmonary vascularity. Calcification and tortuosity of thoracic aorta. Bibasilar atelectasis. Skin fold projects over RIGHT lung. No definite acute infiltrate, pleural effusion or pneumothorax. Bones demineralized. IMPRESSION: Bibasilar atelectasis. Electronically Signed   By: Ulyses SouthwardMark  Boles M.D.   On: 07/01/2015 16:09    Scheduled Meds: . aspirin  300 mg Rectal Daily   Or  . aspirin  325 mg Oral Daily  . insulin aspart  0-9 Units Subcutaneous 6 times per day  . lacosamide  50 mg Oral BID  . sodium chloride flush  3 mL Intravenous Q12H  . thiamine  100 mg Intravenous Daily   Continuous Infusions:   Active Problems:   Acute encephalopathy   HTN (hypertension)   Diabetes mellitus type 2, uncontrolled (HCC)   Dementia   Seizures (HCC)   Elevated troponin   Dehydration    Time spent: 35 minutes.     Eric Cameron, Eric Cameron  [facility] Pager [phone]. If  7PM-7AM, please contact night-coverage at [email or web address], password [email or web address] 07/02/2015, 8:18 AM

## 2015-07-02 NOTE — Care Management Obs Status (Signed)
MEDICARE OBSERVATION STATUS NOTIFICATION   Patient Details  Name: Eric Cameron MRN: [id number] Date of Birth: [birth year]   Medicare Observation Status Notification Given:       Dietrich PatesMayo, Urho Rio T, RN 07/02/2015, 2:04 PM

## 2015-07-02 NOTE — Progress Notes (Signed)
EEG completed, results pending. 

## 2015-07-02 NOTE — Evaluation (Signed)
Physical Therapy Evaluation Patient Details Name: Eric Cameron MRN: [id number] DOB: 20-Nov-1934 Today's Date: 07/02/2015   History of Present Illness  Eric Cameron is a 81 y.o. male has a past medical history of Diabetes mellitus without complication (HCC); Hypertension; and Alzheimer disease. Was found in his bed unresponsive last time was seen normal last night. At his baseline he is able to walk on his own most of the time can dress himself with some minimal help his sister helps him bathe. He has history of dementia at baseline and usually confused. This morning he was responsive no fever no chest pain no other complaints. Patient has been in the fetal position ever since he was found.     Clinical Impression  Pt admitted with above diagnosis. Pt currently with functional limitations due to the deficits listed below (see PT Problem List). Pt has history of alzheimer's and is unable to provide history at this time. Pt is able to walk 350+ feet with hand hold assist but pt is not aware of his environment and answers all questions with "yep".  Pt will benefit from skilled PT to increase their independence and safety with mobility to allow discharge to the venue listed below. Due to pt's cognitive status and unaware of caregiver support or home environment, recommending SNF rehab at this time. This may change once history is received. PT tried calling his sister who is listed in the chart but unable to get in touch with her.     Follow Up Recommendations SNF;Supervision/Assistance - 24 hour    Equipment Recommendations  Other (comment) (TBD)    Recommendations for Other Services       Precautions / Restrictions Precautions Precautions: Fall Restrictions Weight Bearing Restrictions: No      Mobility  Bed Mobility Overal bed mobility: Needs Assistance Bed Mobility: Rolling;Sidelying to Sit Rolling: Min guard Sidelying to sit: Min guard       General bed mobility comments: Once  aroused, pt was able to roll over and come into sitting with use of rails.   Transfers Overall transfer level: Needs assistance Equipment used: 1 person hand held assist Transfers: Sit to/from Stand Sit to Stand: Min assist         General transfer comment: Min A for balance.   Ambulation/Gait Ambulation/Gait assistance: Min assist Ambulation Distance (Feet): 350 Feet Assistive device: 1 person hand held assist Gait Pattern/deviations: Step-through pattern;Decreased stride length;Scissoring;Drifts right/left;Trunk flexed;Narrow base of support Gait velocity: slightly decreased Gait velocity interpretation: Below normal speed for age/gender General Gait Details: Pt walks with very narrow BOS and scissors his feet. Pt needs 1 hand hold assitance for balance as pt has very unsteady gait.   Stairs            Wheelchair Mobility    Modified Rankin (Stroke Patients Only)       Balance Overall balance assessment: Needs assistance Sitting-balance support: No upper extremity supported;Feet supported Sitting balance-Leahy Scale: Fair Sitting balance - Comments: Pt able to sit with his hands in his lap on the EOB for short amount of time.    Standing balance support: Single extremity supported Standing balance-Leahy Scale: Fair Standing balance comment: Slight unsteadiness.                              Pertinent Vitals/Pain Pain Assessment: No/denies pain  Pre-Activity: HR: 71bpm, O2 Sat: 100% on room air, RR: 14bpm, BP: 103/63 mmHg Post-Activity: HR: 85bpm, O2  Sat: 100% on room air, RR: 14bpm, BP: 101/63 mmHg     Home Living Family/patient expects to be discharged to:: Unsure                 Additional Comments: Per chart pt lives with his sister. Pt unable to provide history.     Prior Function Level of Independence: Needs assistance   Gait / Transfers Assistance Needed: Per chart, pt was walking on his own PTA  ADL's / Homemaking Assistance  Needed: Per chart, sister helps him bathe but he can feed and dress himself.         Hand Dominance        Extremity/Trunk Assessment   Upper Extremity Assessment: Generalized weakness           Lower Extremity Assessment: Generalized weakness      Cervical / Trunk Assessment: Kyphotic  Communication   Communication: Receptive difficulties;Expressive difficulties;HOH  Cognition Arousal/Alertness: Lethargic Behavior During Therapy: Flat affect Overall Cognitive Status: History of cognitive impairments - at baseline       Memory: Decreased short-term memory              General Comments General comments (skin integrity, edema, etc.): Pt in fetal position upon entry to room. Neurologist came in to assessment and pt would only respond with "yep" to his questions and would not really follow commands. PT asked pt to sit up and he rolled right over and sat up. The once pt was up walking around he was a little more alert but still was unable to provide any history questions as pt is not oriented or aware of his environment.     Exercises        Assessment/Plan    PT Assessment Patient needs continued PT services  PT Diagnosis Difficulty walking;Abnormality of gait;Generalized weakness   PT Problem List Decreased strength;Decreased balance;Decreased coordination;Decreased knowledge of use of DME;Decreased safety awareness  PT Treatment Interventions Gait training;DME instruction;Functional mobility training;Therapeutic activities;Therapeutic exercise;Balance training;Patient/family education   PT Goals (Current goals can be found in the Care Plan section) Acute Rehab PT Goals Patient Stated Goal: none stated PT Goal Formulation: Patient unable to participate in goal setting Time For Goal Achievement: 07/16/15 Potential to Achieve Goals: Good    Frequency Min 3X/week   Barriers to discharge Other (comment) (unknown) Unsure of pt's home environment or caregiver  support at this time. Attempted to call his sister but unable to get in contact with her.     Co-evaluation               End of Session Equipment Utilized During Treatment: Gait belt Activity Tolerance: Patient tolerated treatment well Patient left: in chair;with call bell/phone within reach;with chair alarm set Nurse Communication: Mobility status    Functional Assessment Tool Used: clinical judgment Functional Limitation: Mobility: Walking and moving around Mobility: Walking and Moving Around Current Status (N8295): At least 1 percent but less than 20 percent impaired, limited or restricted Mobility: Walking and Moving Around Goal Status [phone]): At least 1 percent but less than 20 percent impaired, limited or restricted    Time: 0943-1000 PT Time Calculation (min) (ACUTE ONLY): 17 min   Charges:   PT Evaluation $PT Eval Moderate Complexity: 1 Procedure     PT G Codes:   PT G-Codes **NOT FOR INPATIENT CLASS** Functional Assessment Tool Used: clinical judgment Functional Limitation: Mobility: Walking and moving around Mobility: Walking and Moving Around Current Status (Q6578): At least 1 percent but  less than 20 percent impaired, limited or restricted Mobility: Walking and Moving Around Goal Status [phone](G8979): At least 1 percent but less than 20 percent impaired, limited or restricted   Everlean CherryJenna Harlene Petralia, SPT Everlean CherryJenna Dreyson Mishkin 07/02/2015, 11:07 AM

## 2015-07-02 NOTE — Consult Note (Signed)
NEURO HOSPITALIST CONSULT NOTE   Requestig physician: Dr. Sunnie Nielsen   Reason for Consult: AMS   HPI:                                                                                                                                          Eric Cameron is an 81 y.o. male found in his bed unresponsive last time was seen normal last night. At his baseline he is able to walk on his own most of the time can dress himself with some minimal help his sister helps him bathe. He has history of dementia at baseline and usually confused. This morning he was responsive no fever no chest pain no other complaints. Patient has been in the fetal position ever since he was found. EMS was called he was brought then to emergency department yesterday.While in ED Remains in the fetal position not responding not acting answering any questions not following any commands very difficult to arouse. CT scan of the head no acute findings or succession bibasal atelectasis. Urine appear to be concentrated. WBC 11.4 bicarbonate 22 BUN elevated at 23 troponin elevated at 0.07 lactic acid 1.59. Patient does have history of seizure with last one being 2015. History of CVA 2015 in [place]. EEG has been done and awaiting reading.   Currently he is in bed in fetal position and answers "yup" to everything. He does not follow verbal commands.   As I am writing this note PT got him up and he was walking in hallway. When asked what is wrong he stated " I do not know", he then was able to count my fingers and was following PT verbal commands.   Past Medical History  Diagnosis Date  . Diabetes mellitus without complication (HCC)   . Hypertension   . Alzheimer disease     History reviewed. No pertinent past surgical history.  Family History  Problem Relation Age of Onset  . Diabetes Mellitus II Brother   . Dementia Neg Hx   . Colon cancer Father       Social History:  reports that he has quit smoking. He  does not have any smokeless tobacco history on file. He reports that he does not drink alcohol or use illicit drugs.  No Known Allergies  MEDICATIONS:  Prior to Admission:  Prescriptions prior to admission  Medication Sig Dispense Refill Last Dose  . amLODipine-benazepril (LOTREL) 5-10 MG per capsule Take 1 capsule by mouth daily.    06/30/2015 at Unknown time  . aspirin 325 MG tablet Take 1 tablet (325 mg total) by mouth daily.   06/30/2015 at Unknown time  . atorvastatin (LIPITOR) 10 MG tablet Take 10 mg by mouth daily at 6 PM.   06/30/2015 at Unknown time  . citalopram (CELEXA) 10 MG tablet Take 10 mg by mouth daily.    06/30/2015 at Unknown time  . INVOKAMET 50-500 MG TABS Take 1 tablet by mouth 2 (two) times daily.  3 06/30/2015 at Unknown time  . memantine (NAMENDA) 10 MG tablet Take 10 mg by mouth 2 (two) times daily.    06/30/2015 at Unknown time  . lacosamide (VIMPAT) 50 MG TABS tablet Take 1 tablet (50 mg total) by mouth 2 (two) times daily. 60 tablet 0 Taking   Scheduled: . aspirin  300 mg Rectal Daily   Or  . aspirin  325 mg Oral Daily  . folic acid  1 mg Intravenous Daily  . insulin aspart  0-9 Units Subcutaneous 6 times per day  . lacosamide  50 mg Oral BID  . sodium chloride flush  3 mL Intravenous Q12H  . thiamine IV  100 mg Intravenous Daily     ROS:                                                                                                                                       History obtained from unobtainable from patient due to mental status     Blood pressure 94/54, pulse 71, temperature 97.9 F (36.6 C), temperature source Axillary, resp. rate 17, height  (1.753 m), weight 51.8 kg (114 lb 3.2 oz), SpO2 100 %.   Neurologic Examination:                                                                                                       HEENT-  Normocephalic, no lesions, without obvious abnormality.  Normal external eye and conjunctiva.  Normal TM's bilaterally.  Normal auditory canals and external ears. Normal external nose, mucus membranes and septum.  Normal pharynx. Cardiovascular- S1, S2 normal, pulses palpable throughout   Lungs- chest clear, no wheezing, rales, normal symmetric air entry Abdomen- normal findings: bowel sounds normal Extremities- no edema Lymph-no adenopathy palpable Musculoskeletal-no joint tenderness, deformity or swelling Skin-warm and  dry, no hyperpigmentation, vitiligo, or suspicious lesions  Neurological Examination Mental Status: Alert, in fetal position, follows no verbal commands or visual. If arms and legs stretched out he will leave them out for a little while. States "yup" to everything.  Cranial Nerves: II: holds eye clenched shut when attempting to do exam and could not get a good EOMI or visual exam.  pupils equal, round, reactive  III,IV, VI: ptosis not present but holding the left eye shut. extra-ocular motions intact bilaterally V,VII: smile symmetric, facial light touch sensation normal bilaterally VIII: hearing normal bilaterally IX,X: uvula rises symmetrically XI: bilateral shoulder shrug XII: midline tongue extension Motor: Moving all extremities with 5/5 strength Sensory: Pinprick and light touch intact throughout, bilaterally Deep Tendon Reflexes: 2+ and symmetric throughout UE and no KJ or AJ Plantars: Right: downgoing   Left: downgoing Cerebellar: Unable to obtain Gait: needs two person assist      Lab Results: Basic Metabolic Panel:  Recent Labs Lab 07/01/15 1531 07/02/15 0405  NA 140 140  K 4.6 3.8  CL 100* 106  CO2 22 24  GLUCOSE 184* 87  BUN 23* 15  CREATININE 0.63 0.53*  CALCIUM 9.8 8.7*  MG  --  1.8  PHOS  --  2.8    Liver Function Tests:  Recent Labs Lab 07/01/15 1531 07/02/15 0405  AST 28 31  ALT 21 16*  ALKPHOS 61 45  BILITOT 0.8  0.8  PROT 8.2* 6.6  ALBUMIN 3.9 3.1*   No results for input(s): LIPASE, AMYLASE in the last 168 hours. No results for input(s): AMMONIA in the last 168 hours.  CBC:  Recent Labs Lab 07/01/15 1531 07/02/15 0405  WBC 11.4* 8.7  HGB 14.0 11.4*  HCT 43.7 36.1*  MCV 92.6 92.3  PLT 235 212    Cardiac Enzymes:  Recent Labs Lab 07/01/15 1603 07/01/15 2159 07/02/15 0405  TROPONINI 0.07* <0.03 <0.03    Lipid Panel:  Recent Labs Lab 07/02/15 0406  CHOL 133  TRIG 30  HDL 58  CHOLHDL 2.3  VLDL 6  LDLCALC 69    CBG:  Recent Labs Lab 07/02/15 0113 07/02/15 0449 07/02/15 0853  GLUCAP 107* 77 74    Microbiology: Results for orders placed or performed during the hospital encounter of 07/01/15  MRSA PCR Screening     Status: None   Collection Time: 07/01/15 10:09 PM  Result Value Ref Range Status   MRSA by PCR NEGATIVE NEGATIVE Final    Comment:        The GeneXpert MRSA Assay (FDA approved for NASAL specimens only), is one component of a comprehensive MRSA colonization surveillance program. It is not intended to diagnose MRSA infection nor to guide or monitor treatment for MRSA infections.     Coagulation Studies: No results for input(s): LABPROT, INR in the last 72 hours.  Imaging: Ct Head Wo Contrast  07/01/2015  CLINICAL DATA:  Altered mental status.  Lethargic. EXAM: CT HEAD WITHOUT CONTRAST TECHNIQUE: Contiguous axial images were obtained from the base of the skull through the vertex without intravenous contrast. COMPARISON:  CT scan dated 11/30/2013 and MRI dated 12/01/2013 FINDINGS: No mass lesion. No midline shift. No acute hemorrhage or hematoma. No extra-axial fluid collections. No evidence of acute infarction. There is diffuse cerebral cortical atrophy with secondary slight ventricular dilatation. Extensive benign calcification along the interhemispheric falx. Bones are normal. Previous pontine infarcts demonstrated on MRI are not appreciable on  this exam. IMPRESSION: No acute intracranial abnormality.  Diffuse  atrophy. Electronically Signed   By: Francene Boyers M.D.   On: 07/01/2015 16:36   Dg Chest Portable 1 View  07/01/2015  CLINICAL DATA:  Altered mental status, was walking and talking yesterday evening, unable to do either today EXAM: PORTABLE CHEST 1 VIEW COMPARISON:  Portable exam 1602 hours compared 11/30/2013 FINDINGS: Normal heart size and pulmonary vascularity. Calcification and tortuosity of thoracic aorta. Bibasilar atelectasis. Skin fold projects over RIGHT lung. No definite acute infiltrate, pleural effusion or pneumothorax. Bones demineralized. IMPRESSION: Bibasilar atelectasis. Electronically Signed   By: Ulyses Southward M.D.   On: 07/01/2015 16:09       Assessment and plan per attending neurologist  Felicie Morn PA-C [facility] [phone]  07/02/2015, 9:40 AM   Assessment/Plan:  81 YO male who was brought to hospital after he was noted to be in fetal position and not responding as usual. He has a history of Alzheimer disease. On exam initially he would not follow commands but while writing the note PT was able to get him up and he followed commands and walked in the hall. He would not give a reason why he was in fetal position. EEG pending and MRI pending. Likely progression of AD and mood but due to Hx of SZ and CVA agree with EEG and MRI to rule out central neurologic issues. Have also ordered a Ammonia and ANA. Agree with PT and OT while in hospital.

## 2015-07-02 NOTE — Progress Notes (Signed)
Initial Nutrition Assessment  DOCUMENTATION CODES:   Severe malnutrition in context of chronic illness  INTERVENTION:  -Boost Breeze po TID, each supplement provides 250 kcal and 9 grams of protein -RD to continue to monitor  DIAGNOSIS:   Malnutrition related to chronic illness as evidenced by severe depletion of muscle mass, severe depletion of body fat.  GOAL:   Patient will meet greater than or equal to 90% of their needs  MONITOR:   PO intake, I & O's, Labs, Skin  REASON FOR ASSESSMENT:   Low Braden    ASSESSMENT:   Eric Cameron is a 81 yo male has a past medical history of Diabetes mellitus without complication (HCC); Hypertension; and Alzheimer disease. Was found in his bed unresponsive last time was seen normal last night. At his baseline he is able to walk on his own most of the time can dress himself with some minimal help his sister helps him bathe. He has history of dementia at baseline and usually confused.  Eric Cameron suffers from AMS > but occasionally he will answer questions with something other than a "yup." He declined Ensure, will try boost breeze. Appears he comes in and out of lucidity. He is on NDD3 - Thin Liquids. Per chart he is usually able to walk on his own, get dressed, and sometimes his sister helps him bathe.  Nutrition-Focused physical exam completed. Findings are severe fat depletion, severe muscle depletion, and no edema.   Given patient's mentation - significant PO intake is unlikely. Follow for PO & GOC.   Labs: Unremarkable Medications reviewed.   Diet Order:  DIET DYS 3 Room service appropriate?: Yes; Fluid consistency:: Thin  Skin:  Reviewed, no issues  Last BM:  PTA  Height:   Ht Readings from Last 1 Encounters:  07/01/15 5\' 9"  (1.753 m)    Weight:   Wt Readings from Last 1 Encounters:  07/01/15 114 lb 3.2 oz (51.8 kg)    Ideal Body Weight:  72.72 kg  BMI:  Body mass index is 16.86 kg/(m^2).  Estimated  Nutritional Needs:   Kcal:  1550-1850  Protein:  50-60 grams  Fluid:  >/= 1.5L  EDUCATION NEEDS:   No education needs identified at this time  Dionne AnoWilliam M. Estel Tonelli, MS, RD LDN After Hours/Weekend Pager [phone]

## 2015-07-02 NOTE — Evaluation (Signed)
Speech Language Pathology Evaluation Patient Details Name: Eric Cameron MRN: [id number] DOB: 1935/01/05 Today's Date: 07/02/2015 Time: 4098-1191 SLP Time Calculation (min) (ACUTE ONLY): 7 min  Problem List:  Patient Active Problem List   Diagnosis Date Noted  . Elevated troponin 07/01/2015  . Dehydration 07/01/2015  . Seizures (HCC) 12/03/2013  . Unspecified cerebral artery occlusion with cerebral infarction 12/01/2013  . Altered mental status 11/30/2013  . Acute encephalopathy 11/30/2013  . HTN (hypertension) 11/30/2013  . Diabetes mellitus type 2, uncontrolled (HCC) 11/30/2013  . Dementia 11/30/2013  . HLD (hyperlipidemia) 11/30/2013   Past Medical History:  Past Medical History  Diagnosis Date  . Diabetes mellitus without complication (HCC)   . Hypertension   . Alzheimer disease    Past Surgical History: History reviewed. No pertinent past surgical history. HPI:  Eric Cameron is a 81 y.o. male has a past medical history of Diabetes mellitus without complication (HCC); Hypertension; and Alzheimer disease. Was found in his bed unresponsive last time was seen normal last night. At his baseline he is able to walk on his own most of the time can dress himself with some minimal help his sister helps him bathe. He has history of dementia at baseline and usually confused. This morning he was responsive no fever no chest pain no other complaints. Patient has been in the fetal position ever since he was found. He had an MBS in 2015 that recommended Dys 3 diet and thin liquids, no aspiration/penetration observed although with oral dysphagia consistent with cognitive impairments.   Assessment / Plan / Recommendation Clinical Impression  Pt currently is disoriented x4 with reduced sustained attention and recall of daily events. He requires Min cues for one-step commands within functional tasks, but Max cues for basic functional problem solving. Insight into deficits and safety awareness is  poor. It is unclear what his baseline level of function is. SLP to f/u briefly to determine if cognitive therapy is warranted for the aforementioned deficits pending additional information about baseline function and further medical work up.    SLP Assessment  Patient needs continued Speech Lanaguage Pathology Services    Follow Up Recommendations  24 hour supervision/assistance;Skilled Nursing facility    Frequency and Duration min 2x/week  1 week      SLP Evaluation Prior Functioning  Cognitive/Linguistic Baseline: Baseline deficits Baseline deficit details: unclear what baseline level of function was but documented h/o alzheimers   Cognition  Overall Cognitive Status: No family/caregiver present to determine baseline cognitive functioning Arousal/Alertness: Awake/alert Orientation Level: Disoriented X4 Attention: Sustained Sustained Attention: Impaired Sustained Attention Impairment: Verbal basic;Functional basic Memory: Impaired Memory Impairment: Decreased recall of new information Awareness: Impaired Awareness Impairment: Intellectual impairment;Emergent impairment;Anticipatory impairment Problem Solving: Impaired Problem Solving Impairment: Functional basic Behaviors: Impulsive Safety/Judgment: Impaired    Comprehension  Auditory Comprehension Overall Auditory Comprehension: Impaired Yes/No Questions: Impaired Basic Biographical Questions: 26-50% accurate Basic Immediate Environment Questions: 50-74% accurate Commands: Impaired One Step Basic Commands: 75-100% accurate    Expression Expression Primary Mode of Expression: Verbal Verbal Expression Overall Verbal Expression:  (limited verbal output)   Oral / Motor  Oral Motor/Sensory Function Overall Oral Motor/Sensory Function: Within functional limits (during functional tasks) Motor Speech Overall Motor Speech: Appears within functional limits for tasks assessed (across minimal output)   GO           Functional Assessment Tool Used: skilled clinical judgment Functional Limitations: Memory Swallow Current Status (Y7829): At least 1 percent but less than 20 percent impaired, limited or restricted  Swallow Goal Status [phone](G8997): At least 1 percent but less than 20 percent impaired, limited or restricted Swallow Discharge Status [phone](G8998): At least 1 percent but less than 20 percent impaired, limited or restricted Memory Current Status (W2956(G9168): At least 80 percent but less than 100 percent impaired, limited or restricted Memory Goal Status (O1308(G9169): At least 60 percent but less than 80 percent impaired, limited or restricted          Maxcine HamLaura Paiewonsky, M.A. CCC-SLP [phone]  Maxcine Hamaiewonsky, Frans Valente 07/02/2015, 11:27 AM

## 2015-07-02 NOTE — Progress Notes (Signed)
[facility]  Patient Status: Active (receiving services up to time of hospitalization)  [facility] is providing the following services: RN and OT  If patient discharges after hours, please call [phone].   Eric FurnishDonna Fellmy 07/02/2015, 5:38 PM

## 2015-07-02 NOTE — Procedures (Signed)
History: Eric Cameron is an 81 y.o. male patient with altered mental staCornelia Copatus. Routine inpatient EEG was performed for further evaluation.   Patient Active Problem List   Diagnosis Date Noted  . Elevated troponin 07/01/2015  . Dehydration 07/01/2015  . Seizures (HCC) 12/03/2013  . Unspecified cerebral artery occlusion with cerebral infarction 12/01/2013  . Altered mental status 11/30/2013  . Acute encephalopathy 11/30/2013  . HTN (hypertension) 11/30/2013  . Diabetes mellitus type 2, uncontrolled (HCC) 11/30/2013  . Dementia 11/30/2013  . HLD (hyperlipidemia) 11/30/2013     Current facility-administered medications:  .  0.9 %  sodium chloride infusion, , Intravenous, Continuous, Belkys A Regalado, MD, Last Rate: 100 mL/hr at 07/02/15 1032 .  acetaminophen (TYLENOL) tablet 650 mg, 650 mg, Oral, Q6H PRN **OR** acetaminophen (TYLENOL) suppository 650 mg, 650 mg, Rectal, Q6H PRN, Therisa DoyneAnastassia Doutova, MD .  aspirin suppository 300 mg, 300 mg, Rectal, Daily **OR** aspirin tablet 325 mg, 325 mg, Oral, Daily, Therisa DoyneAnastassia Doutova, MD, 325 mg at 07/02/15 1100 .  folic acid injection 1 mg, 1 mg, Intravenous, Daily, Belkys A Regalado, MD, 1 mg at 07/02/15 1031 .  insulin aspart (novoLOG) injection 0-9 Units, 0-9 Units, Subcutaneous, 6 times per day, Therisa DoyneAnastassia Doutova, MD, 2 Units at 07/02/15 1613 .  lacosamide (VIMPAT) tablet 50 mg, 50 mg, Oral, BID, Belkys A Regalado, MD, 50 mg at 07/02/15 1100 .  LORazepam (ATIVAN) injection 1 mg, 1 mg, Intravenous, Q6H PRN, Belkys A Regalado, MD .  ondansetron (ZOFRAN) tablet 4 mg, 4 mg, Oral, Q6H PRN **OR** ondansetron (ZOFRAN) injection 4 mg, 4 mg, Intravenous, Q6H PRN, Therisa DoyneAnastassia Doutova, MD .  sodium chloride flush (NS) 0.9 % injection 3 mL, 3 mL, Intravenous, Q12H, Therisa DoyneAnastassia Doutova, MD, 3 mL at 07/01/15 2225 .  thiamine (B-1) injection 100 mg, 100 mg, Intravenous, Daily, Belkys A Regalado, MD, 100 mg at 07/02/15 1022   Introduction:  This is a 19 channel  routine scalp EEG performed at the bedside with bipolar and monopolar montages arranged in accordance to the international 10/20 system of electrode placement. One channel was dedicated to EKG recording.   Findings:  Generalized background slowing is noted in mid theta range. No definite evidence of abnormal epileptiform discharges or electrographic seizures were noted during this recording.   Impression:  Abnormal routine inpatient EEG suggestive of encephalopathy. Clinical correlation is recommended .

## 2015-07-02 NOTE — Progress Notes (Signed)
VASCULAR LAB PRELIMINARY  PRELIMINARY  PRELIMINARY  PRELIMINARY  Carotid duplex  completed.    Preliminary report:  Bilateral:  1-39% ICA stenosis.  Vertebral artery flow is antegrade.      Alisson Rozell, RVT 07/02/2015, 3:54 PM

## 2015-07-02 NOTE — Progress Notes (Signed)
CSW saw recommendation for SNF- pt is not appropriate candidate at this time and his needs are more custodial/ supervision  Pt is observation status- would need 3 night qualifying stay for Medicare to cover if pt did have any skilled needs.  Per RNCM who had spoken to pt sister- pt lives alone but pt sister and pt brother have been providing some supervision at home  No further CSW involvement at this time- please reconsult if further needs arrive  Merlyn LotJenna Holoman, [facility] Willow Rock CenterCSWA Clinical Social Worker [phone]

## 2015-07-02 NOTE — Evaluation (Signed)
Clinical/Bedside Swallow Evaluation Patient Details  Name: Eric Cameron MRN: [id number] Date of Birth: [birth year]  Today's Date: 07/02/2015 Time: SLP Start Time (ACUTE ONLY): 1049 SLP Stop Time (ACUTE ONLY): 1057 SLP Time Calculation (min) (ACUTE ONLY): 8 min  Past Medical History:  Past Medical History  Diagnosis Date  . Diabetes mellitus without complication (HCC)   . Hypertension   . Alzheimer disease    Past Surgical History: History reviewed. No pertinent past surgical history. HPI:  Eric Cameron is a 81 y.o. male has a past medical history of Diabetes mellitus without complication (HCC); Hypertension; and Alzheimer disease. Was found in his bed unresponsive last time was seen normal last night. At his baseline he is able to walk on his own most of the time can dress himself with some minimal help his sister helps him bathe. He has history of dementia at baseline and usually confused. This morning he was responsive no fever no chest pain no other complaints. Patient has been in the fetal position ever since he was found. He had an MBS in 2015 that recommended Dys 3 diet and thin liquids, no aspiration/penetration observed although with oral dysphagia consistent with cognitive impairments.   Assessment / Plan / Recommendation Clinical Impression  Pt's oropharyngeal swallow appears consistent with MBS completed in 2015, at which time he had a mild, cognitively-based oral dysphaghia but did not have any airway compromise. No overt s/s of aspiration observed today, although he did require Mod-Max A for basic problem solving and slower pacing throughout self-feeding. Recommend Dys 3 diet and thin liquids but with full supervision due to mentation. No f/u indicated for swallowing.    Aspiration Risk  Mild aspiration risk    Diet Recommendation Dysphagia 3 (Mech soft);Thin liquid   Liquid Administration via: Cup;Straw Medication Administration: Whole meds with puree Supervision:  Patient able to self feed;Full supervision/cueing for compensatory strategies Compensations: Minimize environmental distractions;Slow rate;Small sips/bites Postural Changes: Seated upright at 90 degrees    Other  Recommendations Oral Care Recommendations: Oral care BID   Follow up Recommendations  24 hour supervision/assistance    Frequency and Duration            Prognosis        Swallow Study   General HPI: Eric Cameron is a 81 y.o. male has a past medical history of Diabetes mellitus without complication (HCC); Hypertension; and Alzheimer disease. Was found in his bed unresponsive last time was seen normal last night. At his baseline he is able to walk on his own most of the time can dress himself with some minimal help his sister helps him bathe. He has history of dementia at baseline and usually confused. This morning he was responsive no fever no chest pain no other complaints. Patient has been in the fetal position ever since he was found. He had an MBS in 2015 that recommended Dys 3 diet and thin liquids, no aspiration/penetration observed although with oral dysphagia consistent with cognitive impairments. Type of Study: Bedside Swallow Evaluation Previous Swallow Assessment: see HPI Diet Prior to this Study: NPO Temperature Spikes Noted: Yes (99.9) Respiratory Status: Room air History of Recent Intubation: No Behavior/Cognition: Alert;Cooperative;Requires cueing Oral Cavity Assessment: Dry Oral Cavity - Dentition: Poor condition;Missing dentition Vision: Functional for self-feeding Self-Feeding Abilities: Able to feed self;Other (Comment) (needs assist due to impulsivity, poor problem solving) Patient Positioning: Upright in chair Baseline Vocal Quality: Normal    Oral/Motor/Sensory Function Overall Oral Motor/Sensory Function: Within functional limits (during functional tasks)  Ice Chips Ice chips: Not tested   Thin Liquid Thin Liquid: Within functional  limits Presentation: Cup;Self Fed;Straw    Nectar Thick Nectar Thick Liquid: Not tested   Honey Thick Honey Thick Liquid: Not tested   Puree Puree: Within functional limits Presentation: Self Fed;Spoon   Solid   GO   Solid: Impaired Presentation: Self Fed Oral Phase Functional Implications: Impaired mastication    Functional Assessment Tool Used: skilled clinical judgment Functional Limitations: Swallowing Swallow Current Status (Z6109): At least 1 percent but less than 20 percent impaired, limited or restricted Swallow Goal Status [phone]): At least 1 percent but less than 20 percent impaired, limited or restricted Swallow Discharge Status [phone]): At least 1 percent but less than 20 percent impaired, limited or restricted   Maxcine Ham, M.A. CCC-SLP [phone]  Maxcine Ham 07/02/2015,11:21 AM

## 2015-07-03 ENCOUNTER — Ambulatory Visit (HOSPITAL_COMMUNITY): Payer: Medicare Other

## 2015-07-03 DIAGNOSIS — I6789 Other cerebrovascular disease: Secondary | ICD-10-CM

## 2015-07-03 LAB — CBC
HEMATOCRIT: 31.9 % — AB (ref 39.0–52.0)
HEMATOCRIT: 33.7 % — AB (ref 39.0–52.0)
HEMOGLOBIN: 10.5 g/dL — AB (ref 13.0–17.0)
Hemoglobin: 11.1 g/dL — ABNORMAL LOW (ref 13.0–17.0)
MCH: 30.1 pg (ref 26.0–34.0)
MCH: 30.3 pg (ref 26.0–34.0)
MCHC: 32.9 g/dL (ref 30.0–36.0)
MCHC: 32.9 g/dL (ref 30.0–36.0)
MCV: 91.4 fL (ref 78.0–100.0)
MCV: 92.1 fL (ref 78.0–100.0)
Platelets: 202 10*3/uL (ref 150–400)
Platelets: 189 10*3/uL (ref 150–400)
RBC: 3.49 MIL/uL — ABNORMAL LOW (ref 4.22–5.81)
RBC: 3.66 MIL/uL — ABNORMAL LOW (ref 4.22–5.81)
RDW: 12.9 % (ref 11.5–15.5)
RDW: 12.8 % (ref 11.5–15.5)
WBC: 7.5 10*3/uL (ref 4.0–10.5)
WBC: 8.9 10*3/uL (ref 4.0–10.5)

## 2015-07-03 LAB — ECHOCARDIOGRAM COMPLETE
Height: 69 in
WEIGHTICAEL: 1827.17 [oz_av]

## 2015-07-03 LAB — BASIC METABOLIC PANEL
ANION GAP: 9 (ref 5–15)
Anion gap: 8 (ref 5–15)
BUN: 12 mg/dL (ref 6–20)
BUN: 11 mg/dL (ref 6–20)
CALCIUM: 8.6 mg/dL — AB (ref 8.9–10.3)
CHLORIDE: 114 mmol/L — AB (ref 101–111)
CHLORIDE: 111 mmol/L (ref 101–111)
CO2: 23 mmol/L (ref 22–32)
CO2: 25 mmol/L (ref 22–32)
CREATININE: 0.39 mg/dL — AB (ref 0.61–1.24)
CREATININE: 0.51 mg/dL — AB (ref 0.61–1.24)
Calcium: 8.6 mg/dL — ABNORMAL LOW (ref 8.9–10.3)
GFR calc Af Amer: >60 mL/min (ref >60)
GFR calc non Af Amer: >60 mL/min (ref >60)
GFR calc non Af Amer: >60 mL/min (ref >60)
GLUCOSE: 131 mg/dL — AB (ref 65–99)
Glucose, Bld: 89 mg/dL (ref 65–99)
POTASSIUM: 3.2 mmol/L — AB (ref 3.5–5.1)
Potassium: 2.9 mmol/L — ABNORMAL LOW (ref 3.5–5.1)
SODIUM: 146 mmol/L — AB (ref 135–145)
Sodium: 144 mmol/L (ref 135–145)

## 2015-07-03 LAB — URINE MICROSCOPIC-ADD ON

## 2015-07-03 LAB — GLUCOSE, CAPILLARY
GLUCOSE-CAPILLARY: 123 mg/dL — AB (ref 65–99)
GLUCOSE-CAPILLARY: 129 mg/dL — AB (ref 65–99)
GLUCOSE-CAPILLARY: 180 mg/dL — AB (ref 65–99)
Glucose-Capillary: 98 mg/dL (ref 65–99)
Glucose-Capillary: 113 mg/dL — ABNORMAL HIGH (ref 65–99)
Glucose-Capillary: 109 mg/dL — ABNORMAL HIGH (ref 65–99)

## 2015-07-03 LAB — URINALYSIS, ROUTINE W REFLEX MICROSCOPIC
Bilirubin Urine: NEGATIVE
Glucose, UA: >1000 mg/dL — AB
Ketones, ur: 15 mg/dL — AB
NITRITE: NEGATIVE
PH: 5.5 (ref 5.0–8.0)
Protein, ur: NEGATIVE mg/dL
SPECIFIC GRAVITY, URINE: 1.035 — AB (ref 1.005–1.030)

## 2015-07-03 LAB — TROPONIN I

## 2015-07-03 LAB — T3, FREE: T3, Free: 1.7 pg/mL — ABNORMAL LOW (ref 2.0–4.4)

## 2015-07-03 LAB — HEMOGLOBIN A1C
HEMOGLOBIN A1C: 5.5 % (ref 4.8–5.6)
MEAN PLASMA GLUCOSE: 111 mg/dL

## 2015-07-03 LAB — ANTINUCLEAR ANTIBODIES, IFA: ANTINUCLEAR ANTIBODIES, IFA: NEGATIVE

## 2015-07-03 MED ORDER — DEXTROSE 5 % IV SOLN
INTRAVENOUS | Status: DC
  Administered 2015-07-03: 11:00:00 via INTRAVENOUS

## 2015-07-03 MED ORDER — ATORVASTATIN CALCIUM 10 MG PO TABS
10.0000 mg | ORAL_TABLET | Freq: Every day | ORAL | Status: DC
  Administered 2015-07-03 – 2015-07-09 (×7): 10 mg via ORAL
  Filled 2015-07-03 (×7): qty 1

## 2015-07-03 MED ORDER — MEMANTINE HCL 10 MG PO TABS
10.0000 mg | ORAL_TABLET | Freq: Two times a day (BID) | ORAL | Status: DC
Start: 2015-07-03 — End: 2015-07-10
  Administered 2015-07-03 – 2015-07-10 (×15): 10 mg via ORAL
  Filled 2015-07-03 (×23): qty 1

## 2015-07-03 MED ORDER — CITALOPRAM HYDROBROMIDE 20 MG PO TABS
10.0000 mg | ORAL_TABLET | Freq: Every day | ORAL | Status: DC
  Administered 2015-07-03 – 2015-07-10 (×8): 10 mg via ORAL
  Filled 2015-07-03 (×8): qty 1

## 2015-07-03 MED ORDER — SODIUM CHLORIDE 0.45 % IV SOLN
INTRAVENOUS | Status: DC
  Administered 2015-07-03 – 2015-07-10 (×8): via INTRAVENOUS

## 2015-07-03 MED ORDER — POTASSIUM CHLORIDE CRYS ER 20 MEQ PO TBCR
40.0000 meq | EXTENDED_RELEASE_TABLET | ORAL | Status: AC
  Administered 2015-07-03 (×2): 40 meq via ORAL
  Filled 2015-07-03 (×2): qty 2

## 2015-07-03 MED ORDER — LEVOTHYROXINE SODIUM 25 MCG PO TABS
25.0000 ug | ORAL_TABLET | Freq: Every day | ORAL | Status: DC
  Administered 2015-07-04 – 2015-07-10 (×7): 25 ug via ORAL
  Filled 2015-07-03 (×7): qty 1

## 2015-07-03 MED ORDER — FOLIC ACID 1 MG PO TABS
1.0000 mg | ORAL_TABLET | Freq: Every day | ORAL | Status: DC
  Administered 2015-07-03 – 2015-07-10 (×8): 1 mg via ORAL
  Filled 2015-07-03 (×8): qty 1

## 2015-07-03 MED ORDER — VITAMIN B-1 100 MG PO TABS
100.0000 mg | ORAL_TABLET | Freq: Every day | ORAL | Status: DC
  Administered 2015-07-04 – 2015-07-10 (×7): 100 mg via ORAL
  Filled 2015-07-03 (×7): qty 1

## 2015-07-03 NOTE — Progress Notes (Signed)
Report attempted *2

## 2015-07-03 NOTE — Progress Notes (Addendum)
Physical Therapy Treatment Patient Details Name: Eric Cameron MRN: [id number] DOB: 07/05/1934 Today's Date: 07/03/2015    History of Present Illness Eric Cameron is a 81 y.o. male has a past medical history of Diabetes mellitus without complication (HCC); Hypertension; and Alzheimer disease. Was found in his bed unresponsive last time was seen normal last night. At his baseline he is able to walk on his own most of the time can dress himself with some minimal help his sister helps him bathe. He has history of dementia at baseline and usually confused. This morning he was responsive no fever no chest pain no other complaints. Patient has been in the fetal position ever since he was found.     PT Comments    Pt admitted with above diagnosis. Pt currently with functional limitations due to the deficits listed below (see PT Problem List). Pt able to ambulate with min assist with several LOB needing assistance by PT.  Pt impulsive with poor safety.  Chart states pt will have 24 hour care therefore recommendations for [facility] if that is so however there is question whether pt has 24 hour care.  If pt does not have 24 hour care, will need SNF.  Pt will benefit from skilled PT to increase their independence and safety with mobility to allow discharge to the venue listed below.    Follow Up Recommendations  SNF;Supervision/Assistance - 24 hour (Per chart, sister and brother provide 24 hr care, if so HHPT, HHOT and HHAide)     Equipment Recommendations  Other (comment) (TBD)    Recommendations for Other Services       Precautions / Restrictions Precautions Precautions: Fall Restrictions Weight Bearing Restrictions: No    Mobility  Bed Mobility               General bed mobility comments: Pt in chair on arrival  Transfers Overall transfer level: Needs assistance Equipment used: 1 person hand held assist Transfers: Sit to/from Stand Sit to Stand: Min assist         General transfer  comment: Min A for balance. Posterior lean most of time and staggers all directions with and without device. Poor safety.  Ambulation/Gait Ambulation/Gait assistance: Min assist Ambulation Distance (Feet): 350 Feet Assistive device: 1 person hand held assist Gait Pattern/deviations: Step-through pattern;Decreased stride length;Scissoring;Drifts right/left;Trunk flexed;Narrow base of support Gait velocity: slightly decreased Gait velocity interpretation: Below normal speed for age/gender General Gait Details: Pt walks with very narrow BOS and scissors his feet. Pt needs 1 hand hold assitance for balance as pt has very unsteady gait. Even with RW, pt continues to be unsteady and needs min assist.  Per chart, sister and brother stay with pt.    Stairs            Wheelchair Mobility    Modified Rankin (Stroke Patients Only)       Balance Overall balance assessment: Needs assistance Sitting-balance support: No upper extremity supported;Feet supported Sitting balance-Leahy Scale: Good     Standing balance support: Single extremity supported;During functional activity Standing balance-Leahy Scale: Poor Standing balance comment: relieson UE support for balance.                     Cognition Arousal/Alertness: Awake/alert Behavior During Therapy: Flat affect Overall Cognitive Status: Impaired/Different from baseline Area of Impairment: Orientation;Attention;Memory;Following commands;Safety/judgement;Awareness;Problem solving Orientation Level: Disoriented to;Place;Time;Situation Current Attention Level: Sustained Memory: Decreased short-term memory Following Commands: Follows one step commands inconsistently Safety/Judgement: Decreased awareness of safety;Decreased awareness  of deficits Awareness: Intellectual Problem Solving: Slow processing;Difficulty sequencing;Requires verbal cues;Requires tactile cues      Exercises      General Comments        Pertinent  Vitals/Pain Pain Assessment: No/denies pain  VSS    Home Living                      Prior Function            PT Goals (current goals can now be found in the care plan section) Progress towards PT goals: Progressing toward goals    Frequency  Min 3X/week    PT Plan Current plan appropriate    Co-evaluation             End of Session Equipment Utilized During Treatment: Gait belt Activity Tolerance: Patient tolerated treatment well Patient left: in chair;with call bell/phone within reach;with chair alarm set     Time: [id number] PT Time Calculation (min) (ACUTE ONLY): 11 min  Charges:  $Gait Training: 8-22 mins                    G CodesBerline Lopes:      [name] F 07/03/2015, 3:12 PM Enter[employer] CorporationDawn Aleksandra Raben,PT Acute Rehabilitation [phone] (pager)

## 2015-07-03 NOTE — Progress Notes (Signed)
Report attempted *1. Nurse to call back

## 2015-07-03 NOTE — Progress Notes (Signed)
Occupational Therapy Treatment Patient Details Name: Eric Cameron MRN: [id number] DOB: [birth year] Today's Date: 07/03/2015    History of present illness Eric Cameron is a 81 y.o. male has a past medical history of Diabetes mellitus without complication (HCC); Hypertension; and Alzheimer disease. Was found in his bed unresponsive last time was seen normal last night. At his baseline he is able to walk on his own most of the time can dress himself with some minimal help his sister helps him bathe. He has history of dementia at baseline and usually confused. This morning he was responsive no fever no chest pain no other complaints. Patient has been in the fetal position ever since he was found.    OT comments  Pt demonstrates decr visual scanning ( unable to locate tooth brush or cup on sink surface with active attempt to find them) Pt demonstrates decr awareness to balance deficits and heavy (A) from therapist to transfer due to balance deficits. Pt is very high fall risk and should not transfer alone.   MD- Please re-consult SW if patient has 3 day stay for placement. Latest notes report sign off by SW and pt lives ALONE   Follow Up Recommendations  SNF;Other (comment) (lives ALONE)    Equipment Recommendations  None recommended by OT    Recommendations for Other Services  SOCIAL WORKER    Precautions / Restrictions Precautions Precautions: Fall Precaution Comments: watch o2 levels Restrictions Weight Bearing Restrictions: No       Mobility Bed Mobility Overal bed mobility: Needs Assistance Bed Mobility: Supine to Sit Rolling: Min guard         General bed mobility comments: definite use of bed rails  Transfers Overall transfer level: Needs assistance Equipment used: 1 person hand held assist Transfers: Sit to/from Stand Sit to Stand: Mod assist         General transfer comment: strong R favor and posterior lean    Balance Overall balance assessment: Needs  assistance Sitting-balance support: No upper extremity supported;Feet supported Sitting balance-Leahy Scale: Good     Standing balance support: No upper extremity supported;During functional activity Standing balance-Leahy Scale: Zero Standing balance comment: relieson UE support for balance.                    ADL Overall ADL's : Needs assistance/impaired     Grooming: Wash/dry face;Moderate assistance;Standing Grooming Details (indicate cue type and reason): pt wtih strong posterior lean and R lean. Required (A) to maintain static standing                 Toilet Transfer: Moderate assistance             General ADL Comments: Pt transfer with (A) for balance visual attention and safety awareness. Pt demonstrates decr visual scanning and visual attention to task      Vision                     Perception     Praxis      Cognition   Behavior During Therapy: Flat affect Overall Cognitive Status: History of cognitive impairments - at baseline Area of Impairment: Orientation;Attention;Memory;Following commands;Safety/judgement;Awareness;Problem solving Orientation Level: Disoriented to;Time;Situation (hospital "i think") Current Attention Level: Sustained Memory: Decreased recall of precautions;Decreased short-term memory  Following Commands: Follows one step commands inconsistently Safety/Judgement: Decreased awareness of safety;Decreased awareness of deficits Awareness: Intellectual Problem Solving: Slow processing;Decreased initiation;Difficulty sequencing General Comments: pt attempting to step over foley / lines due to  lack of awareness to tubing attached to body ( visual deficits see below) Pt unable to recall what was happening on tv program patient had very intense focus on upon OT arrival. pt no awareness to month or date ( November) pt when cued [employer] Patricks day what month is that in ? I dont know Pt unable to recall "green color" associated  with St Patricks day. Pt report tooth brush in room "thats someone elses"    Extremity/Trunk Assessment               Exercises     Shoulder Instructions       General Comments      Pertinent Vitals/ Pain       Pain Assessment: No/denies pain  Home Living                                          Prior Functioning/Environment              Frequency Min 2X/week     Progress Toward Goals  OT Goals(current goals can now be found in the care plan section)  Progress towards OT goals: Progressing toward goals  Acute Rehab OT Goals Patient Stated Goal: none stated OT Goal Formulation: With family Time For Goal Achievement: 07/16/15 Potential to Achieve Goals: Good ADL Goals Pt Will Perform Grooming: with modified independence;standing Pt Will Perform Lower Body Bathing: with supervision;sit to/from stand;with set-up Pt Will Perform Lower Body Dressing: with set-up;with supervision;sit to/from stand Pt Will Transfer to Toilet: with modified independence;ambulating;bedside commode Additional ADL Goal #1: Pt will be supervision for functional mobility/ambulation during ADL Additional ADL Goal #2: Pt will perform tasks with min verbal cueing and pt will follow commands/cues consistently   Plan Discharge plan needs to be updated    Co-evaluation                 End of Session Equipment Utilized During Treatment: Gait belt   Activity Tolerance Patient tolerated treatment well   Patient Left in chair;with call bell/phone within reach;with chair alarm set;with family/visitor present   Nurse Communication Mobility status;Other (comment)        Time: 1610-9604 OT Time Calculation (min): 15 min  Charges: OT General Charges $OT Visit: 1 Procedure OT Treatments $Self Care/Home Management : 8-22 mins  Boone Master B 07/03/2015, 3:17 PM   Mateo Flow   OTR/L Pager: [phone] Office: [phone] .

## 2015-07-03 NOTE — Progress Notes (Signed)
Pt transferred to the unit. Pt is stable, alert and oriented per baseline. Oriented to room, staff, and call bell as much as possible. Educated to call for any assistance. Bed in lowest position, call bell within reach- will continue to monitor.

## 2015-07-03 NOTE — CV Procedure (Deleted)
Unable to perform echocardiogram, [name] is off the floor.   Leta Junglingiffany Sade Hollon RDCS, 07/03/15 2:51 PM

## 2015-07-03 NOTE — Progress Notes (Signed)
  Echocardiogram 2D Echocardiogram has been performed.  Leta JunglingCooper, Valon Glasscock M 07/03/2015, 3:52 PM

## 2015-07-03 NOTE — Progress Notes (Addendum)
[facility] PROGRESS NOTE  Eric Cameron DOB: 17-May-1934 DOA: 07/01/2015 PCP: Glenetta BorgSmothers, Cathleen Cortieborah N, NP  Assessment/Plan: Eric Cameron is a 81 y.o. male has a past medical history of Diabetes mellitus without complication (HCC); Hypertension; and Alzheimer disease. Was found in his bed unresponsive last time was seen normal last night. At his baseline he is able to walk on his own most of the time can dress himself with some minimal help his sister helps him bathe. He has history of dementia at baseline and usually confused. This morning he was responsive no fever no chest pain no other complaints. Patient has been in the fetal position ever since he was found. EMS was called he was brought then to emergency department.  1-Acute encephalopathy: Unclear etiology. Work up in process. MRI negative, EEG negative for seizure.  Neurology consulted for evaluation and recommendation for anti seizure medications.  IV fluids.  Infectious work up: Chest x ray: Bibasilar atelectasis. No leukocytosis. Will check UA.  TSH low: free T 3 low at 1.7 and free T4 normal. Will start low dose synthroid.  ammonia level 28  Continue with Thiamine and folate preventive.  Improving, might be related to progression of dementia.   2-HTN; resume medications when BP allows it and when able to tolerates oral.   3-Mild elevated troponin. Subsequent troponin negative. ECHO pending. EKG: with prolong Qt.  4-Prolong Qt; mg level normal.  5-History of CVA; aspirin  Resume statins when tolerating oral.  6-Hypothyroidism; will start low dose synthroid, to see if this would help with encephalopathy, dementia.  7-Hypokalemia, hypernatremia; change IV fluids to half NS. Replete k.  8-DM; SSI. Hold oral hypoglycemic agent.  9-History of seizure; continue with vimpat.   Code Status: DNR Family Communication: none at bedside.  Disposition Plan; transfer to med-surgery    Consultants:  Neurology    Procedures:  ECHO;  EEG    Antibiotics: None  HPI/Subjective: Sitting, eating breakfast , pleasantly confused. .   Objective: Filed Vitals:   07/03/15 0700 07/03/15 0930  BP: 95/63 103/65  Pulse:  79  Temp: 98.3 F (36.8 C)   Resp: 14 20    Intake/Output Summary (Last 24 hours) at 07/03/15 0932 Last data filed at 07/03/15 0450  Gross per 24 hour  Intake 833.33 ml  Output    700 ml  Net 133.33 ml   Filed Weights   07/01/15 1447 07/01/15 2156  Weight: 52.164 kg (115 lb) 51.8 kg (114 lb 3.2 oz)    Exam:   General:  NAD, lethargic  Cardiovascular: S 1, S 2 RRR  Respiratory: Crackles bases  Abdomen: BS present, soft, nt  Musculoskeletal: no edema  Data Reviewed: Basic Metabolic Panel:  Recent Labs Lab 07/01/15 1531 07/02/15 0405 07/02/15 1122 07/03/15 0731  NA 140 140  --  146*  K 4.6 3.8  --  2.9*  CL 100* 106  --  114*  CO2 22 24  --  23  GLUCOSE 184* 87  --  89  BUN 23* 15  --  12  CREATININE 0.63 0.53*  --  0.39*  CALCIUM 9.8 8.7*  --  8.6*  MG  --  1.8 1.9  --   PHOS  --  2.8  --   --    Liver Function Tests:  Recent Labs Lab 07/01/15 1531 07/02/15 0405  AST 28 31  ALT 21 16*  ALKPHOS 61 45  BILITOT 0.8 0.8  PROT 8.2* 6.6  ALBUMIN 3.9 3.1*  No results for input(s): LIPASE, AMYLASE in the last 168 hours.  Recent Labs Lab 07/02/15 0843  AMMONIA 28   CBC:  Recent Labs Lab 07/01/15 1531 07/02/15 0405 07/03/15 0731  WBC 11.4* 8.7 7.5  HGB 14.0 11.4* 10.5*  HCT 43.7 36.1* 31.9*  MCV 92.6 92.3 91.4  PLT 235 212 202   Cardiac Enzymes:  Recent Labs Lab 07/02/15 0405 07/02/15 1122 07/02/15 1835 07/03/15 0015 07/03/15 0731  TROPONINI <0.03 <0.03 0.03 <0.03 <0.03   BNP (last 3 results) No results for input(s): BNP in the last 8760 hours.  ProBNP (last 3 results) No results for input(s): PROBNP in the last 8760 hours.  CBG:  Recent Labs Lab 07/02/15 1551 07/02/15 2107 07/03/15 0054 07/03/15 0519  07/03/15 0727  GLUCAP 192* 214* 129* 98 113*    Recent Results (from the past 240 hour(s))  MRSA PCR Screening     Status: None   Collection Time: 07/01/15 10:09 PM  Result Value Ref Range Status   MRSA by PCR NEGATIVE NEGATIVE Final    Comment:        The GeneXpert MRSA Assay (FDA approved for NASAL specimens only), is one component of a comprehensive MRSA colonization surveillance program. It is not intended to diagnose MRSA infection nor to guide or monitor treatment for MRSA infections.      Studies: Ct Head Wo Contrast  07/01/2015  CLINICAL DATA:  Altered mental status.  Lethargic. EXAM: CT HEAD WITHOUT CONTRAST TECHNIQUE: Contiguous axial images were obtained from the base of the skull through the vertex without intravenous contrast. COMPARISON:  CT scan dated 11/30/2013 and MRI dated 12/01/2013 FINDINGS: No mass lesion. No midline shift. No acute hemorrhage or hematoma. No extra-axial fluid collections. No evidence of acute infarction. There is diffuse cerebral cortical atrophy with secondary slight ventricular dilatation. Extensive benign calcification along the interhemispheric falx. Bones are normal. Previous pontine infarcts demonstrated on MRI are not appreciable on this exam. IMPRESSION: No acute intracranial abnormality.  Diffuse atrophy. Electronically Signed   By: Francene Boyers M.D.   On: 07/01/2015 16:36   Mr Brain Wo Contrast  07/02/2015  CLINICAL DATA:  Initial evaluation for altered mental status. EXAM: MRI HEAD WITHOUT CONTRAST TECHNIQUE: Multiplanar, multiecho pulse sequences of the brain and surrounding structures were obtained without intravenous contrast. COMPARISON:  Prior CT from 07/01/2015. FINDINGS: Study mildly degraded by motion artifact. Diffuse prominence of the CSF containing spaces is compatible with generalized age-related cerebral atrophy. Patchy T2/FLAIR hyperintensity within the central pons likely related to chronic small vessel ischemic disease.  Probable minimal small vessel type changes present within the periventricular white matter as well. No abnormal foci of restricted diffusion to suggest acute intracranial infarct. Major intracranial vascular flow voids are maintained. No acute or chronic intracranial hemorrhage. No areas of chronic infarction. No mass lesion, midline shift, or mass effect. Ventricular prominence related to global parenchymal volume loss without hydrocephalus. No extra-axial fluid collection. Craniocervical junction within normal limits. Scattered multilevel degenerative spondylolysis within the visualized upper cervical spine without significant stenosis. Pituitary gland grossly normal. No acute abnormality about the orbits. Paranasal sinuses are grossly clear. No mastoid effusion. Inner ear structures grossly normal. Bone marrow signal intensity within normal limits. No scalp soft tissue abnormality. IMPRESSION: 1. No acute intracranial process identified. 2. Mild age-related cerebral atrophy with chronic small vessel ischemic disease. Electronically Signed   By: Rise Mu M.D.   On: 07/02/2015 22:31   Dg Chest Portable 1 View  07/01/2015  CLINICAL  DATA:  Altered mental status, was walking and talking yesterday evening, unable to do either today EXAM: PORTABLE CHEST 1 VIEW COMPARISON:  Portable exam 1602 hours compared 11/30/2013 FINDINGS: Normal heart size and pulmonary vascularity. Calcification and tortuosity of thoracic aorta. Bibasilar atelectasis. Skin fold projects over RIGHT lung. No definite acute infiltrate, pleural effusion or pneumothorax. Bones demineralized. IMPRESSION: Bibasilar atelectasis. Electronically Signed   By: Ulyses Southward M.D.   On: 07/01/2015 16:09    Scheduled Meds: . aspirin  300 mg Rectal Daily   Or  . aspirin  325 mg Oral Daily  . citalopram  10 mg Oral Daily  . folic acid  1 mg Intravenous Daily  . insulin aspart  0-9 Units Subcutaneous 6 times per day  . lacosamide  50 mg Oral  BID  . memantine  10 mg Oral BID  . potassium chloride  40 mEq Oral Q4H  . sodium chloride flush  3 mL Intravenous Q12H  . thiamine IV  100 mg Intravenous Daily   Continuous Infusions: . sodium chloride    . dextrose      Active Problems:   Acute encephalopathy   HTN (hypertension)   Diabetes mellitus type 2, uncontrolled (HCC)   Dementia   Seizures (HCC)   Elevated troponin   Dehydration    Time spent: 35 minutes.     Hartley Barefoot A  [facility] Pager [phone]. If 7PM-7AM, please contact night-coverage at [email or web address], password [facility] 07/03/2015, 9:32 AM  LOS: 1 day

## 2015-07-03 NOTE — Progress Notes (Signed)
Interval History:                                                                                                                      Eric CopaRobert Cameron is an 81 y.o. male patient with A[facility] in the [facility]. He was initially in fetal position but PT was able to get him walking and talking while in hospital Today he is completely different. He is sitting up eating breakfast well and very talkative.       Past Medical History: Past Medical History  Diagnosis Date  . Diabetes mellitus without complication (HCC)   . Hypertension   . Alzheimer disease     History reviewed. No pertinent past surgical history.  Family History: Family History  Problem Relation Age of Onset  . Diabetes Mellitus II Brother   . Dementia Neg Hx   . Colon cancer Father     Social History:   reports that he has quit smoking. He does not have any smokeless tobacco history on file. He reports that he does not drink alcohol or use illicit drugs.  Allergies:  No Known Allergies   Medications:                                                                                                                         Current facility-administered medications:  .  acetaminophen (TYLENOL) tablet 650 mg, 650 mg, Oral, Q6H PRN **OR** acetaminophen (TYLENOL) suppository 650 mg, 650 mg, Rectal, Q6H PRN, Therisa DoyneAnastassia Doutova, MD .  aspirin suppository 300 mg, 300 mg, Rectal, Daily **OR** aspirin tablet 325 mg, 325 mg, Oral, Daily, Therisa DoyneAnastassia Doutova, MD, 325 mg at 07/02/15 1100 .  dextrose 5 % solution, , Intravenous, Continuous, Belkys A Regalado, MD .  folic acid injection 1 mg, 1 mg, Intravenous, Daily, Belkys A Regalado, MD, 1 mg at 07/02/15 1031 .  insulin aspart (novoLOG) injection 0-9 Units, 0-9 Units, Subcutaneous, 6 times per day, Therisa DoyneAnastassia Doutova, MD, 1 Units at 07/03/15 0109 .  lacosamide (VIMPAT) tablet 50 mg, 50 mg, Oral, BID, Belkys A Regalado, MD, 50 mg at 07/02/15 2250 .  LORazepam (ATIVAN) injection 1 mg, 1 mg,  Intravenous, Q6H PRN, Belkys A Regalado, MD .  ondansetron (ZOFRAN) tablet 4 mg, 4 mg, Oral, Q6H PRN **OR** ondansetron (ZOFRAN) injection 4 mg, 4 mg, Intravenous, Q6H PRN, Therisa DoyneAnastassia Doutova, MD .  potassium chloride SA (K-DUR,KLOR-CON) CR tablet 40 mEq, 40 mEq, Oral, Q4H, Belkys A Regalado, MD .  sodium chloride  flush (NS) 0.9 % injection 3 mL, 3 mL, Intravenous, Q12H, Therisa Doyne, MD, 3 mL at 07/02/15 2251 .  thiamine (B-1) injection 100 mg, 100 mg, Intravenous, Daily, Belkys A Regalado, MD, 100 mg at 07/02/15 1022   Neurologic Examination:                                                                                                     Today's Vitals   07/03/15 0400 07/03/15 0500 07/03/15 0600 07/03/15 0700  BP:   102/64 95/63  Pulse: 74 74    Temp:    98.3 F (36.8 C)  TempSrc:    Oral  Resp: Height:      Weight:      SpO2: 98% 97%  96%  PainSc:        Evaluation of higher integrative functions including: Level of alertness: Alert,  Not oriented to time, place and person Speech:dysarthric but adentulus, or aphasia noted.  Test the following cranial nerves: 2-12 grossly intact Motor examination: Normal tone, bulk, full 5/5 motor strength in all 4 extremities Examination of sensation : Normal and symmetric sensation to pinprick in all 4 extremities and on face Examination of deep tendon reflexes: 1+, normal and symmetric in all extremities, normal plantars bilaterally Test coordination: Normal finger nose testing,  Lab Results: Basic Metabolic Panel:  Recent Labs Lab 07/01/15 1531 07/02/15 0405 07/02/15 1122 07/03/15 0731  NA 140 140  --  146*  K 4.6 3.8  --  2.9*  CL 100* 106  --  114*  CO2 22 24  --  23  GLUCOSE 184* 87  --  89  BUN 23* 15  --  12  CREATININE 0.63 0.53*  --  0.39*  CALCIUM 9.8 8.7*  --  8.6*  MG  --  1.8 1.9  --   PHOS  --  2.8  --   --     Liver Function Tests:  Recent Labs Lab 07/01/15 1531 07/02/15 0405  AST 28  31  ALT 21 16*  ALKPHOS 61 45  BILITOT 0.8 0.8  PROT 8.2* 6.6  ALBUMIN 3.9 3.1*   No results for input(s): LIPASE, AMYLASE in the last 168 hours.  Recent Labs Lab 07/02/15 0843  AMMONIA 28    CBC:  Recent Labs Lab 07/01/15 1531 07/02/15 0405 07/03/15 0731  WBC 11.4* 8.7 7.5  HGB 14.0 11.4* 10.5*  HCT 43.7 36.1* 31.9*  MCV 92.6 92.3 91.4  PLT 235 212 202    Cardiac Enzymes:  Recent Labs Lab 07/02/15 0405 07/02/15 1122 07/02/15 1835 07/03/15 0015 07/03/15 0731  TROPONINI <0.03 <0.03 0.03 <0.03 <0.03    Lipid Panel:  Recent Labs Lab 07/02/15 0406  CHOL 133  TRIG 30  HDL 58  CHOLHDL 2.3  VLDL 6  LDLCALC 69    CBG:  Recent Labs Lab 07/02/15 1551 07/02/15 2107 07/03/15 0054 07/03/15 0519 07/03/15 0727  GLUCAP 192* 214* 129* 98 113*    Microbiology: Results for orders placed or performed during the hospital encounter of 07/01/15  MRSA PCR Screening     Status: None   Collection Time: 07/01/15 10:09 PM  Result Value Ref Range Status   MRSA by PCR NEGATIVE NEGATIVE Final    Comment:        The GeneXpert MRSA Assay (FDA approved for NASAL specimens only), is one component of a comprehensive MRSA colonization surveillance program. It is not intended to diagnose MRSA infection nor to guide or monitor treatment for MRSA infections.     Imaging: Ct Head Wo Contrast  07/01/2015  CLINICAL DATA:  Altered mental status.  Lethargic. EXAM: CT HEAD WITHOUT CONTRAST TECHNIQUE: Contiguous axial images were obtained from the base of the skull through the vertex without intravenous contrast. COMPARISON:  CT scan dated 11/30/2013 and MRI dated 12/01/2013 FINDINGS: No mass lesion. No midline shift. No acute hemorrhage or hematoma. No extra-axial fluid collections. No evidence of acute infarction. There is diffuse cerebral cortical atrophy with secondary slight ventricular dilatation. Extensive benign calcification along the interhemispheric falx. Bones are  normal. Previous pontine infarcts demonstrated on MRI are not appreciable on this exam. IMPRESSION: No acute intracranial abnormality.  Diffuse atrophy. Electronically Signed   By: Francene Boyers M.D.   On: 07/01/2015 16:36   Mr Brain Wo Contrast  07/02/2015  CLINICAL DATA:  Initial evaluation for altered mental status. EXAM: MRI HEAD WITHOUT CONTRAST TECHNIQUE: Multiplanar, multiecho pulse sequences of the brain and surrounding structures were obtained without intravenous contrast. COMPARISON:  Prior CT from 07/01/2015. FINDINGS: Study mildly degraded by motion artifact. Diffuse prominence of the CSF containing spaces is compatible with generalized age-related cerebral atrophy. Patchy T2/FLAIR hyperintensity within the central pons likely related to chronic small vessel ischemic disease. Probable minimal small vessel type changes present within the periventricular white matter as well. No abnormal foci of restricted diffusion to suggest acute intracranial infarct. Major intracranial vascular flow voids are maintained. No acute or chronic intracranial hemorrhage. No areas of chronic infarction. No mass lesion, midline shift, or mass effect. Ventricular prominence related to global parenchymal volume loss without hydrocephalus. No extra-axial fluid collection. Craniocervical junction within normal limits. Scattered multilevel degenerative spondylolysis within the visualized upper cervical spine without significant stenosis. Pituitary gland grossly normal. No acute abnormality about the orbits. Paranasal sinuses are grossly clear. No mastoid effusion. Inner ear structures grossly normal. Bone marrow signal intensity within normal limits. No scalp soft tissue abnormality. IMPRESSION: 1. No acute intracranial process identified. 2. Mild age-related cerebral atrophy with chronic small vessel ischemic disease. Electronically Signed   By: Rise Mu M.D.   On: 07/02/2015 22:31   Dg Chest Portable 1  View  07/01/2015  CLINICAL DATA:  Altered mental status, was walking and talking yesterday evening, unable to do either today EXAM: PORTABLE CHEST 1 VIEW COMPARISON:  Portable exam 1602 hours compared 11/30/2013 FINDINGS: Normal heart size and pulmonary vascularity. Calcification and tortuosity of thoracic aorta. Bibasilar atelectasis. Skin fold projects over RIGHT lung. No definite acute infiltrate, pleural effusion or pneumothorax. Bones demineralized. IMPRESSION: Bibasilar atelectasis. Electronically Signed   By: Ulyses Southward M.D.   On: 07/01/2015 16:09    Assessment and plan:   Eric Cameron is an 81 y.o. male patient with AMS which has no cleared. EEG showed background slowing suggestive of encephalopathy, no abnormal discharges or seizures noted. Extensive artifacts on the EEG due to poor cooperation. MRI brain is negative for acute pathology. AMS likely progression of his dementia as he has no metabolic, imaging or EEG findings to support his AMS. Currently he  is hypokalemic and would recommend treating this.   Neurology S/O

## 2015-07-04 ENCOUNTER — Inpatient Hospital Stay (HOSPITAL_COMMUNITY): Payer: Medicare Other

## 2015-07-04 LAB — BASIC METABOLIC PANEL
Anion gap: 9 (ref 5–15)
BUN: 6 mg/dL (ref 6–20)
CHLORIDE: 106 mmol/L (ref 101–111)
CO2: 22 mmol/L (ref 22–32)
Calcium: 8.4 mg/dL — ABNORMAL LOW (ref 8.9–10.3)
Creatinine, Ser: 0.35 mg/dL — ABNORMAL LOW (ref 0.61–1.24)
Glucose, Bld: 109 mg/dL — ABNORMAL HIGH (ref 65–99)
POTASSIUM: 3.3 mmol/L — AB (ref 3.5–5.1)
SODIUM: 137 mmol/L (ref 135–145)

## 2015-07-04 LAB — GLUCOSE, CAPILLARY
GLUCOSE-CAPILLARY: 103 mg/dL — AB (ref 65–99)
GLUCOSE-CAPILLARY: 107 mg/dL — AB (ref 65–99)
GLUCOSE-CAPILLARY: 84 mg/dL (ref 65–99)
GLUCOSE-CAPILLARY: 183 mg/dL — AB (ref 65–99)
Glucose-Capillary: 157 mg/dL — ABNORMAL HIGH (ref 65–99)
Glucose-Capillary: 173 mg/dL — ABNORMAL HIGH (ref 65–99)

## 2015-07-04 MED ORDER — INFLUENZA VAC SPLIT QUAD 0.5 ML IM SUSY
0.5000 mL | PREFILLED_SYRINGE | INTRAMUSCULAR | Status: AC
  Administered 2015-07-07: 0.5 mL via INTRAMUSCULAR
  Filled 2015-07-04: qty 0.5

## 2015-07-04 MED ORDER — POTASSIUM CHLORIDE CRYS ER 20 MEQ PO TBCR
40.0000 meq | EXTENDED_RELEASE_TABLET | Freq: Three times a day (TID) | ORAL | Status: AC
Start: 2015-07-04 — End: 2015-07-04
  Administered 2015-07-04 (×3): 40 meq via ORAL
  Filled 2015-07-04 (×3): qty 2

## 2015-07-04 NOTE — Progress Notes (Signed)
[facility] PROGRESS NOTE  Eric Cameron:[id number] DOB: June 01, 1934 DOA: 07/01/2015 PCP: Glenetta Borg [name], NP  Assessment/Plan: Daxten Kovalenko is a 81 y.o. male has a past medical history of Diabetes mellitus without complication (HCC); Hypertension; and Alzheimer disease. Was found in his bed unresponsive last time was seen normal last night. At his baseline he is able to walk on his own most of the time can dress himself with some minimal help his sister helps him bathe. He has history of dementia at baseline and usually confused. This morning he was responsive no fever no chest pain no other complaints. Patient has been in the fetal position ever since he was found. EMS was called he was brought then to emergency department.  1-Acute encephalopathy: Unclear etiology. Work up in process. MRI negative, EEG negative for seizure.  Neurology consulted for evaluation and recommendation for anti seizure medications.  IV fluids.  Infectious work up: Chest x ray: Bibasilar atelectasis. No leukocytosis.  TSH low: free T 3 low at 1.7 and free T4 normal. Will start low dose synthroid.  ammonia level 28  Continue with Thiamine and folate preventive.  Improving, might be related to progression of dementia.  UA with 6-30 WBC, check urine culture.   2-HTN; resume medications when BP allows it and when able to tolerates oral.   3-Mild elevated troponin. Subsequent troponin negative. ECHO normal EF. EKG: with prolong Qt.  4-Prolong Qt; mg level normal.  5-History of CVA; aspirin  Resume statins when tolerating oral.  6-Hypothyroidism; will start low dose synthroid, to see if this would help with encephalopathy, dementia.  7-Hypokalemia, hypernatremia; change IV fluids to half NS. Replete k.  8-DM; SSI. Hold oral hypoglycemic agent.  9-History of seizure; continue with vimpat.   Code Status: DNR Family Communication: none at bedside.  Disposition Plan; need SNF, await urine culture.     Consultants:  Neurology   Procedures:  ECHO;  EEG    Antibiotics: None  HPI/Subjective: Sitting in chair , pleasantly confused. .   Objective: Filed Vitals:   07/03/15 2345 07/04/15 0442  BP: 116/67 112/58  Pulse: 78 71  Temp: 98.4 F (36.9 C) 98.4 F (36.9 C)  Resp: 18 18    Intake/Output Summary (Last 24 hours) at 07/04/15 1427 Last data filed at 07/04/15 0903  Gross per 24 hour  Intake   1800 ml  Output    350 ml  Net   1450 ml   Filed Weights   07/01/15 1447 07/01/15 2156 07/03/15 1743  Weight: 52.164 kg (115 lb) 51.8 kg (114 lb 3.2 oz) 51.8 kg (114 lb 3.2 oz)    Exam:   General:  NAD  Cardiovascular: S 1, S 2 RRR  Respiratory: Crackles bases  Abdomen: BS present, soft, nt  Musculoskeletal: no edema  Data Reviewed: Basic Metabolic Panel:  Recent Labs Lab 07/01/15 1531 07/02/15 0405 07/02/15 1122 07/03/15 0731 07/03/15 1229 07/04/15 0830  NA 140 140  --  146* 144 137  K 4.6 3.8  --  2.9* 3.2* 3.3*  CL 100* 106  --  114* 111 106  CO2 22 24  --  GLUCOSE 184* 87  --  89 131* 109*  BUN 23* 15  --  CREATININE 0.63 0.53*  --  0.39* 0.51* 0.35*  CALCIUM 9.8 8.7*  --  8.6* 8.6* 8.4*  MG  --  1.8 1.9  --   --   --   PHOS  --  2.8  --   --   --   --    Liver Function Tests:  Recent Labs Lab 07/01/15 1531 07/02/15 0405  AST 28 31  ALT 21 16*  ALKPHOS 61 45  BILITOT 0.8 0.8  PROT 8.2* 6.6  ALBUMIN 3.9 3.1*   No results for input(s): LIPASE, AMYLASE in the last 168 hours.  Recent Labs Lab 07/02/15 0843  AMMONIA 28   CBC:  Recent Labs Lab 07/01/15 1531 07/02/15 0405 07/03/15 0731 07/03/15 1229  WBC 11.4* 8.7 7.5 8.9  HGB 14.0 11.4* 10.5* 11.1*  HCT 43.7 36.1* 31.9* 33.7*  MCV 92.6 92.3 91.4 92.1  PLT 235 212 202 189   Cardiac Enzymes:  Recent Labs Lab 07/02/15 1122 07/02/15 1835 07/03/15 0015 07/03/15 0731 07/03/15 1229  TROPONINI <0.03 0.03 <0.03 <0.03 <0.03   BNP (last 3  results) No results for input(s): BNP in the last 8760 hours.  ProBNP (last 3 results) No results for input(s): PROBNP in the last 8760 hours.  CBG:  Recent Labs Lab 07/03/15 2010 07/03/15 2344 07/04/15 0410 07/04/15 0751 07/04/15 1146  GLUCAP 103* 180* 107* 84 157*    Recent Results (from the past 240 hour(s))  MRSA PCR Screening     Status: None   Collection Time: 07/01/15 10:09 PM  Result Value Ref Range Status   MRSA by PCR NEGATIVE NEGATIVE Final    Comment:        The GeneXpert MRSA Assay (FDA approved for NASAL specimens only), is one component of a comprehensive MRSA colonization surveillance program. It is not intended to diagnose MRSA infection nor to guide or monitor treatment for MRSA infections.      Studies: Mr Brain Wo Contrast  07/02/2015  CLINICAL DATA:  Initial evaluation for altered mental status. EXAM: MRI HEAD WITHOUT CONTRAST TECHNIQUE: Multiplanar, multiecho pulse sequences of the brain and surrounding structures were obtained without intravenous contrast. COMPARISON:  Prior CT from 07/01/2015. FINDINGS: Study mildly degraded by motion artifact. Diffuse prominence of the CSF containing spaces is compatible with generalized age-related cerebral atrophy. Patchy T2/FLAIR hyperintensity within the central pons likely related to chronic small vessel ischemic disease. Probable minimal small vessel type changes present within the periventricular white matter as well. No abnormal foci of restricted diffusion to suggest acute intracranial infarct. Major intracranial vascular flow voids are maintained. No acute or chronic intracranial hemorrhage. No areas of chronic infarction. No mass lesion, midline shift, or mass effect. Ventricular prominence related to global parenchymal volume loss without hydrocephalus. No extra-axial fluid collection. Craniocervical junction within normal limits. Scattered multilevel degenerative spondylolysis within the visualized upper  cervical spine without significant stenosis. Pituitary gland grossly normal. No acute abnormality about the orbits. Paranasal sinuses are grossly clear. No mastoid effusion. Inner ear structures grossly normal. Bone marrow signal intensity within normal limits. No scalp soft tissue abnormality. IMPRESSION: 1. No acute intracranial process identified. 2. Mild age-related cerebral atrophy with chronic small vessel ischemic disease. Electronically Signed   By: Rise MuBenjamin  McClintock M.D.   On: 07/02/2015 22:31    Scheduled Meds: . aspirin  300 mg Rectal Daily   Or  . aspirin  325 mg Oral Daily  . atorvastatin  10 mg Oral q1800  . citalopram  10 mg Oral Daily  . folic acid  1 mg Oral Daily  . [START ON 07/05/2015] Influenza vac split quadrivalent PF  0.5 mL Intramuscular Tomorrow-1000  . insulin aspart  0-9 Units Subcutaneous 6 times per day  . lacosamide  50 mg Oral BID  . levothyroxine  25 mcg Oral QAC breakfast  . memantine  10 mg Oral BID  . potassium chloride  40 mEq Oral TID  . sodium chloride flush  3 mL Intravenous Q12H  . thiamine  100 mg Oral Daily   Continuous Infusions: . sodium chloride 75 mL/hr at 07/04/15 1406    Active Problems:   Acute encephalopathy   HTN (hypertension)   Diabetes mellitus type 2, uncontrolled (HCC)   Dementia   Seizures (HCC)   Elevated troponin   Dehydration    Time spent: 35 minutes.     Hartley Barefoot A  [facility] Pager [phone]. If 7PM-7AM, please contact night-coverage at [email or web address], password [facility] 07/04/2015, 2:27 PM  LOS: 2 days

## 2015-07-05 LAB — GLUCOSE, CAPILLARY
GLUCOSE-CAPILLARY: 122 mg/dL — AB (ref 65–99)
GLUCOSE-CAPILLARY: 121 mg/dL — AB (ref 65–99)
GLUCOSE-CAPILLARY: 78 mg/dL (ref 65–99)
GLUCOSE-CAPILLARY: 90 mg/dL (ref 65–99)
Glucose-Capillary: 103 mg/dL — ABNORMAL HIGH (ref 65–99)
Glucose-Capillary: 206 mg/dL — ABNORMAL HIGH (ref 65–99)

## 2015-07-05 LAB — BASIC METABOLIC PANEL
ANION GAP: 11 (ref 5–15)
BUN: 7 mg/dL (ref 6–20)
CALCIUM: 9 mg/dL (ref 8.9–10.3)
CO2: 25 mmol/L (ref 22–32)
Chloride: 104 mmol/L (ref 101–111)
Creatinine, Ser: 0.36 mg/dL — ABNORMAL LOW (ref 0.61–1.24)
Glucose, Bld: 128 mg/dL — ABNORMAL HIGH (ref 65–99)
POTASSIUM: 4.3 mmol/L (ref 3.5–5.1)
Sodium: 140 mmol/L (ref 135–145)

## 2015-07-05 LAB — CBC
HEMATOCRIT: 34.6 % — AB (ref 39.0–52.0)
Hemoglobin: 11.1 g/dL — ABNORMAL LOW (ref 13.0–17.0)
MCH: 28.9 pg (ref 26.0–34.0)
MCHC: 32.1 g/dL (ref 30.0–36.0)
MCV: 90.1 fL (ref 78.0–100.0)
PLATELETS: 180 10*3/uL (ref 150–400)
RBC: 3.84 MIL/uL — AB (ref 4.22–5.81)
RDW: 12.9 % (ref 11.5–15.5)
WBC: 11.3 10*3/uL — AB (ref 4.0–10.5)

## 2015-07-05 LAB — INFLUENZA PANEL BY PCR (TYPE A & B)
H1N1FLUPCR: NOT DETECTED
INFLBPCR: NEGATIVE
Influenza A By PCR: NEGATIVE

## 2015-07-05 LAB — LACTIC ACID, PLASMA
LACTIC ACID, VENOUS: 1.6 mmol/L (ref 0.5–2.0)
Lactic Acid, Venous: 1.2 mmol/L (ref 0.5–2.0)

## 2015-07-05 MED ORDER — DEXTROSE 5 % IV SOLN
1.0000 g | Freq: Three times a day (TID) | INTRAVENOUS | Status: DC
  Administered 2015-07-05 – 2015-07-08 (×10): 1 g via INTRAVENOUS
  Filled 2015-07-05 (×13): qty 1

## 2015-07-05 MED ORDER — VANCOMYCIN HCL IN DEXTROSE 1-5 GM/200ML-% IV SOLN
1000.0000 mg | INTRAVENOUS | Status: DC
  Administered 2015-07-05 – 2015-07-07 (×3): 1000 mg via INTRAVENOUS
  Filled 2015-07-05 (×4): qty 200

## 2015-07-05 MED ORDER — FLUCONAZOLE 100 MG PO TABS
100.0000 mg | ORAL_TABLET | Freq: Every day | ORAL | Status: DC
  Administered 2015-07-05 – 2015-07-07 (×3): 100 mg via ORAL
  Filled 2015-07-05 (×3): qty 1

## 2015-07-05 MED ORDER — SODIUM CHLORIDE 0.9 % IV BOLUS (SEPSIS)
500.0000 mL | Freq: Once | INTRAVENOUS | Status: AC
  Administered 2015-07-05: 500 mL via INTRAVENOUS

## 2015-07-05 MED ORDER — DEXTROSE 5 % IV SOLN: 1.0000 g | INTRAVENOUS | Status: DC

## 2015-07-05 NOTE — Progress Notes (Signed)
[facility] PROGRESS NOTE  Eric Cameron ZOX:[id number] DOB: 06-09-1934 DOA: 07/01/2015 PCP: Glenetta Borg [name], NP  Assessment/Plan: Eric Cameron is a 81 y.o. male has a past medical history of Diabetes mellitus without complication (HCC); Hypertension; and Alzheimer disease. Was found in his bed unresponsive last time was seen normal last night. At his baseline he is able to walk on his own most of the time can dress himself with some minimal help his sister helps him bathe. He has history of dementia at baseline and usually confused. This morning he was responsive no fever no chest pain no other complaints. Patient has been in the fetal position ever since he was found. EMS was called he was brought then to emergency department.  1-Acute encephalopathy: Unclear etiology. Work up in process. MRI negative, EEG negative for seizure.  Neurology consulted for evaluation and recommendation for anti seizure medications.  IV fluids.  Infectious work up: Chest x ray: Bibasilar atelectasis. No leukocytosis.  TSH low: free T 3 low at 1.7 and free T4 normal. Will start low dose synthroid.  ammonia level 28  Continue with Thiamine and folate preventive.  UA with 6-30 WBC, check urine culture. Started IV antibiotics.  Less interactive today   2-Fever, leukocytosis: PNA Start IV antibiotics, treating for PNA. IV bolus.  Chest x ray with bilateral infiltrates.  Lactic acid Normal.  UA growing yeast and gram positive cocci.   -HTN; resume medications when BP allows it and when able to tolerates oral.   3-Mild elevated troponin. Subsequent troponin negative. ECHO normal EF. EKG: with prolong Qt.  4-Prolong Qt; mg level normal.   5-History of CVA; aspirin  Resume statins when tolerating oral.   6-Hypothyroidism; will start low dose synthroid, to see if this would help with encephalopathy, dementia.   7-Hypokalemia, hypernatremia; resolved.   8-DM; SSI. Hold oral hypoglycemic agent.    9-History of seizure; continue with vimpat.   Code Status: DNR Family Communication: none at bedside.  Disposition Plan; need SNF, await urine culture.    Consultants:  Neurology   Procedures:  ECHO;  EEG    Antibiotics: None  HPI/Subjective: Patient is lying in bed, more lethargic today, confuse.   Objective: Filed Vitals:   07/04/15 2316 07/05/15 0459  BP:  99/51  Pulse:  78  Temp: 100.4 F (38 C) 100.3 F (37.9 C)  Resp:  18    Intake/Output Summary (Last 24 hours) at 07/05/15 1630 Last data filed at 07/05/15 1138  Gross per 24 hour  Intake 16943.84 ml  Output      0 ml  Net 16943.84 ml   Filed Weights   07/01/15 1447 07/01/15 2156 07/03/15 1743  Weight: 52.164 kg (115 lb) 51.8 kg (114 lb 3.2 oz) 51.8 kg (114 lb 3.2 oz)    Exam:   General:  NAD  Cardiovascular: S 1, S 2 RRR  Respiratory: Crackles bases  Abdomen: BS present, soft, nt  Musculoskeletal: no edema  Data Reviewed: Basic Metabolic Panel:  Recent Labs Lab 07/02/15 0405 07/02/15 1122 07/03/15 0731 07/03/15 1229 07/04/15 0830 07/05/15 0716  NA 140  --  146* 144 137 140  K 3.8  --  2.9* 3.2* 3.3* 4.3  CL 106  --  114* 111 106 104  CO2 24  --  GLUCOSE 87  --  89 131* 109* 128*  BUN 15  --  CREATININE 0.53*  --  0.39* 0.51* 0.35* 0.36*  CALCIUM 8.7*  --  8.6* 8.6* 8.4* 9.0  MG 1.8 1.9  --   --   --   --   PHOS 2.8  --   --   --   --   --    Liver Function Tests:  Recent Labs Lab 07/01/15 1531 07/02/15 0405  AST 28 31  ALT 21 16*  ALKPHOS 61 45  BILITOT 0.8 0.8  PROT 8.2* 6.6  ALBUMIN 3.9 3.1*   No results for input(s): LIPASE, AMYLASE in the last 168 hours.  Recent Labs Lab 07/02/15 0843  AMMONIA 28   CBC:  Recent Labs Lab 07/01/15 1531 07/02/15 0405 07/03/15 0731 07/03/15 1229 07/05/15 0716  WBC 11.4* 8.7 7.5 8.9 11.3*  HGB 14.0 11.4* 10.5* 11.1* 11.1*  HCT 43.7 36.1* 31.9* 33.7* 34.6*  MCV 92.6 92.3 91.4 92.1 90.1   PLT 235 212 202 189 180   Cardiac Enzymes:  Recent Labs Lab 07/02/15 1122 07/02/15 1835 07/03/15 0015 07/03/15 0731 07/03/15 1229  TROPONINI <0.03 0.03 <0.03 <0.03 <0.03   BNP (last 3 results) No results for input(s): BNP in the last 8760 hours.  ProBNP (last 3 results) No results for input(s): PROBNP in the last 8760 hours.  CBG:  Recent Labs Lab 07/04/15 2119 07/05/15 0101 07/05/15 0437 07/05/15 0807 07/05/15 1315  GLUCAP 173* 103* 122* 121* 78    Recent Results (from the past 240 hour(s))  MRSA PCR Screening     Status: None   Collection Time: 07/01/15 10:09 PM  Result Value Ref Range Status   MRSA by PCR NEGATIVE NEGATIVE Final    Comment:        The GeneXpert MRSA Assay (FDA approved for NASAL specimens only), is one component of a comprehensive MRSA colonization surveillance program. It is not intended to diagnose MRSA infection nor to guide or monitor treatment for MRSA infections.   Urine culture     Status: None (Preliminary result)   Collection Time: 07/04/15 10:23 AM  Result Value Ref Range Status   Specimen Description URINE, CLEAN CATCH  Final   Special Requests NONE  Final   Culture   Final    >=100,000 COLONIES/mL GRAM POSITIVE COCCI >=100,000 COLONIES/mL YEAST    Report Status PENDING  Incomplete  Culture, blood (routine x 2)     Status: None (Preliminary result)   Collection Time: 07/04/15 10:00 PM  Result Value Ref Range Status   Specimen Description BLOOD RIGHT ARM  Final   Special Requests   Final    BOTTLES DRAWN AEROBIC AND ANAEROBIC 7CC BLUE,10CC RED   Culture NO GROWTH < 24 HOURS  Final   Report Status PENDING  Incomplete  Culture, blood (routine x 2)     Status: None (Preliminary result)   Collection Time: 07/04/15 10:15 PM  Result Value Ref Range Status   Specimen Description BLOOD LEFT ARM  Final   Special Requests   Final    BOTTLES DRAWN AEROBIC AND ANAEROBIC 7CC BLUE,10CC RED   Culture NO GROWTH < 24 HOURS  Final    Report Status PENDING  Incomplete     Studies: Dg Chest Port 1 View  07/05/2015  CLINICAL DATA:  Acute onset of fever.  Initial encounter. EXAM: PORTABLE CHEST 1 VIEW COMPARISON:  Chest radiograph performed 07/01/2015 FINDINGS: Mild bibasilar airspace opacities raise question for mild pneumonia. No pleural effusion or pneumothorax is seen. The cardiomediastinal silhouette is normal in size. No acute osseous abnormalities are seen. IMPRESSION: Mild bibasilar airspace  opacities raise question for mild pneumonia. Electronically Signed   By: Roanna Raider M.D.   On: 07/05/2015 00:45    Scheduled Meds: . aspirin  300 mg Rectal Daily   Or  . aspirin  325 mg Oral Daily  . atorvastatin  10 mg Oral q1800  . ceFEPime (MAXIPIME) IV  1 g Intravenous 3 times per day  . citalopram  10 mg Oral Daily  . folic acid  1 mg Oral Daily  . Influenza vac split quadrivalent PF  0.5 mL Intramuscular Tomorrow-1000  . insulin aspart  0-9 Units Subcutaneous 6 times per day  . lacosamide  50 mg Oral BID  . levothyroxine  25 mcg Oral QAC breakfast  . memantine  10 mg Oral BID  . sodium chloride flush  3 mL Intravenous Q12H  . thiamine  100 mg Oral Daily  . vancomycin  1,000 mg Intravenous Q24H   Continuous Infusions: . sodium chloride 75 mL/hr at 07/04/15 1406    Active Problems:   Acute encephalopathy   HTN (hypertension)   Diabetes mellitus type 2, uncontrolled (HCC)   Dementia   Seizures (HCC)   Elevated troponin   Dehydration    Time spent: 35 minutes.     Hartley Barefoot A  [facility] Pager [phone]. If 7PM-7AM, please contact night-coverage at [email or web address], password [facility] 07/05/2015, 4:30 PM  LOS: 3 days

## 2015-07-05 NOTE — Progress Notes (Addendum)
Pharmacy Antibiotic Note  Eric Cameron is a 81 y.o. male admitted on 07/01/2015 with pneumonia (HCAP).  Pharmacy has been consulted for Cefepime dosing.  Plan: Cefepime 1g IV every 8 hours  Monitor renal function and adjust dose as needed Monitor culture results for ability to narrow therapy Consider sputum culture If patient condition worsens, consider MRSA coverage  Height: 5\' 9"  (175.3 cm) Weight: 114 lb 3.2 oz (51.8 kg) IBW/kg (Calculated) : 70.7  Temp (24hrs), Avg:101.3 F (38.5 C), Min:100.3 F (37.9 C), Max:103.1 F (39.5 C)   Recent Labs Lab 07/01/15 1531 07/01/15 2014 07/02/15 0405 07/03/15 0731 07/03/15 1229 07/04/15 0830  WBC 11.4*  --  8.7 7.5 8.9  --   CREATININE 0.63  --  0.53* 0.39* 0.51* 0.35*  LATICACIDVEN  --  1.59  --   --   --   --     Estimated Creatinine Clearance: 55.8 mL/min (by C-G formula based on Cr of 0.35).    No Known Allergies  Antimicrobials this admission: 3/19 Cefepime >>  Dose adjustments this admission: none  Microbiology results: 3/18 BCx >> 3/18 UCx >> 3/15 MRSA PCR: negative  Thank you for allowing pharmacy to be a part of this patient's care.  Link SnufferJessica Ula Couvillon, PharmD, BCPS Clinical Pharmacist [phone]  07/05/2015 7:08 AM  Addendum: MD added vancomycin.   Plan: Vancomycin 1g IV every 24 hours.   Monitor renal function and culture results.   Link SnufferJessica Marquay Kruse, PharmD, BCPS Clinical Pharmacist [phone] 07/05/2015, 12:37 PM

## 2015-07-05 NOTE — Care Management Important Message (Signed)
Important Message  Patient Details  Name: Cornelia CopaRobert Quesenberry MRN: [id number] Date of Birth: Mar 31, 1935   Medicare Important Message Given:  Yes    Antony HasteBennett, Elvis Boot Harris, RN 07/05/2015, 10:49 AM

## 2015-07-06 DIAGNOSIS — J189 Pneumonia, unspecified organism: Secondary | ICD-10-CM

## 2015-07-06 DIAGNOSIS — L89209 Pressure ulcer of unspecified hip, unspecified stage: Secondary | ICD-10-CM | POA: Insufficient documentation

## 2015-07-06 LAB — CBC
HEMATOCRIT: 34.5 % — AB (ref 39.0–52.0)
HEMOGLOBIN: 11.2 g/dL — AB (ref 13.0–17.0)
MCH: 29.6 pg (ref 26.0–34.0)
MCHC: 32.5 g/dL (ref 30.0–36.0)
MCV: 91.3 fL (ref 78.0–100.0)
Platelets: 179 10*3/uL (ref 150–400)
RBC: 3.78 MIL/uL — AB (ref 4.22–5.81)
RDW: 13 % (ref 11.5–15.5)
WBC: 13.4 10*3/uL — ABNORMAL HIGH (ref 4.0–10.5)

## 2015-07-06 LAB — BASIC METABOLIC PANEL
ANION GAP: 13 (ref 5–15)
BUN: 11 mg/dL (ref 6–20)
CHLORIDE: 101 mmol/L (ref 101–111)
CO2: 21 mmol/L — AB (ref 22–32)
Calcium: 8.4 mg/dL — ABNORMAL LOW (ref 8.9–10.3)
Creatinine, Ser: 0.51 mg/dL — ABNORMAL LOW (ref 0.61–1.24)
GFR calc non Af Amer: >60 mL/min (ref >60)
Glucose, Bld: 192 mg/dL — ABNORMAL HIGH (ref 65–99)
POTASSIUM: 4.2 mmol/L (ref 3.5–5.1)
SODIUM: 135 mmol/L (ref 135–145)

## 2015-07-06 LAB — GLUCOSE, CAPILLARY
GLUCOSE-CAPILLARY: 174 mg/dL — AB (ref 65–99)
GLUCOSE-CAPILLARY: 155 mg/dL — AB (ref 65–99)
Glucose-Capillary: 126 mg/dL — ABNORMAL HIGH (ref 65–99)
Glucose-Capillary: 133 mg/dL — ABNORMAL HIGH (ref 65–99)
Glucose-Capillary: 173 mg/dL — ABNORMAL HIGH (ref 65–99)
Glucose-Capillary: 254 mg/dL — ABNORMAL HIGH (ref 65–99)

## 2015-07-06 LAB — URINE CULTURE: Culture: >=100000

## 2015-07-06 LAB — STREP PNEUMONIAE URINARY ANTIGEN: STREP PNEUMO URINARY ANTIGEN: NEGATIVE

## 2015-07-06 MED ORDER — SODIUM CHLORIDE 0.9 % IV BOLUS (SEPSIS)
500.0000 mL | Freq: Once | INTRAVENOUS | Status: AC
  Administered 2015-07-06: 500 mL via INTRAVENOUS

## 2015-07-06 NOTE — Progress Notes (Signed)
[facility] PROGRESS NOTE  Eric Cameron GNF:[id number] DOB: 02/20/1935 DOA: 07/01/2015 PCP: Glenetta Borg [name], NP  Assessment/Plan: Eric Cameron is a 81 y.o. male has a past medical history of Diabetes mellitus without complication (HCC); Hypertension; and Alzheimer disease. Was found in his bed unresponsive last time was seen normal last night. At his baseline he is able to walk on his own most of the time can dress himself with some minimal help his sister helps him bathe. He has history of dementia at baseline and usually confused. This morning he was responsive no fever no chest pain no other complaints. Patient has been in the fetal position ever since he was found. EMS was called he was brought then to emergency department.  1-Acute encephalopathy: Unclear etiology. Work up in process. MRI negative, EEG negative for seizure.  Neurology consulted for evaluation and recommendation for anti seizure medications.  IV fluids.  Infectious work up: Chest x ray: Bibasilar atelectasis. No leukocytosis.  TSH low: free T 3 low at 1.7 and free T4 normal. Will start low dose synthroid.  ammonia level 28  Continue with Thiamine and folate preventive.  UA with 6-30 WBC, check urine culture. Started IV antibiotics.  Patient became less interactive 3-19, he spike fever at that day also. Treating for infection, PNA  2-Fever, leukocytosis: PNA treating for health care associated.  Continue with IV antibiotics, treating for PNA. Day 2.  Chest x ray with bilateral infiltrates.  Lactic acid Normal.  UA growing yeast and gram positive cocci.  Started diflucan for yeast in urine. .  Blood culture no growth to date.   -HTN; resume medications when BP allows it and when able to tolerates oral.   3-Mild elevated troponin. Subsequent troponin negative. ECHO normal EF. EKG: with prolong Qt.  4-Prolong Qt; mg level normal.   5-History of CVA; aspirin  Resume statins when tolerating oral.    6-Hypothyroidism; will start low dose synthroid, to see if this would help with encephalopathy, dementia.   7-Hypokalemia, hypernatremia; resolved.   8-DM; SSI. Hold oral hypoglycemic agent.   9-History of seizure; continue with vimpat.   Code Status: DNR Family Communication: none at bedside.  Disposition Plan; need SNF, await urine culture.    Consultants:  Neurology   Procedures:  ECHO;  EEG    Antibiotics: None  HPI/Subjective: Less lethargic than yesterday, but still keep eyes close, answer some questions.   Objective: Filed Vitals:   07/06/15 0600 07/06/15 1356  BP: 110/75 94/55  Pulse:  77  Temp:  98.3 F (36.8 C)  Resp:  15    Intake/Output Summary (Last 24 hours) at 07/06/15 1451 Last data filed at 07/06/15 1400  Gross per 24 hour  Intake   1690 ml  Output      0 ml  Net   1690 ml   Filed Weights   07/01/15 1447 07/01/15 2156 07/03/15 1743  Weight: 52.164 kg (115 lb) 51.8 kg (114 lb 3.2 oz) 51.8 kg (114 lb 3.2 oz)    Exam:   General:  NAD  Cardiovascular: S 1, S 2 RRR  Respiratory: Crackles bases  Abdomen: BS present, soft, nt  Musculoskeletal: no edema  Data Reviewed: Basic Metabolic Panel:  Recent Labs Lab 07/02/15 0405 07/02/15 1122 07/03/15 0731 07/03/15 1229 07/04/15 0830 07/05/15 0716 07/06/15 1117  NA 140  --  146* 144 137 140 135  K 3.8  --  2.9* 3.2* 3.3* 4.3 4.2  CL 106  --  114* 111  106 104 101  CO2 24  --  21*  GLUCOSE 87  --  89 131* 109* 128* 192*  BUN 15  --  CREATININE 0.53*  --  0.39* 0.51* 0.35* 0.36* 0.51*  CALCIUM 8.7*  --  8.6* 8.6* 8.4* 9.0 8.4*  MG 1.8 1.9  --   --   --   --   --   PHOS 2.8  --   --   --   --   --   --    Liver Function Tests:  Recent Labs Lab 07/01/15 1531 07/02/15 0405  AST 28 31  ALT 21 16*  ALKPHOS 61 45  BILITOT 0.8 0.8  PROT 8.2* 6.6  ALBUMIN 3.9 3.1*   No results for input(s): LIPASE, AMYLASE in the last 168 hours.  Recent  Labs Lab 07/02/15 0843  AMMONIA 28   CBC:  Recent Labs Lab 07/02/15 0405 07/03/15 0731 07/03/15 1229 07/05/15 0716 07/06/15 1117  WBC 8.7 7.5 8.9 11.3* 13.4*  HGB 11.4* 10.5* 11.1* 11.1* 11.2*  HCT 36.1* 31.9* 33.7* 34.6* 34.5*  MCV 92.3 91.4 92.1 90.1 91.3  PLT 212 202 189 180 179   Cardiac Enzymes:  Recent Labs Lab 07/02/15 1122 07/02/15 1835 07/03/15 0015 07/03/15 0731 07/03/15 1229  TROPONINI <0.03 0.03 <0.03 <0.03 <0.03   BNP (last 3 results) No results for input(s): BNP in the last 8760 hours.  ProBNP (last 3 results) No results for input(s): PROBNP in the last 8760 hours.  CBG:  Recent Labs Lab 07/05/15 2205 07/06/15 0100 07/06/15 0542 07/06/15 0747 07/06/15 1140  GLUCAP 206* 126* 133* 173* 174*    Recent Results (from the past 240 hour(s))  MRSA PCR Screening     Status: None   Collection Time: 07/01/15 10:09 PM  Result Value Ref Range Status   MRSA by PCR NEGATIVE NEGATIVE Final    Comment:        The GeneXpert MRSA Assay (FDA approved for NASAL specimens only), is one component of a comprehensive MRSA colonization surveillance program. It is not intended to diagnose MRSA infection nor to guide or monitor treatment for MRSA infections.   Urine culture     Status: None (Preliminary result)   Collection Time: 07/04/15 10:23 AM  Result Value Ref Range Status   Specimen Description URINE, CLEAN CATCH  Final   Special Requests NONE  Final   Culture   Final    >=100,000 COLONIES/mL GRAM POSITIVE COCCI >=100,000 COLONIES/mL YEAST    Report Status PENDING  Incomplete  Culture, blood (routine x 2)     Status: None (Preliminary result)   Collection Time: 07/04/15 10:00 PM  Result Value Ref Range Status   Specimen Description BLOOD RIGHT ARM  Final   Special Requests   Final    BOTTLES DRAWN AEROBIC AND ANAEROBIC 7CC BLUE,10CC RED   Culture NO GROWTH < 24 HOURS  Final   Report Status PENDING  Incomplete  Culture, blood (routine x 2)      Status: None (Preliminary result)   Collection Time: 07/04/15 10:15 PM  Result Value Ref Range Status   Specimen Description BLOOD LEFT ARM  Final   Special Requests   Final    BOTTLES DRAWN AEROBIC AND ANAEROBIC 7CC BLUE,10CC RED   Culture NO GROWTH < 24 HOURS  Final   Report Status PENDING  Incomplete     Studies: Dg Chest Port 1 View  07/05/2015  CLINICAL DATA:  Acute onset of fever.  Initial encounter. EXAM: PORTABLE CHEST 1 VIEW COMPARISON:  Chest radiograph performed 07/01/2015 FINDINGS: Mild bibasilar airspace opacities raise question for mild pneumonia. No pleural effusion or pneumothorax is seen. The cardiomediastinal silhouette is normal in size. No acute osseous abnormalities are seen. IMPRESSION: Mild bibasilar airspace opacities raise question for mild pneumonia. Electronically Signed   By: Roanna RaiderJeffery  Chang M.D.   On: 07/05/2015 00:45    Scheduled Meds: . aspirin  300 mg Rectal Daily   Or  . aspirin  325 mg Oral Daily  . atorvastatin  10 mg Oral q1800  . ceFEPime (MAXIPIME) IV  1 g Intravenous 3 times per day  . citalopram  10 mg Oral Daily  . fluconazole  100 mg Oral Daily  . folic acid  1 mg Oral Daily  . Influenza vac split quadrivalent PF  0.5 mL Intramuscular Tomorrow-1000  . insulin aspart  0-9 Units Subcutaneous 6 times per day  . lacosamide  50 mg Oral BID  . levothyroxine  25 mcg Oral QAC breakfast  . memantine  10 mg Oral BID  . sodium chloride flush  3 mL Intravenous Q12H  . thiamine  100 mg Oral Daily  . vancomycin  1,000 mg Intravenous Q24H   Continuous Infusions: . sodium chloride 75 mL/hr at 07/06/15 1030    Active Problems:   Acute encephalopathy   HTN (hypertension)   Diabetes mellitus type 2, uncontrolled (HCC)   Dementia   Seizures (HCC)   Elevated troponin   Dehydration   Pressure ulcer    Time spent: 35 minutes.     Hartley Barefootegalado, Belkys A  [facility] Pager [phone]. If 7PM-7AM, please contact night-coverage at [email or web address],  password [email or web address] 07/06/2015, 2:51 PM  LOS: 4 days

## 2015-07-06 NOTE — Clinical Social Work Note (Signed)
Clinical Social Work Assessment  Patient Details  Name: Eric Cameron MRN: [id number] Date of Birth: Jan 10, 1935  Date of referral:  07/06/15               Reason for consult:  Discharge Planning, Facility Placement                Permission sought to share information with:  Facility Sport and exercise psychologist, Family Supports Permission granted to share information::  No (Patient unable to give consent at this time, CSW spoke with patient's sister.)  Name::     Leisure centre manager::  SNFs  Relationship::     Contact Information:     Housing/Transportation Living arrangements for the past 2 months:  [place] of Information:  Other (Comment Required) Eric Cameron, patient's sister.) Patient Interpreter Needed:  None Criminal Activity/Legal Involvement Pertinent to Current Situation/Hospitalization:  No - Comment as needed Significant Relationships:  Siblings Lives with:  Siblings Do you feel safe going back to the place where you live?  Yes Need for family participation in patient care:  Yes (Comment)  Care giving concerns:  Patient's sister would like to try to take the patient home but needs to know if she will be able to manage him. If she is unable to care for him at home, she is open to SNF.   Social Worker assessment / plan:  CSW met with patient and patient's sister at bedside to complete assessment. The patient is from home with his sister and brother. Per sister Eric Cameron, the family would like the patient to return home at discharge if able as he will have 24 hour supervision and assistance. The sister is agreeable to exploring SNF options as alternative to home in the event the patient is unable to return home at discharge. CSW explained SNF search/placement process and answered the patient's questions.  Employment status:  Disabled (Comment on whether or not currently receiving Disability), Retired Forensic scientist:  Information systems manager, Medicaid In [place] PT Recommendations:   [place] / Referral to community resources:  [place]  Patient/Family's Response to care:  The patient's family is happy with the care the patient is receiving.  Patient/Family's Understanding of and Emotional Response to Diagnosis, Current Treatment, and Prognosis:  The patient's family has fair understanding of reason for admission and post DC needs.  Emotional Assessment Appearance:  Appears stated age Attitude/Demeanor/Rapport:  Unable to Assess Affect (typically observed):  Unable to Assess Orientation:  Oriented to Self Alcohol / Substance use:  Not Applicable Psych involvement (Current and /or in the community):  No (Comment)  Discharge Needs  Concerns to be addressed:  Discharge Planning Concerns Readmission within the last 30 days:  No Current discharge risk:  Cognitively Impaired, Chronically ill, Physical Impairment Barriers to Discharge:  Continued Medical Work up   Rigoberto Noel, LCSW 07/06/2015, 4:38 PM

## 2015-07-06 NOTE — Progress Notes (Signed)
Occupational Therapy Treatment Patient Details Name: Eric Cameron MRN: [id number] DOB: 08/26/1934 Today's Date: 07/06/2015    History of present illness Eric Cameron is a 81 y.o. male has a past medical history of Diabetes mellitus without complication (HCC); Hypertension; and Alzheimer disease. Was found in his bed unresponsive last time was seen normal last night. At his baseline he is able to walk on his own most of the time can dress himself with some minimal help his sister helps him bathe. He has history of dementia at baseline and usually confused. This morning he was responsive no fever no chest pain no other complaints. Patient has been in the fetal position ever since he was found.    OT comments  This 81 yo male admitted with above presents to acute OT with making progress towards goals. Will continue to benefit from acute OT with follow up OT at SNF.  Follow Up Recommendations  SNF    Equipment Recommendations   (TBD at next venue)       Precautions / Restrictions Precautions Precautions: Fall Restrictions Weight Bearing Restrictions: No       Mobility Bed Mobility Overal bed mobility: Needs Assistance Bed Mobility: Supine to Sit;Sit to Supine   Sidelying to sit: Supervision Supine to sit: Supervision        Transfers Overall transfer level: Needs assistance Equipment used: 1 person hand held assist Transfers: Sit to/from Stand Sit to Stand: Min assist              Balance Overall balance assessment: Needs assistance Sitting-balance support: No upper extremity supported;Feet supported Sitting balance-Leahy Scale: Good     Standing balance support: No upper extremity supported Standing balance-Leahy Scale: Fair Standing balance comment: able to stand at sink for 7 minutes while I A'd him with cleaning up and he A'd with cleaning himself                   ADL Overall ADL's : Needs assistance/impaired     Grooming: Wash/dry hands;Minimal  assistance;Sitting                   Toilet Transfer: Minimal assistance;Ambulation;Comfort height toilet;Grab bars   Toileting- Clothing Manipulation and Hygiene: Maximal assistance (minguard A standing, needed increased A due to pt was incontient of stool when I entered room)                          Cognition   Behavior During Therapy: Flat affect Overall Cognitive Status: History of cognitive impairments - at baseline                                    Pertinent Vitals/ Pain       Pain Assessment: No/denies pain         Frequency Min 2X/week     Progress Toward Goals  OT Goals(current goals can now be found in the care plan section)  Progress towards OT goals: Progressing toward goals     Plan Discharge plan remains appropriate       End of Session Equipment Utilized During Treatment: Gait belt   Activity Tolerance Patient tolerated treatment well   Patient Left in bed;with call bell/phone within reach;with bed alarm set   Nurse Communication  (RN: I cleaned up pt from bowel movement in bed, but trash and laundry needed to be taken from  room )        Time: 5409-8119 OT Time Calculation (min): 26 min  Charges: OT Treatments $Self Care/Home Management : 23-37 mins  Evette Georges [phone] 07/06/2015, 4:51 PM

## 2015-07-07 ENCOUNTER — Encounter (HOSPITAL_COMMUNITY): Payer: Self-pay | Admitting: General Practice

## 2015-07-07 DIAGNOSIS — E860 Dehydration: Secondary | ICD-10-CM | POA: Diagnosis not present

## 2015-07-07 LAB — GLUCOSE, CAPILLARY
GLUCOSE-CAPILLARY: 143 mg/dL — AB (ref 65–99)
GLUCOSE-CAPILLARY: 160 mg/dL — AB (ref 65–99)
GLUCOSE-CAPILLARY: 222 mg/dL — AB (ref 65–99)
GLUCOSE-CAPILLARY: 225 mg/dL — AB (ref 65–99)
GLUCOSE-CAPILLARY: 232 mg/dL — AB (ref 65–99)
Glucose-Capillary: 160 mg/dL — ABNORMAL HIGH (ref 65–99)
Glucose-Capillary: 216 mg/dL — ABNORMAL HIGH (ref 65–99)

## 2015-07-07 LAB — BASIC METABOLIC PANEL
Anion gap: 14 (ref 5–15)
BUN: 12 mg/dL (ref 6–20)
CO2: 21 mmol/L — ABNORMAL LOW (ref 22–32)
Calcium: 8.5 mg/dL — ABNORMAL LOW (ref 8.9–10.3)
Chloride: 104 mmol/L (ref 101–111)
Creatinine, Ser: 0.49 mg/dL — ABNORMAL LOW (ref 0.61–1.24)
GFR calc Af Amer: >60 mL/min (ref >60)
Glucose, Bld: 229 mg/dL — ABNORMAL HIGH (ref 65–99)
POTASSIUM: 4.1 mmol/L (ref 3.5–5.1)
SODIUM: 139 mmol/L (ref 135–145)

## 2015-07-07 LAB — CBC
HCT: 32.2 % — ABNORMAL LOW (ref 39.0–52.0)
HEMOGLOBIN: 10.2 g/dL — AB (ref 13.0–17.0)
MCH: 28.6 pg (ref 26.0–34.0)
MCHC: 31.7 g/dL (ref 30.0–36.0)
MCV: 90.2 fL (ref 78.0–100.0)
PLATELETS: 240 10*3/uL (ref 150–400)
RBC: 3.57 MIL/uL — AB (ref 4.22–5.81)
RDW: 12.9 % (ref 11.5–15.5)
WBC: 10.1 10*3/uL (ref 4.0–10.5)

## 2015-07-07 MED ORDER — SODIUM CHLORIDE 0.9 % IV SOLN
100.0000 mg | INTRAVENOUS | Status: DC
  Administered 2015-07-08 – 2015-07-09 (×2): 100 mg via INTRAVENOUS
  Filled 2015-07-07 (×3): qty 100

## 2015-07-07 MED ORDER — SODIUM CHLORIDE 0.9 % IV SOLN
200.0000 mg | Freq: Once | INTRAVENOUS | Status: AC
  Administered 2015-07-07: 200 mg via INTRAVENOUS
  Filled 2015-07-07: qty 200

## 2015-07-07 MED ORDER — FLUCONAZOLE IN SODIUM CHLORIDE 200-0.9 MG/100ML-% IV SOLN: 200.0000 mg | INTRAVENOUS | Status: DC

## 2015-07-07 MED ORDER — ADULT MULTIVITAMIN W/MINERALS CH
1.0000 | ORAL_TABLET | Freq: Every day | ORAL | Status: DC
  Administered 2015-07-07 – 2015-07-10 (×4): 1 via ORAL
  Filled 2015-07-07 (×4): qty 1

## 2015-07-07 MED ORDER — ENSURE ENLIVE PO LIQD
237.0000 mL | Freq: Two times a day (BID) | ORAL | Status: DC
  Administered 2015-07-08 – 2015-07-10 (×6): 237 mL via ORAL

## 2015-07-07 NOTE — Progress Notes (Addendum)
[facility] PROGRESS NOTE  Eric Cameron WUJ:[id number] DOB: 05-Apr-1935 DOA: 07/01/2015 PCP: Glenetta Borg [name], NP  Assessment/Plan: Eric Cameron is a 81 y.o. male has a past medical history of Diabetes mellitus without complication (HCC); Hypertension; and Alzheimer disease. Was found in his bed unresponsive last time was seen normal last night. At his baseline he is able to walk on his own most of the time can dress himself with some minimal help his sister helps him bathe. He has history of dementia at baseline and usually confused. The morning prior to admission  he was responsive no fever no chest pain no other complaints. Patient has been in the fetal position ever since he was found. EMS was called he was brought then to emergency department.  Presents with AMS. He was evaluated by neurology. Patient early during hospitalization improved. It was thought that he AMS was progression of dementia. But subsequently he spike fever, chest x ray with finding consistent with PNA. Urine culture with 100,000. He has been getting treatment for PNA, UTI. MS improving slowly. He might need SNF.    1-Acute encephalopathy: Unclear etiology. Work up in process. MRI negative, EEG negative for seizure.  Neurology consulted for evaluation and recommendation for anti seizure medications. Neurology thought AMS was secondary to worsening dementia, but subsequently during hospitalization patient spike fever. He has been receiving treatment for PNA, UTI.  IV fluids.  TSH low: free T 3 low at 1.7 and free T4 normal. started low dose synthroid.  ammonia level 28  Continue with Thiamine and folate preventive.  UA with 6-30 WBC, check urine culture. Started IV antibiotics.  Patient became less interactive 3-19, he spike fever at that day also. Treating for infection, PNA More alert today. Might need SNF.   2-Fever, leukocytosis: PNA treating for health care associated. UTI Continue with IV antibiotics,  treating for PNA. Day 3.  Chest x ray with bilateral infiltrates.  Lactic acid Normal.  UA growing yeast and Aerococcus Urinae.  Started diflucan for yeast in urine. .  Blood culture no growth to date.  WBC pending.  -HTN; resume medications when BP allows it and when able to tolerates oral.   3-Mild elevated troponin. Subsequent troponin negative. ECHO normal EF. EKG: with prolong Qt.  4-Prolong Qt; mg level normal.   5-History of CVA; aspirin  Resume statins when tolerating oral.   6-Hypothyroidism; will start low dose synthroid, to see if this would help with encephalopathy, dementia.   7-Hypokalemia, hypernatremia; resolved.   8-DM; SSI. Hold oral hypoglycemic agent.   9-History of seizure; continue with vimpat.  10-right hip pressure ulcer. Stage 2; wound care consulted.  11-Severe protein caloric malnutrition; ensure  Code Status: DNR Family Communication: none at bedside.  Disposition Plan; need SNF, await urine culture.    Consultants:  Neurology   Procedures:  ECHO;  EEG    Antibiotics: None  HPI/Subjective: Lying in bed, open eyes , answer some questions. pleasantly confused/   Objective: Filed Vitals:   07/07/15 0700 07/07/15 1406  BP: 100/66 132/77  Pulse: 87 84  Temp: 98.4 F (36.9 C) 98.7 F (37.1 C)  Resp: 19 18    Intake/Output Summary (Last 24 hours) at 07/07/15 1546 Last data filed at 07/07/15 1508  Gross per 24 hour  Intake 2343.67 ml  Output      0 ml  Net 2343.67 ml   Filed Weights   07/01/15 1447 07/01/15 2156 07/03/15 1743  Weight: 52.164 kg (115 lb) 51.8 kg (  114 lb 3.2 oz) 51.8 kg (114 lb 3.2 oz)    Exam:   General:  NAD  Cardiovascular: S 1, S 2 RRR  Respiratory: Crackles bases  Abdomen: BS present, soft, nt  Musculoskeletal: no edema  Data Reviewed: Basic Metabolic Panel:  Recent Labs Lab 07/02/15 0405 07/02/15 1122 07/03/15 0731 07/03/15 1229 07/04/15 0830 07/05/15 0716 07/06/15 1117  NA 140   --  146* 144 137 140 135  K 3.8  --  2.9* 3.2* 3.3* 4.3 4.2  CL 106  --  114* 111 106 104 101  CO2 24  --  23 25 22 25  21*  GLUCOSE 87  --  89 131* 109* 128* 192*  BUN 15  --  12 11 6 7 11   CREATININE 0.53*  --  0.39* 0.51* 0.35* 0.36* 0.51*  CALCIUM 8.7*  --  8.6* 8.6* 8.4* 9.0 8.4*  MG 1.8 1.9  --   --   --   --   --   PHOS 2.8  --   --   --   --   --   --    Liver Function Tests:  Recent Labs Lab 07/01/15 1531 07/02/15 0405  AST 28 31  ALT 21 16*  ALKPHOS 61 45  BILITOT 0.8 0.8  PROT 8.2* 6.6  ALBUMIN 3.9 3.1*   No results for input(s): LIPASE, AMYLASE in the last 168 hours.  Recent Labs Lab 07/02/15 0843  AMMONIA 28   CBC:  Recent Labs Lab 07/02/15 0405 07/03/15 0731 07/03/15 1229 07/05/15 0716 07/06/15 1117  WBC 8.7 7.5 8.9 11.3* 13.4*  HGB 11.4* 10.5* 11.1* 11.1* 11.2*  HCT 36.1* 31.9* 33.7* 34.6* 34.5*  MCV 92.3 91.4 92.1 90.1 91.3  PLT 212 202 189 180 179   Cardiac Enzymes:  Recent Labs Lab 07/02/15 1122 07/02/15 1835 07/03/15 0015 07/03/15 0731 07/03/15 1229  TROPONINI <0.03 0.03 <0.03 <0.03 <0.03   BNP (last 3 results) No results for input(s): BNP in the last 8760 hours.  ProBNP (last 3 results) No results for input(s): PROBNP in the last 8760 hours.  CBG:  Recent Labs Lab 07/06/15 2103 07/07/15 0120 07/07/15 0406 07/07/15 0842 07/07/15 1234  GLUCAP 254* 160* 160* 143* 216*    Recent Results (from the past 240 hour(s))  MRSA PCR Screening     Status: None   Collection Time: 07/01/15 10:09 PM  Result Value Ref Range Status   MRSA by PCR NEGATIVE NEGATIVE Final    Comment:        The GeneXpert MRSA Assay (FDA approved for NASAL specimens only), is one component of a comprehensive MRSA colonization surveillance program. It is not intended to diagnose MRSA infection nor to guide or monitor treatment for MRSA infections.   Urine culture     Status: None   Collection Time: 07/04/15 10:23 AM  Result Value Ref Range Status    Specimen Description URINE, CLEAN CATCH  Final   Special Requests NONE  Final   Culture   Final    >=100,000 COLONIES/mL AEROCOCCUS URINAE >=100,000 COLONIES/mL YEAST    Report Status 07/06/2015 FINAL  Final  Culture, blood (routine x 2)     Status: None (Preliminary result)   Collection Time: 07/04/15 10:00 PM  Result Value Ref Range Status   Specimen Description BLOOD RIGHT ARM  Final   Special Requests   Final    BOTTLES DRAWN AEROBIC AND ANAEROBIC 7CC BLUE,10CC RED   Culture NO GROWTH 2 DAYS  Final  Report Status PENDING  Incomplete  Culture, blood (routine x 2)     Status: None (Preliminary result)   Collection Time: 07/04/15 10:15 PM  Result Value Ref Range Status   Specimen Description BLOOD LEFT ARM  Final   Special Requests   Final    BOTTLES DRAWN AEROBIC AND ANAEROBIC 7CC BLUE,10CC RED   Culture NO GROWTH 2 DAYS  Final   Report Status PENDING  Incomplete     Studies: No results found.  Scheduled Meds: . aspirin  300 mg Rectal Daily   Or  . aspirin  325 mg Oral Daily  . atorvastatin  10 mg Oral q1800  . ceFEPime (MAXIPIME) IV  1 g Intravenous 3 times per day  . citalopram  10 mg Oral Daily  . fluconazole  100 mg Oral Daily  . folic acid  1 mg Oral Daily  . insulin aspart  0-9 Units Subcutaneous 6 times per day  . lacosamide  50 mg Oral BID  . levothyroxine  25 mcg Oral QAC breakfast  . memantine  10 mg Oral BID  . sodium chloride flush  3 mL Intravenous Q12H  . thiamine  100 mg Oral Daily  . vancomycin  1,000 mg Intravenous Q24H   Continuous Infusions: . sodium chloride 100 mL/hr at 07/07/15 1156    Active Problems:   Acute encephalopathy   HTN (hypertension)   Diabetes mellitus type 2, uncontrolled (HCC)   Dementia   Seizures (HCC)   Elevated troponin   Dehydration   Pressure ulcer   PNA (pneumonia)    Time spent: 35 minutes.     Hartley Barefoot A  [facility] Pager [phone]. If 7PM-7AM, please contact night-coverage at  [email or web address], password [email or web address] 07/07/2015, 3:46 PM  LOS: 5 days

## 2015-07-07 NOTE — NC FL2 (Signed)
[place] MEDICAID FL2 LEVEL OF CARE SCREENING TOOL     IDENTIFICATION  Patient Name: Eric CopaRobert Eagleson Birthdate: 21-Sep-1934 Sex: male Admission Date (Current Location): 07/01/2015  [facility] [place]   Facility and Address:  [facility], [address], [place], [place] [address]      Provider Number: [id number]  Attending Physician Name and Address:  Alba CoryBelkys A Regalado, MD  Relative Name and Phone Number:       Current Level of Care: Hospital Recommended Level of Care: Skilled Nursing Facility Prior Approval Number:    Date Approved/Denied:   PASRR Number: [id number]  Discharge Plan: SNF    Current Diagnoses: Patient Active Problem List   Diagnosis Date Noted  . Pressure ulcer 07/06/2015  . PNA (pneumonia) 07/06/2015  . Elevated troponin 07/01/2015  . Dehydration 07/01/2015  . Seizures (HCC) 12/03/2013  . Unspecified cerebral artery occlusion with cerebral infarction 12/01/2013  . Altered mental status 11/30/2013  . Acute encephalopathy 11/30/2013  . HTN (hypertension) 11/30/2013  . Diabetes mellitus type 2, uncontrolled (HCC) 11/30/2013  . Dementia 11/30/2013  . HLD (hyperlipidemia) 11/30/2013    Orientation RESPIRATION BLADDER Height & Weight     Self  Normal Incontinent Weight: 51.8 kg (114 lb 3.2 oz) Height:  5\' 9"  (175.3 cm)  BEHAVIORAL SYMPTOMS/MOOD NEUROLOGICAL BOWEL NUTRITION STATUS   (NONE)  (NONE) Continent Diet (DYS 3)  AMBULATORY STATUS COMMUNICATION OF NEEDS Skin   Extensive Assist Verbally PU Stage and Appropriate Care                       Personal Care Assistance Level of Assistance  Bathing, Feeding, Dressing Bathing Assistance: Limited assistance Feeding assistance: Independent Dressing Assistance: Limited assistance     Functional Limitations Info  Sight, Hearing, Speech Sight Info: Adequate Hearing Info: Adequate Speech Info: Adequate    SPECIAL CARE FACTORS FREQUENCY  PT (By  licensed PT), Speech therapy, OT (By licensed OT)     PT Frequency: 5/week OT Frequency: 5/week     Speech Therapy Frequency: 5/week      Contractures Contractures Info: Not present    Additional Factors Info  Allergies, Code Status, Psychotropic Code Status Info: DNR Allergies Info: NKDA Psychotropic Info: Celexa and Namenda         Current Medications (07/07/2015):  This is the current hospital active medication list Current Facility-Administered Medications  Medication Dose Route Frequency Provider Last Rate Last Dose  . 0.45 % sodium chloride infusion   Intravenous Continuous Belkys A Regalado, MD 100 mL/hr at 07/07/15 0144    . acetaminophen (TYLENOL) tablet 650 mg  650 mg Oral Q6H PRN Therisa DoyneAnastassia Doutova, MD   650 mg at 07/04/15 2128   Or  . acetaminophen (TYLENOL) suppository 650 mg  650 mg Rectal Q6H PRN Therisa DoyneAnastassia Doutova, MD      . aspirin suppository 300 mg  300 mg Rectal Daily Therisa DoyneAnastassia Doutova, MD       Or  . aspirin tablet 325 mg  325 mg Oral Daily Therisa DoyneAnastassia Doutova, MD   325 mg at 07/07/15 1019  . atorvastatin (LIPITOR) tablet 10 mg  10 mg Oral q1800 Belkys A Regalado, MD   10 mg at 07/06/15 1727  . ceFEPIme (MAXIPIME) 1 g in dextrose 5 % 50 mL IVPB  1 g Intravenous 3 times per day Quenton FetterJessica B Millen, RPH   1 g at 07/07/15 0549  . citalopram (CELEXA) tablet 10 mg  10 mg  Oral Daily Belkys A Regalado, MD   10 mg at 07/07/15 1019  . fluconazole (DIFLUCAN) tablet 100 mg  100 mg Oral Daily Belkys A Regalado, MD   100 mg at 07/07/15 1019  . folic acid (FOLVITE) tablet 1 mg  1 mg Oral Daily Belkys A Regalado, MD   1 mg at 07/07/15 1019  . insulin aspart (novoLOG) injection 0-9 Units  0-9 Units Subcutaneous 6 times per day Therisa Doyne, MD   1 Units at 07/07/15 1018  . lacosamide (VIMPAT) tablet 50 mg  50 mg Oral BID Belkys A Regalado, MD   50 mg at 07/07/15 1019  . levothyroxine (SYNTHROID, LEVOTHROID) tablet 25 mcg  25 mcg Oral QAC breakfast Belkys A Regalado, MD    25 mcg at 07/07/15 0617  . LORazepam (ATIVAN) injection 1 mg  1 mg Intravenous Q6H PRN Belkys A Regalado, MD      . memantine (NAMENDA) tablet 10 mg  10 mg Oral BID Belkys A Regalado, MD   10 mg at 07/07/15 1020  . ondansetron (ZOFRAN) tablet 4 mg  4 mg Oral Q6H PRN Therisa Doyne, MD       Or  . ondansetron (ZOFRAN) injection 4 mg  4 mg Intravenous Q6H PRN Therisa Doyne, MD      . sodium chloride flush (NS) 0.9 % injection 3 mL  3 mL Intravenous Q12H Therisa Doyne, MD   3 mL at 07/05/15 0821  . thiamine (VITAMIN B-1) tablet 100 mg  100 mg Oral Daily Belkys A Regalado, MD   100 mg at 07/07/15 1019  . vancomycin (VANCOCIN) IVPB 1000 mg/200 mL premix  1,000 mg Intravenous Q24H Quenton Fetter, RPH   1,000 mg at 07/06/15 1207     Discharge Medications: Please see discharge summary for a list of discharge medications.  Relevant Imaging Results:  Relevant Lab Results:   Additional Information SSN: [id number]  Venita Lick, LCSW

## 2015-07-07 NOTE — Clinical Documentation Improvement (Signed)
Hospitalist  Can the diagnosis of pressure ulcer be further specified?   Document if pressure ulcer with stage is Present on Admission   Document Site with laterality - Elbow, Back (upper/lower), Sacral, Hip, Buttock, Ankle, Heel, Head, Other (Specify)  Pressure Ulcer Stage - Stage1, Stage 2, Stage 3, Stage 4, Unstageable, Unspecified, Unable to Clinically Determine  Document any associated diagnoses/conditions such as: with gangrene  Other  Clinically Undetermined    Supporting Information: Pressure ulcer noted per 3/20 progress notes.  Stage II pressure ulcer of right hip noted per 3/20 flowsheets.   Please exercise your independent, professional judgment when responding. A specific answer is not anticipated or expected.   Thank Sabino DonovanYou,  Matti Killingsworth [facility] [place] [phone]

## 2015-07-07 NOTE — Care Management Note (Signed)
Case Management Note  Patient Details  Name: Eric CopaRobert Cameron MRN: [id number] Date of Birth: [birth year]  Subjective/Objective:                    Action/Plan:   Expected Discharge Date:                  Expected Discharge Plan:  Skilled Nursing Facility  In-House Referral:  Clinical Social Work  Discharge planning Services     Post Acute Care Choice:    Choice offered to:     DME Arranged:    DME Agency:     HH Arranged:    HH Agency:     Status of Service:  In process, will continue to follow  Medicare Important Message Given:  Yes Date Medicare IM Given:    Medicare IM give by:    Date Additional Medicare IM Given:    Additional Medicare Important Message give by:     If discussed at Long Length of Stay Meetings, dates discussed:  07-07-15  Additional Comments: UR updated  Kingsley PlanWile, Jadore Veals Marie, RN 07/07/2015, 10:12 AM

## 2015-07-07 NOTE — Progress Notes (Signed)
CRITICAL VALUE ALERT  Critical value received:  3/18 Blood cultures: aerobic bottle + yeast with pseudohyphae  Date of notification:  07/07/2015  Time of notification:  1715  Critical value read back:Yes.    Nurse who received alert:  Earna CoderLindsey Chadley Dziedzic, RN  MD notified (1st page):  Regalado  Time of first page:  1725  MD notified (2nd page):  Time of second page:  Responding MD:  Sunnie Nielsenegalado  Time MD responded:  1725

## 2015-07-07 NOTE — Clinical Social Work Note (Signed)
CSW has left SNF bed offers at patient's bed side. Sister not at bedside at this time. CSW attempted to reach patient's sister 3 times by phone, but unable to leave messages. CSW will try to followup with patient's sister tomorrow.  Roddie McBryant Ingra Rother MSW, [place], [place], [phone]

## 2015-07-07 NOTE — Progress Notes (Signed)
PT Cancellation Note  Patient Details Name: Eric CopaRobert Cameron MRN: [id number] DOB: December 20, 1934   Cancelled Treatment:    Reason Eval/Treat Not Completed: Patient declined, no reason specified   Currently refusing OOB or walking;  Will follow up later today as time allows;  Otherwise, will follow up for PT tomorrow;   Thank you,  Van ClinesHolly Waver Dibiasio, PT  Acute Rehabilitation Services Pager [phone] Office [phone]     Van ClinesGarrigan, Meklit Cotta [facility] 07/07/2015, 10:43 AM

## 2015-07-07 NOTE — Progress Notes (Addendum)
Nutrition Follow-up  DOCUMENTATION CODES:   Severe malnutrition in context of chronic illness  INTERVENTION:   -Ensure Enlive po BID, each supplement provides 350 kcal and 20 grams of protein -MVI daily  NUTRITION DIAGNOSIS:   Malnutrition related to chronic illness as evidenced by severe depletion of muscle mass, severe depletion of body fat.  Ongoing  GOAL:   Patient will meet greater than or equal to 90% of their needs  Progressing  MONITOR:   PO intake, I & O's, Labs, Skin  REASON FOR ASSESSMENT:   Low Braden    ASSESSMENT:   Eric Cameron is a 81 yo male has a past medical history of Diabetes mellitus without complication (HCC); Hypertension; and Alzheimer disease. Was found in his bed unresponsive last time was seen normal last night. At his baseline he is able to walk on his own most of the time can dress himself with some minimal help his sister helps him bathe. He has history of dementia at baseline and usually confused.  Pt transferred from SDU to surgical floor on 07/03/15.   Pt underwent EEG on 07/02/15, which revealed acute encephalopathy.   Pt consuming 70-100% of meals on a dysphagia 3 diet.   Noted pt with stage II pressure injury on rt hip; wound care consult pending. Pt with increase nutrient needs for wound healing and due to malnourished state. RD will add supplements to promote wound healing and nutritional adequacy.   CSW following. Plan to d/c to SNF once medically stable.   Labs reviewed.  Diet Order:  DIET DYS 3 Room service appropriate?: Yes; Fluid consistency:: Thin  Skin:  Wound (see comment) (stage II rt hip)  Last BM:  07/07/15  Height:   Ht Readings from Last 1 Encounters:  07/01/15 5\' 9"  (1.753 m)    Weight:   Wt Readings from Last 1 Encounters:  07/03/15 114 lb 3.2 oz (51.8 kg)    Ideal Body Weight:  72.72 kg  BMI:  Body mass index is 16.86 kg/(m^2).  Estimated Nutritional Needs:   Kcal:  1550-1750  Protein:   70-80 grams  Fluid:  1.5-1.7 L  EDUCATION NEEDS:   No education needs identified at this time  Lakota Schweppe A. Mayford KnifeWilliams, RD, LDN, CDE Pager: [phone] After hours Pager: [phone]

## 2015-07-08 DIAGNOSIS — I4581 Long QT syndrome: Secondary | ICD-10-CM

## 2015-07-08 DIAGNOSIS — B3749 Other urogenital candidiasis: Secondary | ICD-10-CM

## 2015-07-08 DIAGNOSIS — F0280 Dementia in other diseases classified elsewhere without behavioral disturbance: Secondary | ICD-10-CM

## 2015-07-08 DIAGNOSIS — J189 Pneumonia, unspecified organism: Secondary | ICD-10-CM

## 2015-07-08 DIAGNOSIS — G309 Alzheimer's disease, unspecified: Secondary | ICD-10-CM

## 2015-07-08 LAB — GLUCOSE, CAPILLARY
GLUCOSE-CAPILLARY: 54 mg/dL — AB (ref 65–99)
GLUCOSE-CAPILLARY: 101 mg/dL — AB (ref 65–99)
GLUCOSE-CAPILLARY: 128 mg/dL — AB (ref 65–99)
Glucose-Capillary: 193 mg/dL — ABNORMAL HIGH (ref 65–99)
Glucose-Capillary: 203 mg/dL — ABNORMAL HIGH (ref 65–99)
Glucose-Capillary: 229 mg/dL — ABNORMAL HIGH (ref 65–99)
Glucose-Capillary: 281 mg/dL — ABNORMAL HIGH (ref 65–99)

## 2015-07-08 LAB — CBC
HCT: 31.1 % — ABNORMAL LOW (ref 39.0–52.0)
Hemoglobin: 10.1 g/dL — ABNORMAL LOW (ref 13.0–17.0)
MCH: 29.1 pg (ref 26.0–34.0)
MCHC: 32.5 g/dL (ref 30.0–36.0)
MCV: 89.6 fL (ref 78.0–100.0)
PLATELETS: 258 10*3/uL (ref 150–400)
RBC: 3.47 MIL/uL — AB (ref 4.22–5.81)
RDW: 12.9 % (ref 11.5–15.5)
WBC: 9.9 10*3/uL (ref 4.0–10.5)

## 2015-07-08 LAB — BASIC METABOLIC PANEL
ANION GAP: 11 (ref 5–15)
BUN: 10 mg/dL (ref 6–20)
CALCIUM: 8.7 mg/dL — AB (ref 8.9–10.3)
CO2: 22 mmol/L (ref 22–32)
Chloride: 106 mmol/L (ref 101–111)
Creatinine, Ser: 0.39 mg/dL — ABNORMAL LOW (ref 0.61–1.24)
Glucose, Bld: 66 mg/dL (ref 65–99)
POTASSIUM: 3.2 mmol/L — AB (ref 3.5–5.1)
Sodium: 139 mmol/L (ref 135–145)

## 2015-07-08 MED ORDER — POTASSIUM CHLORIDE CRYS ER 20 MEQ PO TBCR
40.0000 meq | EXTENDED_RELEASE_TABLET | ORAL | Status: AC
  Administered 2015-07-08 (×2): 40 meq via ORAL
  Filled 2015-07-08 (×2): qty 2

## 2015-07-08 NOTE — Progress Notes (Signed)
[facility] PROGRESS NOTE  Standley Bargo ZOX:[id number] DOB: [birth year] DOA: 07/01/2015 PCP: Glenetta Borg [name], NP  Assessment/Plan: 1-Acute encephalopathy: Unclear etiology. Work up in process. MRI negative, EEG negative for seizure.  Neurology consulted for evaluation and recommendation for anti seizure medications. Neurology thought AMS was secondary to worsening dementia, but subsequently during hospitalization patient spike fever. He has been receiving treatment for PNA, UTI.  IV fluids.  TSH low: free T 3 low at 1.7 and free T4 normal. started low dose synthroid.  ammonia level 28  Continue with Thiamine and folate preventive.  UA with 6-30 WBC, check urine culture. Started IV antibiotics.  Patient became less interactive 3-19, he spike fever at that day also. Started on antibiotics for pneumonia Today seen by ID, normal evidence for pneumonia or UTI. Vancomycin cefepime discontinued. Continue antifungal for yeast and blood   2-Fever, leukocytosis: PNA treating for health care associated. UTI Continue with IV antibiotics, treating for PNA. Day 3.  Chest x ray with bilateral infiltrates.  Lactic acid Normal.  UA growing yeast and Aerococcus Urinae.   As above ID recommends to discontinue vancomycin and cefepime. Continue IV Eraxis  -HTN; resume medications when BP allows it and when able to tolerates oral.   3-Mild elevated troponin. Subsequent troponin negative. ECHO normal EF. EKG: with prolong Qt.  4-Prolong Qt; mg level normal.   5-History of CVA; aspirin Resume statins when tolerating oral.   6-Hypothyroidism; will start low dose synthroid, to see if this would help with encephalopathy, dementia.   7-Hypokalemia- replace potassium and check BMP in a.m.  8-DM; SSI. Hold oral hypoglycemic agent.   9-History of seizure; continue with vimpat.   10-right hip pressure ulcer. Stage 2; wound care consulted.    Code Status: DO NOT RESUSCITATE Family  Communication: No family at bedside Disposition Plan: Skilled nursing facility   Consultants:  Infectious disease  Procedures:  None  Antibiotics:  Vancomycin  Cefepime  Anidulafungin  HPI/Subjective: 81 y.o. male with Alzheimer's dementia, DM, lives at hme with sister who came in 3/15 with unresponsiveness which is not his usual self. Usually confused but alert. No other concerns noted. He was dehydrated on exam and admitted. His initial WBC was 11.4 and MRI done and no acute findings. On 3/19 IV antibiotics were started after fever went upto 103 and felt source was urine.  Patient seen and examined. Denies any complaints this morning   Objective: Filed Vitals:   07/07/15 2130 07/08/15 0520  BP: 127/69 144/80  Pulse: 86 86  Temp: 98 F (36.7 C) 98 F (36.7 C)  Resp: 18 18    Intake/Output Summary (Last 24 hours) at 07/08/15 1505 Last data filed at 07/08/15 1200  Gross per 24 hour  Intake   2757 ml  Output      0 ml  Net   2757 ml   Filed Weights   07/01/15 1447 07/01/15 2156 07/03/15 1743  Weight: 52.164 kg (115 lb) 51.8 kg (114 lb 3.2 oz) 51.8 kg (114 lb 3.2 oz)    Exam:   General:  Appears in no acute distress  Cardiovascular: S1-S2 normal, regular rhythm  Respiratory: Clear to auscultation bilaterally  Abdomen: Soft, nontender, no organomegaly  Musculoskeletal: No cyanosis/clubbing/edema of the lower extremities   Data Reviewed: Basic Metabolic Panel:  Recent Labs Lab 07/02/15 0405 07/02/15 1122  07/04/15 0830 07/05/15 0716 07/06/15 1117 07/07/15 1537 07/08/15 0420  NA 140  --   < > 137 140 135 139 139  K 3.8  --   < > 3.3* 4.3 4.2 4.1 3.2*  CL 106  --   < > 106 104 101 104 106  CO2 24  --   < > 22 25 21* 21* 22  GLUCOSE 87  --   < > 109* 128* 192* 229* 66  BUN 15  --   < > CREATININE 0.53*  --   < > 0.35* 0.36* 0.51* 0.49* 0.39*  CALCIUM 8.7*  --   < > 8.4* 9.0 8.4* 8.5* 8.7*  MG 1.8 1.9  --   --   --   --   --    --   PHOS 2.8  --   --   --   --   --   --   --   < > = values in this interval not displayed. Liver Function Tests:  Recent Labs Lab 07/01/15 1531 07/02/15 0405  AST 28 31  ALT 21 16*  ALKPHOS 61 45  BILITOT 0.8 0.8  PROT 8.2* 6.6  ALBUMIN 3.9 3.1*    Recent Labs Lab 07/02/15 0843  AMMONIA 28   CBC:  Recent Labs Lab 07/03/15 1229 07/05/15 0716 07/06/15 1117 07/07/15 1537 07/08/15 0420  WBC 8.9 11.3* 13.4* 10.1 9.9  HGB 11.1* 11.1* 11.2* 10.2* 10.1*  HCT 33.7* 34.6* 34.5* 32.2* 31.1*  MCV 92.1 90.1 91.3 90.2 89.6  PLT 189 180 179 240 258   Cardiac Enzymes:  Recent Labs Lab 07/02/15 1122 07/02/15 1835 07/03/15 0015 07/03/15 0731 07/03/15 1229  TROPONINI <0.03 0.03 <0.03 <0.03 <0.03    CBG:  Recent Labs Lab 07/07/15 2349 07/08/15 0426 07/08/15 0451 07/08/15 0743 07/08/15 1152  GLUCAP 222* 54* 101* 128* 229*    Recent Results (from the past 240 hour(s))  MRSA PCR Screening     Status: None   Collection Time: 07/01/15 10:09 PM  Result Value Ref Range Status   MRSA by PCR NEGATIVE NEGATIVE Final    Comment:        The GeneXpert MRSA Assay (FDA approved for NASAL specimens only), is one component of a comprehensive MRSA colonization surveillance program. It is not intended to diagnose MRSA infection nor to guide or monitor treatment for MRSA infections.   Urine culture     Status: None   Collection Time: 07/04/15 10:23 AM  Result Value Ref Range Status   Specimen Description URINE, CLEAN CATCH  Final   Special Requests NONE  Final   Culture   Final    >=100,000 COLONIES/mL AEROCOCCUS URINAE >=100,000 COLONIES/mL YEAST    Report Status 07/06/2015 FINAL  Final  Culture, blood (routine x 2)     Status: None (Preliminary result)   Collection Time: 07/04/15 10:00 PM  Result Value Ref Range Status   Specimen Description BLOOD RIGHT ARM  Final   Special Requests   Final    BOTTLES DRAWN AEROBIC AND ANAEROBIC 7CC BLUE,10CC RED   Culture  NO GROWTH 4 DAYS  Final   Report Status PENDING  Incomplete  Culture, blood (routine x 2)     Status: None (Preliminary result)   Collection Time: 07/04/15 10:15 PM  Result Value Ref Range Status   Specimen Description BLOOD LEFT ARM  Final   Special Requests   Final    BOTTLES DRAWN AEROBIC AND ANAEROBIC 7CC BLUE,10CC RED   Culture  Setup Time   Final    YEAST WITH PSEUDOHYPHAE AEROBIC BOTTLE ONLY CRITICAL RESULT  CALLED TO, READ BACK BY AND VERIFIED WITH: L BLACKWELL RN 1713 07/07/15 A BROWNING    Culture YEAST IDENTIFICATION TO FOLLOW   Final   Report Status PENDING  Incomplete     Studies: No results found.  Scheduled Meds: . anidulafungin  100 mg Intravenous Q24H  . aspirin  300 mg Rectal Daily   Or  . aspirin  325 mg Oral Daily  . atorvastatin  10 mg Oral q1800  . citalopram  10 mg Oral Daily  . feeding supplement (ENSURE ENLIVE)  237 mL Oral BID BM  . folic acid  1 mg Oral Daily  . insulin aspart  0-9 Units Subcutaneous 6 times per day  . lacosamide  50 mg Oral BID  . levothyroxine  25 mcg Oral QAC breakfast  . memantine  10 mg Oral BID  . multivitamin with minerals  1 tablet Oral Daily  . sodium chloride flush  3 mL Intravenous Q12H  . thiamine  100 mg Oral Daily   Continuous Infusions: . sodium chloride 100 mL/hr at 07/07/15 1156    Active Problems:   Acute encephalopathy   HTN (hypertension)   Diabetes mellitus type 2, uncontrolled (HCC)   Dementia   Seizures (HCC)   Elevated troponin   Dehydration   Pressure ulcer   PNA (pneumonia)    Time spent: 25 min    [facility]venthealth SebringAMA,Gabryel Files S  [facility] Pager [phone]. If 7PM-7AM, please contact night-coverage at [email or web address], password [email or web address] 07/08/2015, 3:05 PM  LOS: 6 days

## 2015-07-08 NOTE — Consult Note (Signed)
WOC wound consult note Reason for Consult: Consult requested for right hip Wound type: Stage 2 pressure injury Pressure Ulcer POA: Yes Measurement: 1.5X2X.1cm, irregular in shape Wound bed: Pink and dry Drainage (amount, consistency, odor) no odor or drainage Periwound: Intact skin surrounding Dressing procedure/placement/frequency: Foam dressing to protect and promote healing Please re-consult if further assistance is needed.  Thank-you,  Cammie Mcgeeawn Lorielle Boehning MSN, RN, CWOCN, RuffinWCN-AP, CNS [phone]

## 2015-07-08 NOTE — Progress Notes (Signed)
Physical Therapy Treatment Patient Details Name: Eric Cameron Rinella MRN: [id number] DOB: 30-Jun-1934 Today's Date: 07/08/2015    History of Present Illness Eric Cameron Henneke is a 81 y.o. male has a past medical history of Diabetes mellitus without complication (HCC); Hypertension; and Alzheimer disease. Was found in his bed unresponsive last time was seen normal last night. At his baseline he is able to walk on his own most of the time can dress himself with some minimal help his sister helps him bathe. He has history of dementia at baseline and usually confused. This morning he was responsive no fever no chest pain no other complaints. Patient has been in the fetal position ever since he was found.     PT Comments    Patient was soiled upon standing and required time to change undergarment. Patient was able to stand with CGA and SL stance on each side while doffing and donning garments. Progressing slowly with ambulation. Very flat affect.   Follow Up Recommendations  Supervision/Assistance - 24 hour;SNF     Equipment Recommendations       Recommendations for Other Services       Precautions / Restrictions Precautions Precautions: Fall    Mobility  Bed Mobility               General bed mobility comments: Patient up in recliner upon entering room  Transfers Overall transfer level: Needs assistance Equipment used: 1 person hand held assist   Sit to Stand: Min assist         General transfer comment: Did not note posterior lean this session. Stood with good balance. Able to lift one leg at a time to dof and don new under garments  Ambulation/Gait Ambulation/Gait assistance: Min assist Ambulation Distance (Feet): 400 Feet Assistive device: 1 person hand held assist   Gait velocity: slightly decreased Gait velocity interpretation: Below normal speed for age/gender General Gait Details: Unsteady gait noted with increased scissoring at home. Lateral sway in both  directions   Stairs            Wheelchair Mobility    Modified Rankin (Stroke Patients Only)       Balance                                    Cognition Arousal/Alertness: Awake/alert Behavior During Therapy: Flat affect Overall Cognitive Status: History of cognitive impairments - at baseline Area of Impairment: Orientation;Attention;Memory;Following commands;Safety/judgement;Awareness;Problem solving Orientation Level: Disoriented to;Time;Situation;Place Current Attention Level: Sustained   Following Commands: Follows one step commands inconsistently Safety/Judgement: Decreased awareness of safety;Decreased awareness of deficits          Exercises      General Comments        Pertinent Vitals/Pain Pain Assessment: No/denies pain    Home Living                      Prior Function            PT Goals (current goals can now be found in the care plan section)      Frequency  Min 3X/week    PT Plan Current plan remains appropriate    Co-evaluation             End of Session Equipment Utilized During Treatment: Gait belt Activity Tolerance: Patient tolerated treatment well Patient left: in chair;with call bell/phone within reach;with chair alarm set  Time: 1914-7829 PT Time Calculation (min) (ACUTE ONLY): 24 min  Charges:  $Gait Training: 8-22 mins $Therapeutic Activity: 8-22 mins                    G Codes:      Fredrich Birks 07/08/2015, 1:48 PM 07/08/2015 Robinette, Adline Potter PTA

## 2015-07-08 NOTE — Progress Notes (Signed)
Inpatient Diabetes Program Recommendations  AACE/ADA: New Consensus Statement on Inpatient Glycemic Control (2015)  Target Ranges:  Prepandial:   less than 140 mg/dL      Peak postprandial:   less than 180 mg/dL (1-2 hours)      Critically ill patients:  140 - 180 mg/dL   Review of Glycemic Control:  Results for Eric Cameron, Eric Cameron (MRN [id number]) as of 07/08/2015 10:39  Ref. Range 07/07/2015 08:42 07/07/2015 12:34 07/07/2015 17:14 07/07/2015 19:47 07/07/2015 23:49 07/08/2015 04:26 07/08/2015 04:51 07/08/2015 07:43  Glucose-Capillary Latest Ref Range: 65-99 mg/dL 409143 (H) 811216 (H) 914225 (H) 232 (H) 222 (H) 54 (L) 101 (H) 128 (H)    Diabetes history: Type 2 diabetes Outpatient Diabetes medications: Invokamet 50-500 mg bid Current orders for Inpatient glycemic control:  Novolog sensitive q 4 hours  Inpatient Diabetes Program Recommendations:    Please consider changing Novolog correction to tid with meals and HS.  Also may consider adding low dose basal insulin while in the hospital.  Consider Lantus 6 units daily.  Note that patient was on Invokamet 50-500 mg bid prior to admit.  Consider d/c of this medication at discharge due to risk for dehydration and ketoacidosis?  Text page sent to Dr. Sharl MaLama.  Thanks, Beryl MeagerJenny Morrison Masser, RN, BC-ADM Inpatient Diabetes Coordinator Pager [phone] (8a-5p)

## 2015-07-08 NOTE — Clinical Social Work Note (Signed)
CSW attempted to reach patient's sister, [name], regarding SNF placement bed offers. CSW unable to leave voicemail with patient's sister at this time. CSW will continue to follow and assist with discharge planning needs.  Barrier to discharge: CSW unable to reach patient's sister to present SNF bed offers.  Marcelline Deistmily Meir Elwood, LCSW [phone] Orthopedics: [phone] Surgical: [phone]

## 2015-07-08 NOTE — Progress Notes (Addendum)
Pharmacy Antibiotic Note  Cornelia CopaRobert Schader is a 81 y.o. male admitted on 07/01/2015 with HCAP.  Pharmacy has been consulted for dosing vancomycin and cefepime.  Patient is also on Eraxis for yeast in urine and blood cultures.  Renal function has been stable.  Plan: - Vanc 1gm IV Q24H - Cefepime 1gm IV Q8H - Eraxis 100mg  IV Q24H per MD - Monitor renal fxn, clinical progress, vanc trough soon if abx is not yet narrowed - Consider repeating blood cultures   Height: 5\' 9"  (175.3 cm) Weight: 114 lb 3.2 oz (51.8 kg) IBW/kg (Calculated) : 70.7  Temp (24hrs), Avg:98.2 F (36.8 C), Min:98 F (36.7 C), Max:98.7 F (37.1 C)   Recent Labs Lab 07/01/15 2014  07/03/15 1229 07/04/15 0830 07/05/15 0716 07/05/15 1250 07/05/15 1540 07/06/15 1117 07/07/15 1537 07/08/15 0420  WBC  --   < > 8.9  --  11.3*  --   --  13.4* 10.1 9.9  CREATININE  --   < > 0.51* 0.35* 0.36*  --   --  0.51* 0.49* 0.39*  LATICACIDVEN 1.59  --   --   --   --  1.2 1.6  --   --   --   < > = values in this interval not displayed.  Estimated Creatinine Clearance: 55.8 mL/min (by C-G formula based on Cr of 0.39).    No Known Allergies  Antimicrobials this admission: Cefepime 3/19 >> Vanc 3/19 >> Fluc 3/19 >> 3/21 (d/c'ed d/t elevated QTc) Eraxis 3/21 >>  Dose adjustments this admission: N/A  Microbiology results: 3/18 UCx - yeast, Aerococcus (case report says Cipro works) 3/18 BCx x2 - yeast (1 of 2)   Gala Padovano D. Laney Potashang, PharmD, BCPS Pager:  [phone] 07/08/2015, 11:12 AM

## 2015-07-08 NOTE — Care Management Important Message (Signed)
Important Message  Patient Details  Name: Cornelia CopaRobert Rathe MRN: [id number] Date of Birth: 08/27/1934   Medicare Important Message Given:  Yes    Oralia RudMegan P Janina Trafton 07/08/2015, 12:33 PM

## 2015-07-08 NOTE — Consult Note (Addendum)
[facility]       Reason for Consult: yeast in blood culture    Referring Physician: CHAMP autoconsult  Active Problems:   Acute encephalopathy   HTN (hypertension)   Diabetes mellitus type 2, uncontrolled (HCC)   Dementia   Seizures (HCC)   Elevated troponin   Dehydration   Pressure ulcer   PNA (pneumonia)   . anidulafungin  100 mg Intravenous Q24H  . aspirin  300 mg Rectal Daily   Or  . aspirin  325 mg Oral Daily  . atorvastatin  10 mg Oral q1800  . ceFEPime (MAXIPIME) IV  1 g Intravenous 3 times per day  . citalopram  10 mg Oral Daily  . feeding supplement (ENSURE ENLIVE)  237 mL Oral BID BM  . folic acid  1 mg Oral Daily  . insulin aspart  0-9 Units Subcutaneous 6 times per day  . lacosamide  50 mg Oral BID  . levothyroxine  25 mcg Oral QAC breakfast  . memantine  10 mg Oral BID  . multivitamin with minerals  1 tablet Oral Daily  . sodium chloride flush  3 mL Intravenous Q12H  . thiamine  100 mg Oral Daily  . vancomycin  1,000 mg Intravenous Q24H    Recommendations: Continue anidulafungin while in house  And stop at discharge I would not use fluconazole with prolonged Qt I will stop vancomycin and cefepime, no current source of bacterial infection   Please call for any clinical changes. thanks  Assessment: He has 1/2 blood cultures with yeast noted but no growth to date.  Also with yeast in his urine.  Unclear significance and TTE without concerns noted for vegetation.  He is on anidulafungin and can continue this in house.  He has a prolonged Qt and so would avoid fluconazole.  Since he is improved, normal WBC, no fever, I do not feel he needs a picc line and prolonged antifungal.   Pneumonia - not hypoxic and stable now, will d/c antibiotics. UTI - no particular symptoms and would consider his culture asymptomatic bacteruria.      Antibiotics: Anidulafungin, vancomycin cefepime.   HPI: Eric Cameron is a 81 y.o. male with  Alzheimer's dementia, DM, lives at hme with sister who came in 3/15 with unresponsiveness which is not his usual self. Usually confused but alert.  No other concerns noted.  He was dehydrated on exam and admitted.  His initial WBC was 11.4 and MRI done and no acute findings. On 3/19 IV antibiotics were started after fever to 103 and felt source was urine.  Also felt to be pneumonia.  Developed prolonged Qt.   MRI independently reviewed and areas of dementia noted.   Review of Systems:  Unable to be assessed due to mental status All other systems reviewed and are negative   Past Medical History  Diagnosis Date  . Diabetes mellitus without complication (HCC)   . Hypertension   . Alzheimer disease   . Seizures (HCC)     Social History  Substance Use Topics  . Smoking status: Former Smoker    Quit date: 07/07/2010  . Smokeless tobacco: Never Used  . Alcohol Use: No    Family History  Problem Relation Age of Onset  . Diabetes Mellitus II Brother   . Dementia Neg Hx   . Colon cancer Father     No Known Allergies  Physical Exam: Constitutional: in no apparent distress and alert  Filed Vitals:  07/07/15 2130 07/08/15 0520  BP: 127/69 144/80  Pulse: 86 86  Temp: 98 F (36.7 C) 98 F (36.7 C)  Resp: 18 18   EYES: anicteric ENMT: no thrush Cardiovascular: Cor RRR Respiratory: CTA B; normal respiratory effort GI: Bowel sounds are normal, liver is not enlarged, spleen is not enlarged Musculoskeletal: no pedal edema noted, thin appearing Skin: negatives: no rash Hematologic: no cervical lad  Lab Results  Component Value Date   WBC 9.9 07/08/2015   HGB 10.1* 07/08/2015   HCT 31.1* 07/08/2015   MCV 89.6 07/08/2015   PLT 258 07/08/2015    Lab Results  Component Value Date   CREATININE 0.39* 07/08/2015   BUN 10 07/08/2015   NA 139 07/08/2015   K 3.2* 07/08/2015   CL 106 07/08/2015   CO2 22 07/08/2015    Lab Results  Component Value Date   ALT 16* 07/02/2015    AST 31 07/02/2015   ALKPHOS 45 07/02/2015     Microbiology: Recent Results (from the past 240 hour(s))  MRSA PCR Screening     Status: None   Collection Time: 07/01/15 10:09 PM  Result Value Ref Range Status   MRSA by PCR NEGATIVE NEGATIVE Final    Comment:        The GeneXpert MRSA Assay (FDA approved for NASAL specimens only), is one component of a comprehensive MRSA colonization surveillance program. It is not intended to diagnose MRSA infection nor to guide or monitor treatment for MRSA infections.   Urine culture     Status: None   Collection Time: 07/04/15 10:23 AM  Result Value Ref Range Status   Specimen Description URINE, CLEAN CATCH  Final   Special Requests NONE  Final   Culture   Final    >=100,000 COLONIES/mL AEROCOCCUS URINAE >=100,000 COLONIES/mL YEAST    Report Status 07/06/2015 FINAL  Final  Culture, blood (routine x 2)     Status: None (Preliminary result)   Collection Time: 07/04/15 10:00 PM  Result Value Ref Range Status   Specimen Description BLOOD RIGHT ARM  Final   Special Requests   Final    BOTTLES DRAWN AEROBIC AND ANAEROBIC 7CC BLUE,10CC RED   Culture NO GROWTH 3 DAYS  Final   Report Status PENDING  Incomplete  Culture, blood (routine x 2)     Status: None (Preliminary result)   Collection Time: 07/04/15 10:15 PM  Result Value Ref Range Status   Specimen Description BLOOD LEFT ARM  Final   Special Requests   Final    BOTTLES DRAWN AEROBIC AND ANAEROBIC 7CC BLUE,10CC RED   Culture  Setup Time   Final    YEAST WITH PSEUDOHYPHAE AEROBIC BOTTLE ONLY CRITICAL RESULT CALLED TO, READ BACK BY AND VERIFIED WITH: Ginette PitmanL BLACKWELL RN 1713 07/07/15 A BROWNING    Culture NO GROWTH 3 DAYS  Final   Report Status PENDING  Incomplete    Staci RighterOMER, Mickie, MD [facility] [email or web address] [phone] pager  [phone] 07/08/2015, 11:09 AM

## 2015-07-09 LAB — BASIC METABOLIC PANEL
Anion gap: 12 (ref 5–15)
BUN: 9 mg/dL (ref 6–20)
CHLORIDE: 107 mmol/L (ref 101–111)
CO2: 25 mmol/L (ref 22–32)
Calcium: 8.6 mg/dL — ABNORMAL LOW (ref 8.9–10.3)
Creatinine, Ser: 0.44 mg/dL — ABNORMAL LOW (ref 0.61–1.24)
GFR calc Af Amer: >60 mL/min (ref >60)
GFR calc non Af Amer: >60 mL/min (ref >60)
GLUCOSE: 122 mg/dL — AB (ref 65–99)
POTASSIUM: 4.4 mmol/L (ref 3.5–5.1)
Sodium: 144 mmol/L (ref 135–145)

## 2015-07-09 LAB — CULTURE, BLOOD (ROUTINE X 2): CULTURE: NO GROWTH

## 2015-07-09 LAB — GLUCOSE, CAPILLARY
GLUCOSE-CAPILLARY: 127 mg/dL — AB (ref 65–99)
GLUCOSE-CAPILLARY: 191 mg/dL — AB (ref 65–99)
Glucose-Capillary: 122 mg/dL — ABNORMAL HIGH (ref 65–99)
Glucose-Capillary: 289 mg/dL — ABNORMAL HIGH (ref 65–99)
Glucose-Capillary: 296 mg/dL — ABNORMAL HIGH (ref 65–99)

## 2015-07-09 NOTE — Clinical Social Work Note (Signed)
CSW spoke with patient's sister, [name], regarding patient's discharge disposition. Per patient's sister, patient's sister to meet with MD on 07/10/2015 am (between 8 and 9 o'clock) to discuss and evaluate discharge disposition. Patient's sister states she would prefer to have patient discharge home is capable, but is agreeable to SNF placement.  Patient's [facility]  CSW to follow-up with patient's sister regarding discharge disposition.  [name], LCSW [phone] Orthopedics: [phone] Surgical: [phone]

## 2015-07-09 NOTE — Progress Notes (Signed)
Inpatient Diabetes Program Recommendations  AACE/ADA: New Consensus Statement on Inpatient Glycemic Control (2015)  Target Ranges:  Prepandial:   less than 140 mg/dL      Peak postprandial:   less than 180 mg/dL (1-2 hours)      Critically ill patients:  140 - 180 mg/dL   Review of Glycemic Control:  Results for Eric Cameron, Eric Cameron (MRN [id number]) as of 07/09/2015 11:47  Ref. Range 07/08/2015 07:43 07/08/2015 11:52 07/08/2015 15:41 07/08/2015 20:42 07/08/2015 23:53 07/09/2015 04:44 07/09/2015 08:03  Glucose-Capillary Latest Ref Range: 65-99 mg/dL 782128 (H) 956229 (H) 213193 (H) 281 (H) 203 (H) 122 (H) 127 (H)   Diabetes history: Type 2 diabetes Outpatient Diabetes medications: Invokamet 50-500 mg bid Current orders for Inpatient glycemic control:  Novolog sensitive q 4 hours  Inpatient Diabetes Program Recommendations:    Please consider decreasing frequency of Novolog correction to tid with meals and HS. Also may consider adding low dose basal insulin while in the hospital. Consider Lantus 6 units daily.  Note that patient was on Invokamet 50-500 mg bid prior to admit. Consider d/c of this medication at discharge due to risk for dehydration and ketoacidosis.  Beryl MeagerJenny Jowel Waltner, RN, BC-ADM Inpatient Diabetes Coordinator Pager [phone] (8a-5p)

## 2015-07-09 NOTE — Progress Notes (Signed)
Speech Language Pathology Treatment: Cognitive-Linquistic  Patient Details Name: Norvell Caswell MRN: [id number] DOB: 03-23-1935 Today's Date: 07/09/2015 Time: 4098-1191 SLP Time Calculation (min) (ACUTE ONLY): 14 min  Assessment / Plan / Recommendation Clinical Impression  Pt seen for f/u cognitive treatment while self-feeding lunch meal. He independently stated his name and that he was in [place]. With Min question cues he knew he was in the hospital, but unsure which one despite binary choices offered. Max cues provided for increased temporal location. Throughout basic functional task, pt required minimal cueing for functional problem solving and following one-step commands. Given what is known about his baseline level of function from chart review, suspect that he is at or near his cognitive baseline, and plan is for d/c to SNF. Will defer any further cognitive f/u to SNF.   HPI HPI: [place] Grosser is a 81 y.o. male has a past medical history of Diabetes mellitus without complication ([place]); Hypertension; and Alzheimer disease. Was found in his bed unresponsive last time was seen normal last night. At his baseline he is able to walk on his own most of the time can dress himself with some minimal help his sister helps him bathe. He has history of dementia at baseline and usually confused. This morning he was responsive no fever no chest pain no other complaints. Patient has been in the fetal position ever since he was found. He had an MBS in 2015 that recommended Dys 3 diet and thin liquids, no aspiration/penetration observed although with oral dysphagia consistent with cognitive impairments.      SLP Plan  All goals met     Recommendations                Follow up Recommendations: 24 hour supervision/assistance Plan: All goals met     GO               Germain Osgood, M.A. CCC-SLP [phone]  Germain Osgood 07/09/2015, 2:43 PM

## 2015-07-09 NOTE — Progress Notes (Signed)
[facility] PROGRESS NOTE  Eric Cameron WUJ:[id number] DOB: 12/01/1934 DOA: 07/01/2015 PCP: Glenetta Borg Eric Corti, NP  Assessment/Plan: 1-Acute encephalopathy: Resolved, likely baseline.MRI negative, EEG negative for seizure.  Neurology consulted for evaluation and recommendation for anti seizure medications. Neurology thought AMS was secondary to worsening dementia, but subsequently during hospitalization patient spike fever. He has been receiving treatment for PNA, UTI.  IV fluids. Antibiotics has not been discontinued.  TSH low: free T 3 low at 1.7 and free T4 normal. started low dose synthroid.  ammonia level 28  Continue with Thiamine and folate preventive.  UA with 6-30 WBC, check urine culture. Started IV antibiotics.  Patient became less interactive 3-19, he spike fever at that day also. Started on antibiotics for pneumonia Today seen by ID, normal evidence for pneumonia or UTI. Vancomycin cefepime discontinued. Continue antifungal for yeast in  blood   2-Fever, leukocytosis: Resolved Patient received IV antibiotics for 3 days. Chest x ray with bilateral infiltrates.  Lactic acid Normal.  UA growing yeast and Aerococcus Urinae.   As above ID recommends to discontinue vancomycin and cefepime. Continue IV Eraxis  -HTN; resume medications when BP allows it and when able to tolerates oral.   3-Mild elevated troponin. Subsequent troponin negative. ECHO normal EF. EKG: with prolong Qt.  4-Prolong Qt; mg level normal.   5-History of CVA; aspirin Resume statins when tolerating oral.   6-Hypothyroidism; will start low dose synthroid, to see if this would help with encephalopathy, dementia.   7-Hypokalemia- replace potassium and check BMP in a.m.  8-DM; SSI. Hold oral hypoglycemic agent.   9-History of seizure; continue with vimpat.   10-right hip pressure ulcer. Stage 2; wound care consulted.      Code Status: DO NOT RESUSCITATE Family Communication:  Called and spoke to sister on phone. She says that patient has dementia and has baseline confusion. She will come to the hospital in a.m. and will reassess patient mental status with sister at bedside. Disposition Plan: Skilled nursing facility   Consultants:  Infectious disease  Procedures:  None  Antibiotics:  Vancomycin  Cefepime  Anidulafungin  HPI/Subjective: 81 y.o. male with Alzheimer's dementia, DM, lives at hme with sister who came in 3/15 with unresponsiveness which is not his usual self. Usually confused but alert. No other concerns noted. He was dehydrated on exam and admitted. His initial WBC was 11.4 and MRI done and no acute findings. On 3/19 IV antibiotics were started after fever went upto 103 and felt source was urine.  Patient seen and examined. He is alert and oriented. Denies any complaints.   Objective: Filed Vitals:   07/09/15 1026 07/09/15 1400  BP: 105/56 133/71  Pulse: 81 92  Temp: 99.1 F (37.3 C) 98.3 F (36.8 C)  Resp: 20 20    Intake/Output Summary (Last 24 hours) at 07/09/15 1449 Last data filed at 07/09/15 1200  Gross per 24 hour  Intake 3045.33 ml  Output    700 ml  Net 2345.33 ml   Filed Weights   07/01/15 1447 07/01/15 2156 07/03/15 1743  Weight: 52.164 kg (115 lb) 51.8 kg (114 lb 3.2 oz) 51.8 kg (114 lb 3.2 oz)    Exam:   General:  Appears in no acute distress  Cardiovascular: S1-S2 normal, regular rhythm  Respiratory: Clear to auscultation bilaterally  Abdomen: Soft, nontender, no organomegaly  Musculoskeletal: No cyanosis/clubbing/edema of the lower extremities   Data Reviewed: Basic Metabolic Panel:  Recent Labs Lab 07/05/15 0716 07/06/15 1117 07/07/15 1537 07/08/15  0420 07/09/15 0430  NA 140 135 139 139 144  K 4.3 4.2 4.1 3.2* 4.4  CL 104 101 104 106 107  CO2 25 21* 21* 22 25  GLUCOSE 128* 192* 229* 66 122*  BUN CREATININE 0.36* 0.51* 0.49* 0.39* 0.44*  CALCIUM 9.0 8.4* 8.5* 8.7*  8.6*   Liver Function Tests: No results for input(s): AST, ALT, ALKPHOS, BILITOT, PROT, ALBUMIN in the last 168 hours. No results for input(s): AMMONIA in the last 168 hours. CBC:  Recent Labs Lab 07/03/15 1229 07/05/15 0716 07/06/15 1117 07/07/15 1537 07/08/15 0420  WBC 8.9 11.3* 13.4* 10.1 9.9  HGB 11.1* 11.1* 11.2* 10.2* 10.1*  HCT 33.7* 34.6* 34.5* 32.2* 31.1*  MCV 92.1 90.1 91.3 90.2 89.6  PLT 189 180 179 240 258   Cardiac Enzymes:  Recent Labs Lab 07/02/15 1835 07/03/15 0015 07/03/15 0731 07/03/15 1229  TROPONINI 0.03 <0.03 <0.03 <0.03    CBG:  Recent Labs Lab 07/08/15 2042 07/08/15 2353 07/09/15 0444 07/09/15 0803 07/09/15 1235  GLUCAP 281* 203* 122* 127* 289*    Recent Results (from the past 240 hour(s))  MRSA PCR Screening     Status: None   Collection Time: 07/01/15 10:09 PM  Result Value Ref Range Status   MRSA by PCR NEGATIVE NEGATIVE Final    Comment:        The GeneXpert MRSA Assay (FDA approved for NASAL specimens only), is one component of a comprehensive MRSA colonization surveillance program. It is not intended to diagnose MRSA infection nor to guide or monitor treatment for MRSA infections.   Urine culture     Status: None   Collection Time: 07/04/15 10:23 AM  Result Value Ref Range Status   Specimen Description URINE, CLEAN CATCH  Final   Special Requests NONE  Final   Culture   Final    >=100,000 COLONIES/mL AEROCOCCUS URINAE >=100,000 COLONIES/mL YEAST    Report Status 07/06/2015 FINAL  Final  Culture, blood (routine x 2)     Status: None   Collection Time: 07/04/15 10:00 PM  Result Value Ref Range Status   Specimen Description BLOOD RIGHT ARM  Final   Special Requests   Final    BOTTLES DRAWN AEROBIC AND ANAEROBIC 7CC BLUE,10CC RED   Culture NO GROWTH 5 DAYS  Final   Report Status 07/09/2015 FINAL  Final  Culture, blood (routine x 2)     Status: None   Collection Time: 07/04/15 10:15 PM  Result Value Ref Range  Status   Specimen Description BLOOD LEFT ARM  Final   Special Requests   Final    BOTTLES DRAWN AEROBIC AND ANAEROBIC 7CC BLUE,10CC RED   Culture  Setup Time   Final    YEAST WITH PSEUDOHYPHAE AEROBIC BOTTLE ONLY CRITICAL RESULT CALLED TO, READ BACK BY AND VERIFIED WITHGinette Pitman RN 1713 07/07/15 A BROWNING    Culture CANDIDA ALBICANS  Final   Report Status 07/09/2015 FINAL  Final     Studies: No results found.  Scheduled Meds: . anidulafungin  100 mg Intravenous Q24H  . aspirin  300 mg Rectal Daily   Or  . aspirin  325 mg Oral Daily  . atorvastatin  10 mg Oral q1800  . citalopram  10 mg Oral Daily  . feeding supplement (ENSURE ENLIVE)  237 mL Oral BID BM  . folic acid  1 mg Oral Daily  . insulin aspart  0-9 Units Subcutaneous 6 times per day  .  lacosamide  50 mg Oral BID  . levothyroxine  25 mcg Oral QAC breakfast  . memantine  10 mg Oral BID  . multivitamin with minerals  1 tablet Oral Daily  . sodium chloride flush  3 mL Intravenous Q12H  . thiamine  100 mg Oral Daily   Continuous Infusions: . sodium chloride 100 mL/hr at 07/09/15 0445    Active Problems:   Acute encephalopathy   HTN (hypertension)   Diabetes mellitus type 2, uncontrolled (HCC)   Dementia   Seizures (HCC)   Elevated troponin   Dehydration   Pressure ulcer   PNA (pneumonia)    Time spent: 25 min    [facility] Center[facility],Lousie Calico S  [facility] Pager [phone]. If 7PM-7AM, please contact night-coverage at [email or web address], password [email or web address] 07/09/2015, 2:49 PM  LOS: 7 days

## 2015-07-09 NOTE — Clinical Documentation Improvement (Signed)
Hospitalist  Can the diagnosis of Malnutrition be further specified?   Document Severity - Severe(third degree), Moderate (second degree), Mild (first degree)  Form - [place] (rarely seen in the [place] Marasmus, Other Condition, Unable to Determine  Other condition  Unable to clinically determine  Document any associated diagnoses/conditions   Supporting Information: :  Severe Malnutrition related to chronic illness as evidenced by severe depletion of muscle mass, severe depletion of body fat per 3/21 Registered Dietician's evaluation.   Please exercise your independent, professional judgment when responding. A specific answer is not anticipated or expected.   Thank Sabino DonovanYou, Carleena Mires [facility] [employer] [facility] [place] [phone]

## 2015-07-09 NOTE — Progress Notes (Signed)
Physical Therapy Treatment Patient Details Name: Eric CopaRobert Cameron MRN: [id number] DOB: 1934/10/11 Today's Date: 07/09/2015    History of Present Illness Eric Cameron is a 81 y.o. male has a past medical history of Diabetes mellitus without complication (HCC); Hypertension; and Alzheimer disease. Was found in his bed unresponsive last time was seen normal last night. At his baseline he is able to walk on his own most of the time can dress himself with some minimal help his sister helps him bathe. He has history of dementia at baseline and usually confused. This morning he was responsive no fever no chest pain no other complaints. Patient has been in the fetal position ever since he was found.     PT Comments    Patient progressing with mobility and gait.  Continues to demonstrate unsteady gait.  Agree with need for SNF at discharge.  Follow Up Recommendations  Supervision/Assistance - 24 hour;SNF     Equipment Recommendations  Other (comment) (TBD)    Recommendations for Other Services       Precautions / Restrictions Precautions Precautions: Fall Restrictions Weight Bearing Restrictions: No    Mobility  Bed Mobility Overal bed mobility: Needs Assistance Bed Mobility: Supine to Sit     Supine to sit: Supervision     General bed mobility comments: No physical assist needed.  Assist for safety.  Transfers Overall transfer level: Needs assistance Equipment used: 1 person hand held assist Transfers: Sit to/from Stand Sit to Stand: Min assist         General transfer comment: Assist to steady during transfers  Ambulation/Gait Ambulation/Gait assistance: Min assist Ambulation Distance (Feet): 400 Feet Assistive device: 1 person hand held assist Gait Pattern/deviations: Step-through pattern;Decreased stride length;Shuffle;Narrow base of support;Scissoring Gait velocity: slightly decreased Gait velocity interpretation: Below normal speed for age/gender General Gait  Details: Unsteady gait with narrow base of support.  Assist required to maintain balance.  Stood at end of gait for pericare - patient incontinent.   Stairs            Wheelchair Mobility    Modified Rankin (Stroke Patients Only)       Balance                                    Cognition Arousal/Alertness: Awake/alert Behavior During Therapy: Flat affect Overall Cognitive Status: History of cognitive impairments - at baseline                      Exercises      General Comments        Pertinent Vitals/Pain Pain Assessment: No/denies pain    Home Living                      Prior Function            PT Goals (current goals can now be found in the care plan section) Progress towards PT goals: Progressing toward goals    Frequency  Min 2X/week    PT Plan Current plan remains appropriate;Frequency needs to be updated    Co-evaluation             End of Session Equipment Utilized During Treatment: Gait belt Activity Tolerance: Patient tolerated treatment well Patient left: in chair;with call bell/phone within reach;with chair alarm set;with nursing/sitter in room     Time: [id number] PT Time Calculation (min) (ACUTE ONLY):  17 min  Charges:  $Gait Training: 8-22 mins                    G Codes:      Eric Cameron 07-16-2015, 10:49 AM Eric Cameron. Eric Cameron, [facility] Pager [phone]

## 2015-07-10 LAB — GLUCOSE, CAPILLARY
GLUCOSE-CAPILLARY: 136 mg/dL — AB (ref 65–99)
GLUCOSE-CAPILLARY: 144 mg/dL — AB (ref 65–99)
GLUCOSE-CAPILLARY: 235 mg/dL — AB (ref 65–99)
Glucose-Capillary: 240 mg/dL — ABNORMAL HIGH (ref 65–99)

## 2015-07-10 MED ORDER — THIAMINE HCL 100 MG PO TABS: 100.0000 mg | ORAL_TABLET | Freq: Every day | ORAL | Status: DC

## 2015-07-10 MED ORDER — INSULIN GLARGINE 100 UNIT/ML ~~LOC~~ SOLN: 6.0000 [IU] | Freq: Every day | SUBCUTANEOUS | Status: DC

## 2015-07-10 MED ORDER — FOLIC ACID 1 MG PO TABS: 1.0000 mg | ORAL_TABLET | Freq: Every day | ORAL | Status: AC

## 2015-07-10 MED ORDER — INSULIN ASPART 100 UNIT/ML ~~LOC~~ SOLN: 0.0000 [IU] | SUBCUTANEOUS | Status: DC

## 2015-07-10 NOTE — Clinical Social Work Note (Signed)
Clinical Social Worker facilitated patient discharge including contacting patient family and facility to confirm patient discharge plans.  Clinical information faxed to facility and family agreeable with plan.  CSW [facility]  RN to call report prior to discharge.  Clinical Social Worker will sign off for now as social work intervention is no longer needed. Please consult us again if new need arises.  Eric Cameron, MSW, LCSWA [phone] 07/10/2015 1:54 PM

## 2015-07-10 NOTE — Clinical Social Work Placement (Signed)
   CLINICAL SOCIAL WORK PLACEMENT  NOTE  Date:  07/10/2015  Patient Details  Name: Eric Cameron MRN: [id number] Date of Birth: 30-Jun-1934  Clinical Social Work is seeking post-discharge placement for this patient at the Skilled  Nursing Facility level of care (*CSW will initial, date and re-position this form in  chart as items are completed):  Yes   Patient/family provided with [facility] Clinical Social Work Department's list of facilities offering this level of care within the geographic area requested by the patient (or if unable, by the patient's family).  Yes   Patient/family informed of their freedom to choose among providers that offer the needed level of care, that participate in Medicare, Medicaid or managed care program needed by the patient, have an available bed and are willing to accept the patient.  Yes   Patient/family informed of [facility] ownership interest [facility] and [facility], as well as of the fact that they are under no obligation to receive care at these facilities.  PASRR submitted to EDS on 07/07/15     PASRR number received on 07/07/15     Existing PASRR number confirmed on       FL2 transmitted to all facilities in geographic area requested by pt/family on 07/09/15     FL2 transmitted to all facilities within larger geographic area on       Patient informed that his/her managed care company has contracts with or will negotiate with certain facilities, including the following:        Yes   Patient/family informed of bed offers received.  Patient chooses bed at  [facility] )     Physician recommends and patient chooses bed at      Patient to be transferred to  [facility] ) on 07/10/15.  Patient to be transferred to facility by  [facility] )     Patient family notified on 07/10/15 of transfer.  Name of family member notified:   (Pt's sister, Eric Cameron )     PHYSICIAN Please prepare priority  discharge summary, including medications     Additional Comment:   _______________________________________________ Vaughan BrownerNixon, Alejandro Adcox A, LCSW 07/10/2015, 1:53 PM

## 2015-07-10 NOTE — Discharge Summary (Signed)
Physician Discharge Summary  Kaedon Fanelli ZOX:[id number] DOB: 11/20/1934 DOA: 07/01/2015  PCP: Lizbeth Bark, NP  Admit date: 07/01/2015 Discharge date: 07/10/2015  Time spent: 25* minutes  Recommendations for Outpatient Follow-up:  1. Follow up PCP in one week   Discharge Diagnoses:  Active Problems:   Acute encephalopathy   HTN (hypertension)   Diabetes mellitus type 2, uncontrolled (HCC)   Dementia   Seizures (HCC)   Elevated troponin   Dehydration   Pressure ulcer   PNA (pneumonia)   Discharge Condition: Stable  Diet recommendation: Regular diet  Filed Weights   07/01/15 1447 07/01/15 2156 07/03/15 1743  Weight: 52.164 kg (115 lb) 51.8 kg (114 lb 3.2 oz) 51.8 kg (114 lb 3.2 oz)    History of present illness:  81 y.o. male with Alzheimer's dementia, DM, lives at hme with sister who came in 3/15 with unresponsiveness which is not his usual self. Usually confused but alert. No other concerns noted. He was dehydrated on exam and admitted. His initial WBC was 11.4 and MRI done and no acute findings. On 3/19 IV antibiotics were started after fever went upto 103 and felt source was urine.   Hospital Course:   -Acute encephalopathy: Resolved, likely baseline.MRI negative, EEG negative for seizure.  Neurology consulted for evaluation and recommendation for anti seizure medications. Neurology thought AMS was secondary to worsening dementia, but subsequently during hospitalization patient spike fever. He has been receiving treatment for PNA, UTI.  IV fluids. Antibiotics has not been discontinued.  TSH low: free T 3 low at 1.7 and free T4 normal ammonia level 28  Continue with Thiamine and folate preventive.  UA with 6-30 WBC, check urine culture. Started IV antibiotics, which were discontinued by infectious disease. Patient became less interactive 3-19, he spike fever at that day also. Started on antibiotics for pneumonia Patient was seen by  ID, no vidence for  pneumonia or UTI. Vancomycin cefepime discontinued.  Fungemia Patient had one out of 2 bottles growing he states with pseudohyphae Patient was started on IV Eraxis for fungemia. Infectious disease recommended to continue IV antifungal while in the hospital and no need of PICC line or prolonged antifungal therapy. Patient has received 4 days of therapy. No Diflucan recommended as patient has prolonged QT interval. Will discontinue IV antifungal Eraxis and discharge patient on no therapy as per ID recommendation. I called and discussed with Dr. Rosetta Posner.    2-Fever, leukocytosis: Resolved Patient received IV antibiotics for 3 days. Chest x ray with bilateral infiltrates.  Lactic acid Normal.  UA growing yeast and Aerococcus Urinae.  As above ID recommends to discontinue vancomycin and cefepime.   -HTN; resume medications when BP allows it and when able to tolerates oral.   3-Mild elevated troponin. Subsequent troponin negative. ECHO normal EF. EKG: with prolong Qt.  4-Prolong Qt; mg level normal.   5-History of CVA; aspirin Resume statins when tolerating oral.   8-DM; SSI. Will discontinue oral hypoglycemic agents. Also start Lantus 6 units subcutaneous daily.  9-History of seizure; continue with vimpat.   10-right hip pressure ulcer. Stage 2; wound care consulted.   Procedures:  EEG  Consultations:  Neurology  Infectious disease  Discharge Exam: Filed Vitals:   07/10/15 0637 07/10/15 1054  BP: 124/59 117/66  Pulse: 78 85  Temp: 98 F (36.7 C) 97.7 F (36.5 C)  Resp: 18 18    General: Appears in no acute distress Cardiovascular: S1-S2 regular Respiratory: Clear to auscultation bilaterally  Discharge Instructions  Discharge Instructions    Diet - low sodium heart healthy    Complete by:  As directed      Increase activity slowly    Complete by:  As directed           Current Discharge Medication List    START taking these medications   Details   folic acid (FOLVITE) 1 MG tablet Take 1 tablet (1 mg total) by mouth daily.    insulin aspart (NOVOLOG) 100 UNIT/ML injection Inject 0-9 Units into the skin every 4 (four) hours. Qty: 10 mL, Refills: 11    insulin glargine (LANTUS) 100 UNIT/ML injection Inject 0.06 mLs (6 Units total) into the skin at bedtime. Qty: 10 mL, Refills: 11    thiamine 100 MG tablet Take 1 tablet (100 mg total) by mouth daily.      CONTINUE these medications which have NOT CHANGED   Details  amLODipine-benazepril (LOTREL) 5-10 MG per capsule Take 1 capsule by mouth daily.     aspirin 325 MG tablet Take 1 tablet (325 mg total) by mouth daily.    atorvastatin (LIPITOR) 10 MG tablet Take 10 mg by mouth daily at 6 PM.    citalopram (CELEXA) 10 MG tablet Take 10 mg by mouth daily.     memantine (NAMENDA) 10 MG tablet Take 10 mg by mouth 2 (two) times daily.     lacosamide (VIMPAT) 50 MG TABS tablet Take 1 tablet (50 mg total) by mouth 2 (two) times daily. Qty: 60 tablet, Refills: 0      STOP taking these medications     INVOKAMET 50-500 MG TABS        No Known Allergies    The results of significant diagnostics from this hospitalization (including imaging, microbiology, ancillary and laboratory) are listed below for reference.    Significant Diagnostic Studies: Ct Head Wo Contrast  07/01/2015  CLINICAL DATA:  Altered mental status.  Lethargic. EXAM: CT HEAD WITHOUT CONTRAST TECHNIQUE: Contiguous axial images were obtained from the base of the skull through the vertex without intravenous contrast. COMPARISON:  CT scan dated 11/30/2013 and MRI dated 12/01/2013 FINDINGS: No mass lesion. No midline shift. No acute hemorrhage or hematoma. No extra-axial fluid collections. No evidence of acute infarction. There is diffuse cerebral cortical atrophy with secondary slight ventricular dilatation. Extensive benign calcification along the interhemispheric falx. Bones are normal. Previous pontine infarcts  demonstrated on MRI are not appreciable on this exam. IMPRESSION: No acute intracranial abnormality.  Diffuse atrophy. Electronically Signed   By: Francene BoyersJames  Maxwell M.D.   On: 07/01/2015 16:36   Mr Brain Wo Contrast  07/02/2015  CLINICAL DATA:  Initial evaluation for altered mental status. EXAM: MRI HEAD WITHOUT CONTRAST TECHNIQUE: Multiplanar, multiecho pulse sequences of the brain and surrounding structures were obtained without intravenous contrast. COMPARISON:  Prior CT from 07/01/2015. FINDINGS: Study mildly degraded by motion artifact. Diffuse prominence of the CSF containing spaces is compatible with generalized age-related cerebral atrophy. Patchy T2/FLAIR hyperintensity within the central pons likely related to chronic small vessel ischemic disease. Probable minimal small vessel type changes present within the periventricular white matter as well. No abnormal foci of restricted diffusion to suggest acute intracranial infarct. Major intracranial vascular flow voids are maintained. No acute or chronic intracranial hemorrhage. No areas of chronic infarction. No mass lesion, midline shift, or mass effect. Ventricular prominence related to global parenchymal volume loss without hydrocephalus. No extra-axial fluid collection. Craniocervical junction within normal limits. Scattered multilevel degenerative spondylolysis within the visualized  upper cervical spine without significant stenosis. Pituitary gland grossly normal. No acute abnormality about the orbits. Paranasal sinuses are grossly clear. No mastoid effusion. Inner ear structures grossly normal. Bone marrow signal intensity within normal limits. No scalp soft tissue abnormality. IMPRESSION: 1. No acute intracranial process identified. 2. Mild age-related cerebral atrophy with chronic small vessel ischemic disease. Electronically Signed   By: Rise Mu M.D.   On: 07/02/2015 22:31   Dg Chest Port 1 View  07/05/2015  CLINICAL DATA:  Acute onset  of fever.  Initial encounter. EXAM: PORTABLE CHEST 1 VIEW COMPARISON:  Chest radiograph performed 07/01/2015 FINDINGS: Mild bibasilar airspace opacities raise question for mild pneumonia. No pleural effusion or pneumothorax is seen. The cardiomediastinal silhouette is normal in size. No acute osseous abnormalities are seen. IMPRESSION: Mild bibasilar airspace opacities raise question for mild pneumonia. Electronically Signed   By: Roanna Raider M.D.   On: 07/05/2015 00:45   Dg Chest Portable 1 View  07/01/2015  CLINICAL DATA:  Altered mental status, was walking and talking yesterday evening, unable to do either today EXAM: PORTABLE CHEST 1 VIEW COMPARISON:  Portable exam 1602 hours compared 11/30/2013 FINDINGS: Normal heart size and pulmonary vascularity. Calcification and tortuosity of thoracic aorta. Bibasilar atelectasis. Skin fold projects over RIGHT lung. No definite acute infiltrate, pleural effusion or pneumothorax. Bones demineralized. IMPRESSION: Bibasilar atelectasis. Electronically Signed   By: Ulyses Southward M.D.   On: 07/01/2015 16:09    Microbiology: Recent Results (from the past 240 hour(s))  MRSA PCR Screening     Status: None   Collection Time: 07/01/15 10:09 PM  Result Value Ref Range Status   MRSA by PCR NEGATIVE NEGATIVE Final    Comment:        The GeneXpert MRSA Assay (FDA approved for NASAL specimens only), is one component of a comprehensive MRSA colonization surveillance program. It is not intended to diagnose MRSA infection nor to guide or monitor treatment for MRSA infections.   Urine culture     Status: None   Collection Time: 07/04/15 10:23 AM  Result Value Ref Range Status   Specimen Description URINE, CLEAN CATCH  Final   Special Requests NONE  Final   Culture   Final    >=100,000 COLONIES/mL AEROCOCCUS URINAE >=100,000 COLONIES/mL YEAST    Report Status 07/06/2015 FINAL  Final  Culture, blood (routine x 2)     Status: None   Collection Time: 07/04/15  10:00 PM  Result Value Ref Range Status   Specimen Description BLOOD RIGHT ARM  Final   Special Requests   Final    BOTTLES DRAWN AEROBIC AND ANAEROBIC 7CC BLUE,10CC RED   Culture NO GROWTH 5 DAYS  Final   Report Status 07/09/2015 FINAL  Final  Culture, blood (routine x 2)     Status: None   Collection Time: 07/04/15 10:15 PM  Result Value Ref Range Status   Specimen Description BLOOD LEFT ARM  Final   Special Requests   Final    BOTTLES DRAWN AEROBIC AND ANAEROBIC 7CC BLUE,10CC RED   Culture  Setup Time   Final    YEAST WITH PSEUDOHYPHAE AEROBIC BOTTLE ONLY CRITICAL RESULT CALLED TO, READ BACK BY AND VERIFIED WITHGinette Pitman RN 1713 07/07/15 A BROWNING    Culture CANDIDA ALBICANS  Final   Report Status 07/09/2015 FINAL  Final     Labs: Basic Metabolic Panel:  Recent Labs Lab 07/05/15 0716 07/06/15 1117 07/07/15 1537 07/08/15 0420 07/09/15 0430  NA 140 135  139 139 144  K 4.3 4.2 4.1 3.2* 4.4  CL 104 101 104 106 107  CO2 25 21* 21* 22 25  GLUCOSE 128* 192* 229* 66 122*  BUN CREATININE 0.36* 0.51* 0.49* 0.39* 0.44*  CALCIUM 9.0 8.4* 8.5* 8.7* 8.6*   Liver Function Tests: No results for input(s): AST, ALT, ALKPHOS, BILITOT, PROT, ALBUMIN in the last 168 hours. No results for input(s): LIPASE, AMYLASE in the last 168 hours. No results for input(s): AMMONIA in the last 168 hours. CBC:  Recent Labs Lab 07/03/15 1229 07/05/15 0716 07/06/15 1117 07/07/15 1537 07/08/15 0420  WBC 8.9 11.3* 13.4* 10.1 9.9  HGB 11.1* 11.1* 11.2* 10.2* 10.1*  HCT 33.7* 34.6* 34.5* 32.2* 31.1*  MCV 92.1 90.1 91.3 90.2 89.6  PLT 189 180 179 240 258   Cardiac Enzymes:  Recent Labs Lab 07/03/15 1229  TROPONINI <0.03   BNP:  CBG:  Recent Labs Lab 07/09/15 1643 07/09/15 2012 07/10/15 0020 07/10/15 0410 07/10/15 0735  GLUCAP 296* 191* 235* 136* 144*       Signed:  Meredeth Ide MD.  [facility] 07/10/2015, 11:37 AM

## 2015-07-13 ENCOUNTER — Encounter: Payer: Self-pay | Admitting: Internal Medicine

## 2015-07-13 ENCOUNTER — Non-Acute Institutional Stay (SKILLED_NURSING_FACILITY): Payer: Medicaid Other | Admitting: Internal Medicine

## 2015-07-13 DIAGNOSIS — D72829 Elevated white blood cell count, unspecified: Secondary | ICD-10-CM

## 2015-07-13 DIAGNOSIS — E1149 Type 2 diabetes mellitus with other diabetic neurological complication: Secondary | ICD-10-CM

## 2015-07-13 DIAGNOSIS — L89212 Pressure ulcer of right hip, stage 2: Secondary | ICD-10-CM

## 2015-07-13 DIAGNOSIS — R569 Unspecified convulsions: Secondary | ICD-10-CM

## 2015-07-13 DIAGNOSIS — E1165 Type 2 diabetes mellitus with hyperglycemia: Secondary | ICD-10-CM

## 2015-07-13 DIAGNOSIS — R9431 Abnormal electrocardiogram [ECG] [EKG]: Secondary | ICD-10-CM

## 2015-07-13 DIAGNOSIS — B49 Unspecified mycosis: Secondary | ICD-10-CM

## 2015-07-13 DIAGNOSIS — I10 Essential (primary) hypertension: Secondary | ICD-10-CM | POA: Diagnosis not present

## 2015-07-13 DIAGNOSIS — I4581 Long QT syndrome: Secondary | ICD-10-CM | POA: Diagnosis not present

## 2015-07-13 DIAGNOSIS — G9340 Encephalopathy, unspecified: Secondary | ICD-10-CM

## 2015-07-13 DIAGNOSIS — IMO0002 Reserved for concepts with insufficient information to code with codable children: Secondary | ICD-10-CM

## 2015-07-13 DIAGNOSIS — Z8673 Personal history of transient ischemic attack (TIA), and cerebral infarction without residual deficits: Secondary | ICD-10-CM

## 2015-07-13 NOTE — Progress Notes (Signed)
Patient ID: Eric Cameron, male   DOB: June 03, 1934, 81 y.o.   MRN: [id number] MRN: [id number] Name: Eric Cameron  Sex: male Age: 81 y.o. DOB: June 03, 1934  [facility] Facility/Room: 119 A Level Of Care: SNF Provider: Merrilee SeashoreAlexander, Eward Rutigliano Emergency Contacts: Extended Emergency Contact Information Primary Emergency Contact: [name],[name] Address: [address]          [place] [id number] [place] Home Phone: [phone] Relation: Sister  Code Status:   Allergies: Review of patient's allergies indicates no known allergies.  Chief Complaint  Patient presents with  . New Admit To SNF    HPI: Patient is 81 y.o. male with Alzheimer's dementia, DM, lives at hme with sister who came in 3/15 with . [facility] where he was dx with leukocytosis and exhibited acute encephalopathy and felt to have a UTI and PNA. Treatment was started with IV antibiotics which were stopped by ID. Subsequently was diagnosed with fungemia which was treated full course in the hospital.  Pt is admitted to SNF for generalized weakness. While at SNF pt will be followed for HTN, tx with lotrel, h/o CVA, tx with ASA and statins and h/o seizures tx with vimpat.  Past Medical History  Diagnosis Date  . Diabetes mellitus without complication (HCC)   . Hypertension   . Alzheimer disease   . Seizures (HCC)   . Acute encephalopathy   . Dementia   . Elevated troponin   . Dehydration   . Pressure ulcer   . PNA (pneumonia)     Past Surgical History  Procedure Laterality Date  . No past surgeries        Medication List       This list is accurate as of: 07/13/15 11:59 PM.  Always use your most recent med list.               amLODipine-benazepril 5-10 MG capsule  Commonly known as:  LOTREL  Take 1 capsule by mouth daily.     aspirin 325 MG tablet  Take 1 tablet (325 mg total) by mouth daily.     atorvastatin 10 MG tablet  Commonly known as:  LIPITOR  Take 10  mg by mouth daily at 6 PM.     citalopram 10 MG tablet  Commonly known as:  CELEXA  Take 10 mg by mouth daily.     folic acid 1 MG tablet  Commonly known as:  FOLVITE  Take 1 tablet (1 mg total) by mouth daily.     insulin aspart 100 UNIT/ML injection  Commonly known as:  novoLOG  Inject 0-9 Units into the skin every 4 (four) hours.     insulin glargine 100 UNIT/ML injection  Commonly known as:  LANTUS  Inject 0.06 mLs (6 Units total) into the skin at bedtime.     lacosamide 50 MG Tabs tablet  Commonly known as:  VIMPAT  Take 1 tablet (50 mg total) by mouth 2 (two) times daily.     memantine 10 MG tablet  Commonly known as:  NAMENDA  Take 10 mg by mouth 2 (two) times daily.     thiamine 100 MG tablet  Take 1 tablet (100 mg total) by mouth daily.        No orders of the defined types were placed in this encounter.    Immunization History  Administered Date(s) Administered  . Influenza,inj,Quad PF,36+ Mos 07/07/2015    Social History  Substance Use Topics  .  Smoking status: Former Smoker    Quit date: 07/07/2010  . Smokeless tobacco: Never Used  . Alcohol Use: No    Family history is + DM, brother  Review of Systems  UTO 2/2 dementia; nursing without immediate concerns    Filed Vitals:   07/13/15 1210  BP: 118/78  Pulse: 72  Temp: 98.6 F (37 C)  Resp: 20    SpO2 Readings from Last 1 Encounters:  07/13/15 97%        Physical Exam  GENERAL APPEARANCE: Alert, min conversant,  No acute distress.  SKIN: No diaphoresis rash HEAD: Normocephalic, atraumatic  EYES: Conjunctiva/lids clear. Pupils round, reactive. EOMs intact.  EARS: External exam WNL, canals clear. Hearing grossly normal.  NOSE: No deformity or discharge.  MOUTH/THROAT: Lips w/o lesions  RESPIRATORY: Breathing is even, unlabored. Lung sounds are clear   CARDIOVASCULAR: Heart RRR no murmurs, rubs or gallops. No peripheral edema.   GASTROINTESTINAL: Abdomen is soft, non-tender, not  distended w/ normal bowel sounds. GENITOURINARY: Bladder non tender, not distended  MUSCULOSKELETAL: No abnormal joints or musculature NEUROLOGIC:  Cranial nerves 2-12 grossly intact. Moves all extremities, LE weak  PSYCHIATRIC: dementia, no behavioral issues  Patient Active Problem List   Diagnosis Date Noted  . Fungemia 07/18/2015  . Leukocytosis 07/18/2015  . Prolonged Q-T interval on ECG 07/18/2015  . Pressure ulcer of hip 07/06/2015  . PNA (pneumonia) 07/06/2015  . Elevated troponin 07/01/2015  . Dehydration 07/01/2015  . Seizures (HCC) 12/03/2013  . H/O: CVA (cerebrovascular accident) 12/01/2013  . Altered mental status 11/30/2013  . Acute encephalopathy 11/30/2013  . HTN (hypertension) 11/30/2013  . Diabetes mellitus type 2, uncontrolled (HCC) 11/30/2013  . Dementia 11/30/2013  . HLD (hyperlipidemia) 11/30/2013    CBC    Component Value Date/Time   WBC 9.9 07/08/2015 0420   RBC 3.47* 07/08/2015 0420   HGB 10.1* 07/08/2015 0420   HCT 31.1* 07/08/2015 0420   PLT 258 07/08/2015 0420   MCV 89.6 07/08/2015 0420   LYMPHSABS 1.8 11/30/2013 2251   MONOABS 0.7 11/30/2013 2251   EOSABS 0.0 11/30/2013 2251   BASOSABS 0.0 11/30/2013 2251    CMP     Component Value Date/Time   NA 144 07/09/2015 0430   K 4.4 07/09/2015 0430   CL 107 07/09/2015 0430   CO2 25 07/09/2015 0430   GLUCOSE 122* 07/09/2015 0430   BUN 9 07/09/2015 0430   CREATININE 0.44* 07/09/2015 0430   CALCIUM 8.6* 07/09/2015 0430   PROT 6.6 07/02/2015 0405   ALBUMIN 3.1* 07/02/2015 0405   AST 31 07/02/2015 0405   ALT 16* 07/02/2015 0405   ALKPHOS 45 07/02/2015 0405   BILITOT 0.8 07/02/2015 0405   GFRNONAA >60 07/09/2015 0430   GFRAA >60 07/09/2015 0430    Lab Results  Component Value Date   HGBA1C 5.5 07/02/2015     Ct Head Wo Contrast  07/01/2015  CLINICAL DATA:  Altered mental status.  Lethargic. EXAM: CT HEAD WITHOUT CONTRAST TECHNIQUE: Contiguous axial images were obtained from the base of  the skull through the vertex without intravenous contrast. COMPARISON:  CT scan dated 11/30/2013 and MRI dated 12/01/2013 FINDINGS: No mass lesion. No midline shift. No acute hemorrhage or hematoma. No extra-axial fluid collections. No evidence of acute infarction. There is diffuse cerebral cortical atrophy with secondary slight ventricular dilatation. Extensive benign calcification along the interhemispheric falx. Bones are normal. Previous pontine infarcts demonstrated on MRI are not appreciable on this exam. IMPRESSION: No acute intracranial abnormality.  Diffuse atrophy. Electronically Signed   By: Francene Boyers M.D.   On: 07/01/2015 16:36   Mr Brain Wo Contrast  07/02/2015  CLINICAL DATA:  Initial evaluation for altered mental status. EXAM: MRI HEAD WITHOUT CONTRAST TECHNIQUE: Multiplanar, multiecho pulse sequences of the brain and surrounding structures were obtained without intravenous contrast. COMPARISON:  Prior CT from 07/01/2015. FINDINGS: Study mildly degraded by motion artifact. Diffuse prominence of the CSF containing spaces is compatible with generalized age-related cerebral atrophy. Patchy T2/FLAIR hyperintensity within the central pons likely related to chronic small vessel ischemic disease. Probable minimal small vessel type changes present within the periventricular white matter as well. No abnormal foci of restricted diffusion to suggest acute intracranial infarct. Major intracranial vascular flow voids are maintained. No acute or chronic intracranial hemorrhage. No areas of chronic infarction. No mass lesion, midline shift, or mass effect. Ventricular prominence related to global parenchymal volume loss without hydrocephalus. No extra-axial fluid collection. Craniocervical junction within normal limits. Scattered multilevel degenerative spondylolysis within the visualized upper cervical spine without significant stenosis. Pituitary gland grossly normal. No acute abnormality about the orbits.  Paranasal sinuses are grossly clear. No mastoid effusion. Inner ear structures grossly normal. Bone marrow signal intensity within normal limits. No scalp soft tissue abnormality. IMPRESSION: 1. No acute intracranial process identified. 2. Mild age-related cerebral atrophy with chronic small vessel ischemic disease. Electronically Signed   By: Rise Mu M.D.   On: 07/02/2015 22:31   Dg Chest Portable 1 View  07/01/2015  CLINICAL DATA:  Altered mental status, was walking and talking yesterday evening, unable to do either today EXAM: PORTABLE CHEST 1 VIEW COMPARISON:  Portable exam 1602 hours compared 11/30/2013 FINDINGS: Normal heart size and pulmonary vascularity. Calcification and tortuosity of thoracic aorta. Bibasilar atelectasis. Skin fold projects over RIGHT lung. No definite acute infiltrate, pleural effusion or pneumothorax. Bones demineralized. IMPRESSION: Bibasilar atelectasis. Electronically Signed   By: Ulyses Southward M.D.   On: 07/01/2015 16:09    Not all labs, radiology exams or other studies done during hospitalization come through on my EPIC note; however they are reviewed by me.    Assessment and Plan  Acute encephalopathy : Resolved, .MRI negative, EEG negative for seizure.  TSH low: free T 3 low at 1.7 and free T4 normal ammonia level 28  SNF - it appears pt had several fever spikes which resulted in starting antibiotics which were later d/c by ID; pt reported to be at baseline which is confused  PNA (pneumonia) Was dx with PNA and tx started which was later d/c by ID  Fungemia Patient had one out of 2 bottles growing he states with pseudohyphae Patient was started on IV Eraxis for fungemia. Infectious disease recommended to continue IV antifungal while in the hospital and no need of PICC line or prolonged antifungal therapy. Patient has received 4 days of therapy. No Diflucan recommended as patient has prolonged QT interval. Will discontinue IV antifungal Eraxis  and discharge patient on no therapy as per ID recommendation SNF - monitor for recurrence; no diflucan 2/2 QT prolongation  Leukocytosis Resolved Patient received IV antibiotics for 3 days. Chest x ray with bilateral infiltrates.  Lactic acid Normal.  UA growing yeast and Aerococcus Urinae.  As above ID recommends to discontinue vancomycin and cefepime  Pressure ulcer of hip SNF - wound care nurse will follow  HTN (hypertension) SNF - stable;cont lotrel; follow BMP  Prolonged Q-T interval on ECG SNF - mg level reported as nl;will avoid meds that  also prolong QT  H/O: CVA (cerebrovascular accident) SNF - cont ASA and lipitor  Diabetes mellitus type 2, uncontrolled (HCC) SNF - 6u lantus at bedtime and SSI q4; will follow BS ac and qHS ; Hb A1c was 5.5 , pt probobly does not need novolog  Seizures (HCC) SNF - cont with Vimpat 50 mg BID   Time spent > 45 min;> 50% of time with patient was spent reviewing records, labs, tests and studies, counseling and developing plan of care  [name], MD

## 2015-07-18 ENCOUNTER — Encounter: Payer: Self-pay | Admitting: Internal Medicine

## 2015-07-18 DIAGNOSIS — D72829 Elevated white blood cell count, unspecified: Secondary | ICD-10-CM | POA: Insufficient documentation

## 2015-07-18 DIAGNOSIS — B49 Unspecified mycosis: Secondary | ICD-10-CM | POA: Insufficient documentation

## 2015-07-18 DIAGNOSIS — R9431 Abnormal electrocardiogram [ECG] [EKG]: Secondary | ICD-10-CM | POA: Insufficient documentation

## 2015-07-18 NOTE — Assessment & Plan Note (Signed)
SNF - 6u lantus at bedtime and SSI q4; will follow BS ac and qHS ; Hb A1c was 5.5 , pt probobly does not need novolog

## 2015-07-18 NOTE — Assessment & Plan Note (Signed)
SNF - cont with Vimpat 50 mg BID

## 2015-07-18 NOTE — Assessment & Plan Note (Signed)
Was dx with PNA and tx started which was later d/c by ID

## 2015-07-18 NOTE — Assessment & Plan Note (Signed)
SNF - wound care nurse will follow

## 2015-07-18 NOTE — Assessment & Plan Note (Signed)
SNF - cont ASA and lipitor

## 2015-07-18 NOTE — Assessment & Plan Note (Signed)
SNF - mg level reported as nl;will avoid meds that also prolong QT

## 2015-07-18 NOTE — Assessment & Plan Note (Signed)
:   Resolved, .MRI negative, EEG negative for seizure.  TSH low: free T 3 low at 1.7 and free T4 normal ammonia level 28  SNF - it appears pt had several fever spikes which resulted in starting antibiotics which were later d/c by ID; pt reported to be at baseline which is confused

## 2015-07-18 NOTE — Assessment & Plan Note (Signed)
Resolved Patient received IV antibiotics for 3 days. Chest x ray with bilateral infiltrates.  Lactic acid Normal.  UA growing yeast and Aerococcus Urinae.  As above ID recommends to discontinue vancomycin and cefepime

## 2015-07-18 NOTE — Assessment & Plan Note (Signed)
SNF - stable;cont lotrel; follow BMP

## 2015-07-18 NOTE — Assessment & Plan Note (Signed)
Patient had one out of 2 bottles growing he states with pseudohyphae Patient was started on IV Eraxis for fungemia. Infectious disease recommended to continue IV antifungal while in the hospital and no need of PICC line or prolonged antifungal therapy. Patient has received 4 days of therapy. No Diflucan recommended as patient has prolonged QT interval. Will discontinue IV antifungal Eraxis and discharge patient on no therapy as per ID recommendation SNF - monitor for recurrence; no diflucan 2/2 QT prolongation

## 2015-08-10 ENCOUNTER — Non-Acute Institutional Stay (SKILLED_NURSING_FACILITY): Payer: Medicaid Other | Admitting: Internal Medicine

## 2015-08-10 ENCOUNTER — Encounter: Payer: Self-pay | Admitting: Internal Medicine

## 2015-08-10 DIAGNOSIS — R569 Unspecified convulsions: Secondary | ICD-10-CM

## 2015-08-10 DIAGNOSIS — I10 Essential (primary) hypertension: Secondary | ICD-10-CM | POA: Diagnosis not present

## 2015-08-10 DIAGNOSIS — Z8673 Personal history of transient ischemic attack (TIA), and cerebral infarction without residual deficits: Secondary | ICD-10-CM | POA: Diagnosis not present

## 2015-08-10 NOTE — Progress Notes (Signed)
MRN: [id number] Name: Eric Cameron  Sex: male Age: 81 y.o. DOB: Aug 01, 1934  [facility] Level Of Care: SNF Provider:Marvin Maenza, Thurston HoleAnne MDEmergency Contacts: Extended Emergency Contact Information Primary Emergency Contact: [name],[name] Address[address]          [place] [id number] [place] Home Phone: [phone] Relation: Sister  Code Status: FullCode  Allergies: Review of patient's allergies indicates no known allergies.  Chief Complaint  Patient presents with  . Medical Management of Chronic Issues    HPI: Patient is 81 y.o. male with  Alzheimer's dementia, DM, lives at hme with sister who [facility] where he was dx with leukocytosis and exhibited acute encephalopathy and felt to have a UTI and PNA. Treatment was started with IV antibiotics which were stopped by ID. Subsequently was diagnosed with fungemia which was treated full course in the hospital. Pt is admitted to SNF for generalized weakness and is being seen for routine issues of HTN, h/o CVA and seizures.  Past Medical History  Diagnosis Date  . Diabetes mellitus without complication (HCC)   . Hypertension   . Alzheimer disease   . Seizures (HCC)   . Acute encephalopathy   . Dementia   . Elevated troponin   . Dehydration   . Pressure ulcer   . PNA (pneumonia)     Past Surgical History  Procedure Laterality Date  . No past surgeries        Medication List       This list is accurate as of: 08/10/15 11:59 PM.  Always use your most recent med list.               amLODipine-benazepril 5-10 MG capsule  Commonly known as:  LOTREL  Take 1 capsule by mouth daily.     aspirin 325 MG tablet  Take 1 tablet (325 mg total) by mouth daily.     atorvastatin 10 MG tablet  Commonly known as:  LIPITOR  Take 10 mg by mouth daily at 6 PM.     citalopram 10 MG tablet  Commonly known as:  CELEXA  Take 10 mg by mouth daily.     folic acid  1 MG tablet  Commonly known as:  FOLVITE  Take 1 tablet (1 mg total) by mouth daily.     insulin aspart 100 UNIT/ML injection  Commonly known as:  novoLOG  Inject 0-9 Units into the skin every 4 (four) hours.     insulin glargine 100 UNIT/ML injection  Commonly known as:  LANTUS  Inject 0.06 mLs (6 Units total) into the skin at bedtime.     lacosamide 50 MG Tabs tablet  Commonly known as:  VIMPAT  Take 1 tablet (50 mg total) by mouth 2 (two) times daily.     memantine 10 MG tablet  Commonly known as:  NAMENDA  Take 10 mg by mouth 2 (two) times daily.     thiamine 100 MG tablet  Take 1 tablet (100 mg total) by mouth daily.        No orders of the defined types were placed in this encounter.    Immunization History  Administered Date(s) Administered  . Influenza,inj,Quad PF,36+ Mos 07/07/2015    Social History  Substance Use Topics  . Smoking status: Former Smoker    Quit date: 07/07/2010  . Smokeless tobacco: Never Used  . Alcohol Use: No    Review of Systems  UTO 2/2 dementia; nursing without concerns  Filed Vitals:   08/10/15 1512  BP: 118/78  Pulse: 72  Temp: 98.6 F (37 C)  Resp: 20    Physical Exam  GENERAL APPEARANCE: Alert,  No acute distress  SKIN: No diaphoresis rash HEENT: Unremarkable RESPIRATORY: Breathing is even, unlabored. Lung sounds are clear   CARDIOVASCULAR: Heart RRR no murmurs, rubs or gallops. No peripheral edema  GASTROINTESTINAL: Abdomen is soft, non-tender, not distended w/ normal bowel sounds.  GENITOURINARY: Bladder non tender, not distended  MUSCULOSKELETAL: No abnormal joints or musculature NEUROLOGIC: Cranial nerves 2-12 grossly intact. LE weakness PSYCHIATRIC: dementia, no behavioral issues  Patient Active Problem List   Diagnosis Date Noted  . Fungemia 07/18/2015  . Leukocytosis 07/18/2015  . Prolonged Q-T interval on ECG 07/18/2015  . Pressure ulcer of hip 07/06/2015  . PNA (pneumonia) 07/06/2015  .  Elevated troponin 07/01/2015  . Dehydration 07/01/2015  . Seizures (HCC) 12/03/2013  . H/O: CVA (cerebrovascular accident) 12/01/2013  . Altered mental status 11/30/2013  . Acute encephalopathy 11/30/2013  . HTN (hypertension) 11/30/2013  . Diabetes mellitus type 2, uncontrolled (HCC) 11/30/2013  . Dementia 11/30/2013  . HLD (hyperlipidemia) 11/30/2013    CBC    Component Value Date/Time   WBC 9.9 07/08/2015 0420   RBC 3.47* 07/08/2015 0420   HGB 10.1* 07/08/2015 0420   HCT 31.1* 07/08/2015 0420   PLT 258 07/08/2015 0420   MCV 89.6 07/08/2015 0420   LYMPHSABS 1.8 11/30/2013 2251   MONOABS 0.7 11/30/2013 2251   EOSABS 0.0 11/30/2013 2251   BASOSABS 0.0 11/30/2013 2251    CMP     Component Value Date/Time   NA 144 07/09/2015 0430   K 4.4 07/09/2015 0430   CL 107 07/09/2015 0430   CO2 25 07/09/2015 0430   GLUCOSE 122* 07/09/2015 0430   BUN 9 07/09/2015 0430   CREATININE 0.44* 07/09/2015 0430   CALCIUM 8.6* 07/09/2015 0430   PROT 6.6 07/02/2015 0405   ALBUMIN 3.1* 07/02/2015 0405   AST 31 07/02/2015 0405   ALT 16* 07/02/2015 0405   ALKPHOS 45 07/02/2015 0405   BILITOT 0.8 07/02/2015 0405   GFRNONAA >60 07/09/2015 0430   GFRAA >60 07/09/2015 0430    Assessment and Plan  HTN (hypertension) Controlled; cont lotrel 5-10 daily  H/O: CVA (cerebrovascular accident) Stable; cont lipitor and ASA  Seizures (HCC) No seizures known since admit to SNF ;cont vimpat 50 mg BID    [name]  MD

## 2015-08-11 ENCOUNTER — Encounter: Payer: Self-pay | Admitting: Adult Health

## 2015-08-11 ENCOUNTER — Non-Acute Institutional Stay (SKILLED_NURSING_FACILITY): Payer: Medicaid Other | Admitting: Adult Health

## 2015-08-11 DIAGNOSIS — I10 Essential (primary) hypertension: Secondary | ICD-10-CM

## 2015-08-11 DIAGNOSIS — Z8673 Personal history of transient ischemic attack (TIA), and cerebral infarction without residual deficits: Secondary | ICD-10-CM

## 2015-08-11 DIAGNOSIS — F0390 Unspecified dementia without behavioral disturbance: Secondary | ICD-10-CM

## 2015-08-11 DIAGNOSIS — R569 Unspecified convulsions: Secondary | ICD-10-CM

## 2015-08-11 MED ORDER — LACOSAMIDE 50 MG PO TABS: 50.0000 mg | ORAL_TABLET | Freq: Two times a day (BID) | ORAL | Status: DC

## 2015-08-11 NOTE — Progress Notes (Signed)
Patient ID: Eric Cameron, male   DOB: [birth year], 81 y.o.   MRN: [id number]    Facility:  [facility]       No Known Allergies  Chief Complaint  Patient presents with  . Discharge Note    Discharge from facility    HPI:  He is being discharged to home health for pt/ot/nrusing. He will need a 3:1 commode. He will need his prescriptions to be written for him and he will need to follow up with his pcp. He is unable to participate with the hpi or ros.  He had been hospitalized for pneumonia and uti. He was admitted to this facility for short term rehab and is now ready for discharge to home.   Past Medical History  Diagnosis Date  . Diabetes mellitus without complication (HCC)   . Hypertension   . Alzheimer disease   . Seizures (HCC)   . Acute encephalopathy   . Dementia   . Elevated troponin   . Dehydration   . Pressure ulcer   . PNA (pneumonia)     Past Surgical History  Procedure Laterality Date  . No past surgeries       Filed Vitals:   08/11/15 1202  BP: 119/78  Pulse: 72  Height: 5\' 8"  (1.727 m)  Weight: 127 lb 6.4 oz (57.788 kg)  SpO2: 97%     Patient's Medications  New Prescriptions   No medications on file  Previous Medications   AMLODIPINE-BENAZEPRIL (LOTREL) 5-10 MG PER CAPSULE    Take 1 capsule by mouth daily.    ASPIRIN 325 MG TABLET    Take 1 tablet (325 mg total) by mouth daily.   ATORVASTATIN (LIPITOR) 10 MG TABLET    Take 10 mg by mouth daily at 6 PM.   CITALOPRAM (CELEXA) 10 MG TABLET    Take 10 mg by mouth daily.    FOLIC ACID (FOLVITE) 1 MG TABLET    Take 1 tablet (1 mg total) by mouth daily.   INSULIN ASPART (NOVOLOG) 100 UNIT/ML INJECTION    3 units with meals for cbg >=150    INSULIN GLARGINE (LANTUS) 100 UNIT/ML INJECTION    Inject 0.06 mLs (6 Units total) into the skin at bedtime.   LACOSAMIDE (VIMPAT) 50 MG TABS TABLET    Take 1 tablet (50 mg total) by mouth 2 (two) times daily.   MEMANTINE (NAMENDA) 10 MG TABLET    Take 10 mg by  mouth 2 (two) times daily.    THIAMINE 100 MG TABLET    Take 1 tablet (100 mg total) by mouth daily.  Modified Medications   No medications on file  Discontinued Medications   No medications on file     SIGNIFICANT DIAGNOSTIC EXAMS   Review of Systems  Unable to perform ROS: dementia    Physical Exam  Constitutional: No distress.  Eyes: Conjunctivae are normal.  Neck: Neck supple. No JVD present. No thyromegaly present.  Cardiovascular: Normal rate, regular rhythm and intact distal pulses.   Respiratory: Effort normal and breath sounds normal. No respiratory distress. He has no wheezes.  GI: Soft. Bowel sounds are normal. He exhibits no distension. There is no tenderness.  Musculoskeletal: He exhibits no edema.  Able to move all extremities   Lymphadenopathy:    He has no cervical adenopathy.  Neurological: He is alert.  Skin: Skin is warm and dry. He is not diaphoretic.  Psychiatric: He has a normal mood and affect.      ASSESSMENT/  PLAN:  Will discharge to home with home health for for pt/ot/rn to evaluate and treat as indicated for gait; strength; balance and medication management. He will need a 3: 1 commode. His prescriptions have been written for a 30 day supply of his medications with #60 vaimpat 50 mg tabs. The facility is to setup his medical follow up for 1-2 weeks post discharge.     Time spent with patient 40  minutes >50% time spent counseling; reviewing medical record; tests; labs; and developing future plan of care   Synthia Innocent NP [facility]  Contact [phone] Monday through Friday 8am- 5pm  After hours call [phone]

## 2015-08-15 NOTE — Assessment & Plan Note (Signed)
No seizures known since admit to SNF ;cont vimpat 50 mg BID

## 2015-08-15 NOTE — Assessment & Plan Note (Signed)
Stable; cont lipitor and ASA

## 2015-08-15 NOTE — Assessment & Plan Note (Signed)
Controlled; cont lotrel 5-10 daily

## 2015-09-09 ENCOUNTER — Ambulatory Visit (INDEPENDENT_AMBULATORY_CARE_PROVIDER_SITE_OTHER): Payer: Medicaid Other | Admitting: Podiatry

## 2015-09-09 ENCOUNTER — Encounter: Payer: Self-pay | Admitting: Podiatry

## 2015-09-09 DIAGNOSIS — B351 Tinea unguium: Secondary | ICD-10-CM

## 2015-09-09 DIAGNOSIS — M79675 Pain in left toe(s): Secondary | ICD-10-CM | POA: Diagnosis not present

## 2015-09-09 DIAGNOSIS — M79674 Pain in right toe(s): Secondary | ICD-10-CM | POA: Diagnosis not present

## 2015-09-09 NOTE — Patient Instructions (Signed)
Diabetes and Foot Care Diabetes may cause you to have problems because of poor blood supply (circulation) to your feet and legs. This may cause the skin on your feet to become thinner, break easier, and heal more slowly. Your skin may become dry, and the skin may peel and crack. You may also have nerve damage in your legs and feet causing decreased feeling in them. You may not notice minor injuries to your feet that could lead to infections or more serious problems. Taking care of your feet is one of the most important things you can do for yourself.  HOME CARE INSTRUCTIONS  Wear shoes at all times, even in the house. Do not go barefoot. Bare feet are easily injured.  Check your feet daily for blisters, cuts, and redness. If you cannot see the bottom of your feet, use a mirror or ask someone for help.  Wash your feet with warm water (do not use hot water) and mild soap. Then pat your feet and the areas between your toes until they are completely dry. Do not soak your feet as this can dry your skin.  Apply a moisturizing lotion or petroleum jelly (that does not contain alcohol and is unscented) to the skin on your feet and to dry, brittle toenails. Do not apply lotion between your toes.  Trim your toenails straight across. Do not dig under them or around the cuticle. File the edges of your nails with an emery board or nail file.  Do not cut corns or calluses or try to remove them with medicine.  Wear clean socks or stockings every day. Make sure they are not too tight. Do not wear knee-high stockings since they may decrease blood flow to your legs.  Wear shoes that fit properly and have enough cushioning. To break in new shoes, wear them for just a few hours a day. This prevents you from injuring your feet. Always look in your shoes before you put them on to be sure there are no objects inside.  Do not cross your legs. This may decrease the blood flow to your feet.  If you find a minor scrape,  cut, or break in the skin on your feet, keep it and the skin around it clean and dry. These areas may be cleansed with mild soap and water. Do not cleanse the area with peroxide, alcohol, or iodine.  When you remove an adhesive bandage, be sure not to damage the skin around it.  If you have a wound, look at it several times a day to make sure it is healing.  Do not use heating pads or hot water bottles. They may burn your skin. If you have lost feeling in your feet or legs, you may not know it is happening until it is too late.  Make sure your health care provider performs a complete foot exam at least annually or more often if you have foot problems. Report any cuts, sores, or bruises to your health care provider immediately. SEEK MEDICAL CARE IF:   You have an injury that is not healing.  You have cuts or breaks in the skin.  You have an ingrown nail.  You notice redness on your legs or feet.  You feel burning or tingling in your legs or feet.  You have pain or cramps in your legs and feet.  Your legs or feet are numb.  Your feet always feel cold. SEEK IMMEDIATE MEDICAL CARE IF:   There is increasing redness,   swelling, or pain in or around a wound.  There is a red line that goes up your leg.  Pus is coming from a wound.  You develop a fever or as directed by your health care provider.  You notice a bad smell coming from an ulcer or wound.   This information is not intended to replace advice given to you by your health care provider. Make sure you discuss any questions you have with your health care provider.   Document Released: 04/01/2000 Document Revised: 12/05/2012 Document Reviewed: 09/11/2012 Elsevier Interactive Patient Education 2016 [employer]  

## 2015-09-09 NOTE — Progress Notes (Signed)
Patient ID: Eric CopaRobert Brach, male   DOB: [birth year], 81 y.o.   MRN: [id number]   Subjective This patient presents today with his sister in the treatment. Patient sister is requesting debridement of extremely elongated thickened toenails that she says her brother complains about one he walks and wear shoes. The toes are becoming progressively more uncomfortable over time is a nails thickened and elongate. They've attempted to trim the toenails themselves, however, were unable to do so and are requesting nail debridement. The patient sister and patient deny any history of foot ulceration, claudication or amputation. The patient lives with his sister   Objective  Patient is confused and has difficulty responding to questioning. Patient sister is able to answer questions  Vascular: No peripheral edema bilaterally DP pulses 2/4 bilaterally PT pulses 0/4 bilaterally Capillary reflex immediate bilaterally  Neurological: Sensation to 10 g monofilament wire of 0/5 bilaterally bilaterally (patient confused and have difficulty responding) Vibratory sensation nonreactive bilaterally (patient is confused and has difficulty responding) Ankle reflex equal and reactive bilaterally  Dermatological: Dry skin bilaterally No open skin lesions bilaterally The toenails are extremely elongated, incurvated, brittle, discolored, hypertrophic and tender to direct palpation 6-10  Musculoskeletal: HAV deformity left Hammertoe deformities 2-4 bilaterally There is no restriction ankle, subtalar or midtarsal joints bilaterally  Assessment: Type II diabetic Decrease pedal pulses suggestive of possible peripheral arterial disease Difficulty in evaluation. 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

## 2015-09-22 ENCOUNTER — Other Ambulatory Visit: Payer: Self-pay | Admitting: Adult Health

## 2015-10-03 ENCOUNTER — Other Ambulatory Visit: Payer: Self-pay | Admitting: Adult Health

## 2015-12-16 ENCOUNTER — Ambulatory Visit (INDEPENDENT_AMBULATORY_CARE_PROVIDER_SITE_OTHER): Payer: Medicaid Other | Admitting: Podiatry

## 2015-12-16 NOTE — Progress Notes (Signed)
No show

## 2015-12-17 NOTE — Progress Notes (Signed)
Patient ID: Eric CopaRobert Cameron, male   DOB: November 06, 1934, 81 y.o.   MRN: [id number]   No show erroneous encounter

## 2016-04-05 ENCOUNTER — Ambulatory Visit (INDEPENDENT_AMBULATORY_CARE_PROVIDER_SITE_OTHER): Payer: Medicaid Other | Admitting: Neurology

## 2016-04-05 ENCOUNTER — Encounter: Payer: Self-pay | Admitting: Neurology

## 2016-04-05 VITALS — BP 119/74 | HR 81 | Ht 68.0 in | Wt 121.0 lb

## 2016-04-05 DIAGNOSIS — G301 Alzheimer's disease with late onset: Secondary | ICD-10-CM | POA: Diagnosis not present

## 2016-04-05 DIAGNOSIS — F0280 Dementia in other diseases classified elsewhere without behavioral disturbance: Secondary | ICD-10-CM

## 2016-04-05 DIAGNOSIS — E538 Deficiency of other specified B group vitamins: Secondary | ICD-10-CM

## 2016-04-05 DIAGNOSIS — F0390 Unspecified dementia without behavioral disturbance: Secondary | ICD-10-CM

## 2016-04-05 NOTE — Patient Instructions (Signed)
Remember to drink plenty of fluid, eat healthy meals and do not skip any meals. Try to eat protein with a every meal and eat a healthy snack such as fruit or nuts in between meals. Try to keep a regular sleep-wake schedule and try to exercise daily, particularly in the form of walking, 20-30 minutes a day, if you can.   As far as your medications are concerned, I would like to suggest: May suggest Aricept(Donepezil) if he has never tried it before. Will send a message to primary care doctor  As far as diagnostic testing: Labs  I would like to see you back in 6 months, sooner if we need to. Please call us with any interim questions, concerns, problems, updates or refill requests.   Our phone number is [phone]. We also have an after hours call service for urgent matters and there is a physician on-call for urgent questions. For any emergencies you know to call 911 or go to the nearest emergency room

## 2016-04-05 NOTE — Progress Notes (Signed)
[facility]    Provider:  Dr Lucia GaskinsAhern Referring Provider: Maxcine HamSmothers, Deborah Primary Care Physician:  Maxcine HamSmothers, Deborah  CC:  dementia  HPI:  Cornelia CopaRobert Desilets is a 81 y.o. male here as a referral from Dr. Lyn HollingsheadAlexander for Alzheimer's , CKD, dementia.PMHx HTN, diabetes, hld, dementia.  Sister is here and provides most information. Memory changes started 6 years ago. It has been slowly progressive. He doesn't remember what year or day it is anymore. Patient lives with sister since 2009. Mother had dementia. He was on donepezil in the past unclear why he was taken off. Started with short term memory loss and has progressed. His remote memory is better but his recent memory is significantly impaired. No behavior issues. Sister and brother are home with him all day. He wanders at night and is worse at night, better in the morning, he sleeps well otherwise. He gets up in the middle of the night but he does not do anything unsafe. No mood problems. He is not driving. He is safe at home. She denies any seizures. No alcohol use. No hallucinations or delusions. No other associated symptoms, modifiable factors or inciting events, no hx of head trauma or alcohol or drug use. Slowly progressive memory changes, difficulty with executive functioning, no behavioral issues. Sister denies seizures or alteration of consciousness. He can perform most of his ADLs with prompting and assistance but not his IADLs, he can dress himself and feed himself and transfer, toilet, shower with assistance and prompting. No significant mood problems. Sister is his primary caregiver. He still has a good appetite despite some recent minimal weight loss. He is incontinent.  Reviewed notes, labs and imaging from outside physicians, which showed:   hgba1c 5.3, ldl 126,   Personally reviewed images and agree with the following: 1. No acute intracranial process identified. 2. Mild age-related cerebral atrophy with chronic small  vessel ischemic disease.  Per pcp notes: patient is falling more, eating dog treats, declining, they live in a one-level home, his pcp has helped with strategies to avoid falling, they check fasting glucose at home, try to avoid hypotension. He has wandered outside in his pajamas. He is incontinent. He has fallen in the middle of the night.   Review of Systems: Patient complains of symptoms per HPI as well as the following symptoms: memory loss, confusion, incontinence, weight loss. Pertinent negatives per HPI. All others negative.   Social History   Social History  . Marital status: Single    Spouse name: N/A  . Number of children: 0  . Years of education: 4   Occupational History  . retired    Social History Main Topics  . Smoking status: Former Smoker    Quit date: 07/07/2010  . Smokeless tobacco: Never Used  . Alcohol use No  . Drug use: No  . Sexual activity: Not on file   Other Topics Concern  . Not on file   Social History Narrative   Lives with sister and brother   Caffeine use: Tea    Very little coffee   Soda sometimes    Family History  Problem Relation Age of Onset  . Dementia Mother   . Colon cancer Father   . Diabetes Mellitus II Brother     Past Medical History:  Diagnosis Date  . Acute encephalopathy   . Alzheimer disease   . Dehydration   . Dementia   . Depression   . Diabetes mellitus without complication (HCC)   . Elevated  troponin   . Hypertension   . PNA (pneumonia)   . Pressure ulcer   . Seizures (HCC)     Past Surgical History:  Procedure Laterality Date  . NO PAST SURGERIES      Current Outpatient Prescriptions  Medication Sig Dispense Refill  . aspirin 325 MG tablet Take 1 tablet (325 mg total) by mouth daily.    Marland Kitchen atorvastatin (LIPITOR) 10 MG tablet Take 20 mg by mouth daily at 6 PM.     . citalopram (CELEXA) 10 MG tablet Take 10 mg by mouth daily.     . folic acid (FOLVITE) 1 MG tablet Take 1 tablet (1 mg total) by mouth  daily.    Marland Kitchen lisinopril (PRINIVIL,ZESTRIL) 2.5 MG tablet Take 2.5 mg by mouth daily.    . memantine (NAMENDA) 10 MG tablet Take 10 mg by mouth 2 (two) times daily.     Marland Kitchen thiamine 100 MG tablet Take 1 tablet (100 mg total) by mouth daily.    Marland Kitchen amLODipine-benazepril (LOTREL) 5-10 MG per capsule Take 1 capsule by mouth daily.      No current facility-administered medications for this visit.     Allergies as of 04/05/2016  . (No Known Allergies)    Vitals: BP 119/74 (BP Location: Right Arm, Patient Position: Sitting, Cuff Size: Normal)   Pulse 81   Ht 5\' 8"  (1.727 m)   Wt 121 lb (54.9 kg)   BMI 18.40 kg/m  Last Weight:  Wt Readings from Last 1 Encounters:  04/05/16 121 lb (54.9 kg)   Last Height:   Ht Readings from Last 1 Encounters:  04/05/16 5\' 8"  (1.727 m)   Physical exam: Exam: Gen: NAD, poor dentition, mild smell of urine                 CV: RRR, no MRG. No Carotid Bruits. No peripheral edema, warm, nontender Eyes: Conjunctivae clear without exudates or hemorrhage  Neuro: Detailed Neurologic Exam  Speech:    Speech is without aphasia or dysarthria.  Cognition:  MMSE - Mini Mental State Exam 04/05/2016  Orientation to time 0  Orientation to Place 1  Registration 3  Attention/ Calculation 0  Recall 0  Language- name 2 objects 2  Language- repeat 0  Language- follow 3 step command 2  Language- read & follow direction 0  Write a sentence 0  Copy design 0  Total score 8      The patient is oriented to person only    recent and remote memory impaired;     language fluent;     Impaired attention, concentration,    fund of knowledge Cranial Nerves:    The pupils are equal, round, and reactive to light. Attempted fundoscopic exam could not visualize. Visual fields are full to finger confrontation. Extraocular movements are intact. Trigeminal sensation is intact and the muscles of mastication are normal. The face is symmetric. The palate elevates in the midline.  Hearing intact. Voice is normal. Shoulder shrug is normal. The tongue has normal motion without fasciculations.   Coordination:    Normal finger to nose and heel to shin.  Gait:    No ataxia  Motor Observation:    No asymmetry, no atrophy, and no involuntary movements noted. Tone:    Normal muscle tone.    Posture:    Posture is normal. normal erect    Strength:    Strength is symmetric in the upper and lower limbs without apparent deficits  Sensation: intact to LT     Reflex Exam:  DTR's:    Deep tendon reflexes in the upper and lower extremities are symmetrical bilaterally.   Toes:    The toes are equivocal bilaterally.   Clonus:    Clonus is absent.    Assessment/Plan:   Cornelia CopaRobert Ton is a 81 y.o. male here as a referral from Dr. Lyn HollingsheadAlexander for Alzheimer's , CKD, dementia.PMHx HTN, diabetes, hld, dementia.  Sister is here and provides most information. Memory changes started 6 years ago. It has been slowly progressive. He doesn't remember what year or day it is anymore. Patient lives with sister since 2009. Mother had dementia. MMSE 8/30.   Today's history and physical demonstrated very substantial and measurable cognitive losses consistent with dementia. Based on the substantial degree of impairment it is clear that he does not have the capacity to make informed and appropriate decisions on his healthcare and finances.He lives with brother and sister. I do recommend that he lives in a setting with continuous monitoring. Patient is suffering from substantial cognitive impairment due to dementia. Discussed with his sister. Provided information on dementia and info on organizations such as the Alzheimer's foundation. Discussed fall precautions, taking up all loose rugs in the home for example, keeping lights on at night, keeping front doors locked so her does not wander outside the home.   - She does not know why he is not on Aricept/Donepezil if he had a reaction in the past.  Will send note to pcp to start this medication if no contraindications - Continue Namenda - Check RPR and B12 today - Follow up 6 months  Cc: Smothers, Fritzi Mandeseborah   Antonia Ahern, MD  [facility] [address] [place], [place] [id number]  Phone [phone] Fax [phone]

## 2016-04-06 ENCOUNTER — Telehealth: Payer: Self-pay | Admitting: *Deleted

## 2016-04-06 LAB — B12 AND FOLATE PANEL: VITAMIN B 12: 1462 pg/mL — AB (ref 232–1245)

## 2016-04-06 LAB — RPR: RPR: NONREACTIVE

## 2016-04-06 NOTE — Telephone Encounter (Signed)
-----   Message from Anson FretAntonia B Ahern, MD sent at 04/06/2016  9:08 AM EST ----- Labs look fine thanks

## 2016-04-06 NOTE — Telephone Encounter (Signed)
Tried calling sister, [name] about lab results. Went to automated message stating person you are trying to call is unable to receive messages at this time. Will try again later.

## 2016-04-07 NOTE — Telephone Encounter (Signed)
LVM for Eric Cameron about lab results per AA,MD note. Gave [facility] phone number if she has further questions/concerns.

## 2016-04-07 NOTE — Telephone Encounter (Signed)
Tried calling sister again. Went straight to automated message, unable to LVM.

## 2016-04-10 ENCOUNTER — Encounter: Payer: Self-pay | Admitting: Neurology

## 2016-04-10 DIAGNOSIS — F0280 Dementia in other diseases classified elsewhere without behavioral disturbance: Secondary | ICD-10-CM | POA: Insufficient documentation

## 2016-04-10 DIAGNOSIS — G309 Alzheimer's disease, unspecified: Secondary | ICD-10-CM

## 2016-05-28 ENCOUNTER — Inpatient Hospital Stay (HOSPITAL_COMMUNITY): Payer: Medicare Other

## 2016-05-28 ENCOUNTER — Emergency Department (HOSPITAL_COMMUNITY): Payer: Medicare Other

## 2016-05-28 ENCOUNTER — Encounter (HOSPITAL_COMMUNITY): Payer: Self-pay | Admitting: Pharmacy Technician

## 2016-05-28 ENCOUNTER — Inpatient Hospital Stay (HOSPITAL_COMMUNITY)
Admission: EM | Admit: 2016-05-28 | Discharge: 2016-06-03 | DRG: 871 | Disposition: A | Discharge status: Skilled Nursing Facility | Payer: Medicare Other | Attending: Family Medicine | Admitting: Family Medicine

## 2016-05-28 DIAGNOSIS — F0280 Dementia in other diseases classified elsewhere without behavioral disturbance: Secondary | ICD-10-CM | POA: Diagnosis not present

## 2016-05-28 DIAGNOSIS — Z8673 Personal history of transient ischemic attack (TIA), and cerebral infarction without residual deficits: Secondary | ICD-10-CM | POA: Diagnosis not present

## 2016-05-28 DIAGNOSIS — Z800 Family history of malignant neoplasm of digestive organs: Secondary | ICD-10-CM

## 2016-05-28 DIAGNOSIS — Z87891 Personal history of nicotine dependence: Secondary | ICD-10-CM

## 2016-05-28 DIAGNOSIS — G40909 Epilepsy, unspecified, not intractable, without status epilepticus: Secondary | ICD-10-CM | POA: Diagnosis present

## 2016-05-28 DIAGNOSIS — E1165 Type 2 diabetes mellitus with hyperglycemia: Secondary | ICD-10-CM | POA: Diagnosis present

## 2016-05-28 DIAGNOSIS — Z833 Family history of diabetes mellitus: Secondary | ICD-10-CM

## 2016-05-28 DIAGNOSIS — R4182 Altered mental status, unspecified: Secondary | ICD-10-CM

## 2016-05-28 DIAGNOSIS — N3000 Acute cystitis without hematuria: Secondary | ICD-10-CM | POA: Diagnosis not present

## 2016-05-28 DIAGNOSIS — R569 Unspecified convulsions: Secondary | ICD-10-CM | POA: Diagnosis not present

## 2016-05-28 DIAGNOSIS — A419 Sepsis, unspecified organism: Principal | ICD-10-CM | POA: Diagnosis present

## 2016-05-28 DIAGNOSIS — E870 Hyperosmolality and hypernatremia: Secondary | ICD-10-CM | POA: Diagnosis present

## 2016-05-28 DIAGNOSIS — G9340 Encephalopathy, unspecified: Secondary | ICD-10-CM | POA: Diagnosis present

## 2016-05-28 DIAGNOSIS — I10 Essential (primary) hypertension: Secondary | ICD-10-CM | POA: Diagnosis present

## 2016-05-28 DIAGNOSIS — Z79899 Other long term (current) drug therapy: Secondary | ICD-10-CM

## 2016-05-28 DIAGNOSIS — N390 Urinary tract infection, site not specified: Secondary | ICD-10-CM | POA: Diagnosis present

## 2016-05-28 DIAGNOSIS — F0390 Unspecified dementia without behavioral disturbance: Secondary | ICD-10-CM | POA: Diagnosis present

## 2016-05-28 DIAGNOSIS — E876 Hypokalemia: Secondary | ICD-10-CM | POA: Diagnosis present

## 2016-05-28 DIAGNOSIS — Z66 Do not resuscitate: Secondary | ICD-10-CM | POA: Diagnosis present

## 2016-05-28 DIAGNOSIS — G309 Alzheimer's disease, unspecified: Secondary | ICD-10-CM | POA: Diagnosis present

## 2016-05-28 DIAGNOSIS — R748 Abnormal levels of other serum enzymes: Secondary | ICD-10-CM | POA: Diagnosis present

## 2016-05-28 DIAGNOSIS — E785 Hyperlipidemia, unspecified: Secondary | ICD-10-CM | POA: Diagnosis present

## 2016-05-28 DIAGNOSIS — Z7982 Long term (current) use of aspirin: Secondary | ICD-10-CM

## 2016-05-28 DIAGNOSIS — IMO0002 Reserved for concepts with insufficient information to code with codable children: Secondary | ICD-10-CM | POA: Diagnosis present

## 2016-05-28 LAB — COMPREHENSIVE METABOLIC PANEL
ALBUMIN: 3.1 g/dL — AB (ref 3.5–5.0)
ALK PHOS: 83 U/L (ref 38–126)
ALT: 47 U/L (ref 17–63)
ANION GAP: 13 (ref 5–15)
AST: 37 U/L (ref 15–41)
BUN: 17 mg/dL (ref 6–20)
CO2: 30 mmol/L (ref 22–32)
Calcium: 9.4 mg/dL (ref 8.9–10.3)
Chloride: 103 mmol/L (ref 101–111)
Creatinine, Ser: 0.8 mg/dL (ref 0.61–1.24)
GFR calc non Af Amer: >60 mL/min (ref >60)
GLUCOSE: 256 mg/dL — AB (ref 65–99)
Potassium: 3.3 mmol/L — ABNORMAL LOW (ref 3.5–5.1)
SODIUM: 146 mmol/L — AB (ref 135–145)
Total Bilirubin: 0.4 mg/dL (ref 0.3–1.2)
Total Protein: 8.2 g/dL — ABNORMAL HIGH (ref 6.5–8.1)

## 2016-05-28 LAB — CBC WITH DIFFERENTIAL/PLATELET
BASOS PCT: 0 %
Basophils Absolute: 0 10*3/uL (ref 0.0–0.1)
EOS ABS: 0 10*3/uL (ref 0.0–0.7)
Eosinophils Relative: 0 %
HCT: 35.1 % — ABNORMAL LOW (ref 39.0–52.0)
Hemoglobin: 11 g/dL — ABNORMAL LOW (ref 13.0–17.0)
LYMPHS ABS: 1.8 10*3/uL (ref 0.7–4.0)
Lymphocytes Relative: 15 %
MCH: 29.1 pg (ref 26.0–34.0)
MCHC: 31.3 g/dL (ref 30.0–36.0)
MCV: 92.9 fL (ref 78.0–100.0)
MONO ABS: 0.7 10*3/uL (ref 0.1–1.0)
MONOS PCT: 6 %
Neutro Abs: 10 10*3/uL — ABNORMAL HIGH (ref 1.7–7.7)
Neutrophils Relative %: 79 %
Platelets: 272 10*3/uL (ref 150–400)
RBC: 3.78 MIL/uL — ABNORMAL LOW (ref 4.22–5.81)
RDW: 14.2 % (ref 11.5–15.5)
WBC: 12.5 10*3/uL — ABNORMAL HIGH (ref 4.0–10.5)

## 2016-05-28 LAB — I-STAT CG4 LACTIC ACID, ED
LACTIC ACID, VENOUS: 2.95 mmol/L — AB (ref 0.5–1.9)
Lactic Acid, Venous: 1.96 mmol/L (ref 0.5–1.9)

## 2016-05-28 LAB — URINALYSIS, ROUTINE W REFLEX MICROSCOPIC
Bilirubin Urine: NEGATIVE
KETONES UR: NEGATIVE mg/dL
NITRITE: NEGATIVE
PH: 7 (ref 5.0–8.0)
PROTEIN: NEGATIVE mg/dL
Specific Gravity, Urine: 1.006 (ref 1.005–1.030)

## 2016-05-28 LAB — INFLUENZA PANEL BY PCR (TYPE A & B)
INFLBPCR: NEGATIVE
Influenza A By PCR: NEGATIVE

## 2016-05-28 MED ORDER — LORAZEPAM 2 MG/ML IJ SOLN
1.0000 mg | Freq: Once | INTRAMUSCULAR | Status: AC
  Administered 2016-05-28: 1 mg via INTRAVENOUS
  Filled 2016-05-28: qty 1

## 2016-05-28 MED ORDER — POTASSIUM CHLORIDE IN NACL 40-0.9 MEQ/L-% IV SOLN
INTRAVENOUS | Status: DC
  Administered 2016-05-29: 100 mL/h via INTRAVENOUS
  Administered 2016-05-30: 75 mL/h via INTRAVENOUS
  Filled 2016-05-28 (×3): qty 1000

## 2016-05-28 MED ORDER — PIPERACILLIN-TAZOBACTAM 3.375 G IVPB
3.3750 g | Freq: Three times a day (TID) | INTRAVENOUS | Status: DC
  Administered 2016-05-29 – 2016-05-31 (×8): 3.375 g via INTRAVENOUS
  Filled 2016-05-28 (×11): qty 50

## 2016-05-28 MED ORDER — ACETAMINOPHEN 650 MG RE SUPP
650.0000 mg | Freq: Four times a day (QID) | RECTAL | Status: DC | PRN
  Administered 2016-05-29: 650 mg via RECTAL
  Filled 2016-05-28: qty 1

## 2016-05-28 MED ORDER — ASPIRIN 300 MG RE SUPP
300.0000 mg | Freq: Every day | RECTAL | Status: DC
  Administered 2016-05-29 – 2016-05-31 (×3): 300 mg via RECTAL
  Filled 2016-05-28 (×3): qty 1

## 2016-05-28 MED ORDER — PIPERACILLIN-TAZOBACTAM 3.375 G IVPB 30 MIN
3.3750 g | Freq: Once | INTRAVENOUS | Status: AC
  Administered 2016-05-28: 3.375 g via INTRAVENOUS
  Filled 2016-05-28: qty 50

## 2016-05-28 MED ORDER — SODIUM CHLORIDE 0.9 % IV BOLUS (SEPSIS)
1000.0000 mL | Freq: Once | INTRAVENOUS | Status: AC
Start: 2016-05-28 — End: 2016-05-28
  Administered 2016-05-28: 1000 mL via INTRAVENOUS

## 2016-05-28 MED ORDER — ENOXAPARIN SODIUM 40 MG/0.4ML ~~LOC~~ SOLN: 40.0000 mg | Freq: Every day | SUBCUTANEOUS | Status: DC

## 2016-05-28 MED ORDER — ONDANSETRON HCL 4 MG PO TABS: 4.0000 mg | ORAL_TABLET | Freq: Four times a day (QID) | ORAL | Status: DC | PRN

## 2016-05-28 MED ORDER — ACETAMINOPHEN 650 MG RE SUPP
650.0000 mg | Freq: Once | RECTAL | Status: AC
  Administered 2016-05-28: 650 mg via RECTAL
  Filled 2016-05-28: qty 1

## 2016-05-28 MED ORDER — ACETAMINOPHEN 325 MG PO TABS: 650.0000 mg | ORAL_TABLET | Freq: Four times a day (QID) | ORAL | Status: DC | PRN

## 2016-05-28 MED ORDER — SODIUM CHLORIDE 0.9 % IV SOLN
500.0000 mg | Freq: Two times a day (BID) | INTRAVENOUS | Status: DC
  Administered 2016-05-29 – 2016-05-31 (×6): 500 mg via INTRAVENOUS
  Filled 2016-05-28 (×8): qty 5

## 2016-05-28 MED ORDER — SODIUM CHLORIDE 0.9% FLUSH
3.0000 mL | Freq: Two times a day (BID) | INTRAVENOUS | Status: DC
  Administered 2016-05-29 – 2016-06-03 (×7): 3 mL via INTRAVENOUS

## 2016-05-28 MED ORDER — VANCOMYCIN HCL 500 MG IV SOLR
500.0000 mg | Freq: Two times a day (BID) | INTRAVENOUS | Status: DC
  Administered 2016-05-29 – 2016-05-30 (×3): 500 mg via INTRAVENOUS
  Filled 2016-05-28 (×5): qty 500

## 2016-05-28 MED ORDER — LORAZEPAM 2 MG/ML IJ SOLN
0.5000 mg | Freq: Once | INTRAMUSCULAR | Status: AC
  Administered 2016-05-28: 0.5 mg via INTRAVENOUS
  Filled 2016-05-28: qty 1

## 2016-05-28 MED ORDER — SODIUM CHLORIDE 0.9 % IV BOLUS (SEPSIS)
1000.0000 mL | Freq: Once | INTRAVENOUS | Status: AC
  Administered 2016-05-28: 1000 mL via INTRAVENOUS

## 2016-05-28 MED ORDER — VANCOMYCIN HCL IN DEXTROSE 1-5 GM/200ML-% IV SOLN
1000.0000 mg | Freq: Once | INTRAVENOUS | Status: AC
  Administered 2016-05-28: 1000 mg via INTRAVENOUS
  Filled 2016-05-28: qty 200

## 2016-05-28 MED ORDER — VANCOMYCIN HCL IN DEXTROSE 1-5 GM/200ML-% IV SOLN: 1000.0000 mg | Freq: Once | INTRAVENOUS | Status: DC

## 2016-05-28 MED ORDER — ONDANSETRON HCL 4 MG/2ML IJ SOLN: 4.0000 mg | Freq: Four times a day (QID) | INTRAMUSCULAR | Status: DC | PRN

## 2016-05-28 MED ORDER — PIPERACILLIN-TAZOBACTAM 3.375 G IVPB 30 MIN: 3.3750 g | Freq: Once | INTRAVENOUS | Status: DC

## 2016-05-28 NOTE — ED Triage Notes (Signed)
Pt from home via EMS with reports of AMS. Last known well per EMS was 2300 yesterday. Pt normally responds to speech and family normally gets pt out of bed. Today pt is not responding to family and family was unable to get pt to get out of bed. EMS states CBG of 377, T 101.5, P 104, RR 16, BP 136/76 and 95% on RA. Pt given approx 400cc NS en route.

## 2016-05-28 NOTE — ED Notes (Signed)
Dr. Clarene DukeLittle notified of elevated GC-4

## 2016-05-28 NOTE — H&P (Signed)
History and Physical    Eric Cameron ZOX:[id number] DOB: Oct 19, 1934 DOA: 05/28/2016  PCP: [name], MD   Patient coming from: Home  Chief Complaint: Altered mental status  HPI: Eric Cameron is a 82 y.o. gentleman with a history of dementia, seizure (though he is not on any AEDs), HTN, and DM who lives at home with a brother and sister who take care of him.  The patient is obtunded and unable to give any meaningful history.  Details of HPI obtained from phone conversation with his sister.  At baseline, the patient can walk with assistance and is able to feed himself (eats 100% of meals when well) though he is total care with all ADLs because of his dementia.  He was in his baseline state of health when he went to bed last night.  This morning, he was noted to be more lethargic, less interactive.  He required more assistance than usual to get to the restroom.  He was noted to have a bloody mouth; etiology unclear.  There has not been any witnessed convulsions.  He has not had bowel/bladder incontinence.  Fever documented today.  No sick contacts in the home.  No nausea or vomiting.  ED Course: Tmax 103 (axillary); HR 104.  Code Sepsis called in the ED.  Lactic acid level 2.95.  WBC count 12.5.  Chest xray negative for acute process.  Flu screen negative.  U/A shows large leukocytes, TNTC WBC, rare bacteria.  The patient received 2L NS in the ED.  He has received vanc and zosyn.  He has received acetaminophen suppository.  Hospitalist asked to admit.    Review of Systems: Unable to obtain due to mental status changes.   Past Medical History:  Diagnosis Date  . Acute encephalopathy   . Alzheimer disease   . Dehydration   . Dementia   . Depression   . Diabetes mellitus without complication (HCC)   . Elevated troponin   . Hypertension   . PNA (pneumonia)   . Pressure ulcer   . Seizures (HCC)     Past Surgical History:  Procedure Laterality Date  . NO PAST SURGERIES       reports that he quit smoking about 5 years ago. He has never used smokeless tobacco. He reports that he does not drink alcohol or use drugs.  No Known Allergies  Family History  Problem Relation Age of Onset  . Dementia Mother   . Colon cancer Father   . Diabetes Mellitus II Brother      Prior to Admission medications   Medication Sig Start Date End Date Taking? Authorizing Provider  aspirin 325 MG tablet Take 1 tablet (325 mg total) by mouth daily. 12/03/13  Yes Maryann Mikhail, DO  atorvastatin (LIPITOR) 20 MG tablet Take 20 mg by mouth daily. 02/24/16  Yes Historical Provider, MD  Cholecalciferol (VITAMIN D PO) Take 1 tablet by mouth daily.   Yes Historical Provider, MD  citalopram (CELEXA) 10 MG tablet Take 10 mg by mouth daily after supper.  10/23/14  Yes Historical Provider, MD  folic acid (FOLVITE) 1 MG tablet Take 1 tablet (1 mg total) by mouth daily. 07/10/15  Yes Meredeth Ide, MD  lisinopril (PRINIVIL,ZESTRIL) 2.5 MG tablet Take 2.5 mg by mouth daily.   Yes Historical Provider, MD  memantine (NAMENDA) 10 MG tablet Take 10 mg by mouth 2 (two) times daily.  10/23/14  Yes Historical Provider, MD  thiamine 100 MG tablet Take 1 tablet (100 mg  total) by mouth daily. 07/10/15  Yes Meredeth IdeGagan S Lama, MD    Physical Exam: Vitals:   05/28/16 2200 05/28/16 2205 05/28/16 2215 05/28/16 2223  BP: 125/71  140/68   Pulse:    96  Resp: 22  19 18   Temp:  103 F (39.4 C)    TempSrc:  Axillary    SpO2:    95%      Constitutional: NAD, but he is unresponsive. Vitals:   05/28/16 2200 05/28/16 2205 05/28/16 2215 05/28/16 2223  BP: 125/71  140/68   Pulse:    96  Resp: 22  19 18   Temp:  103 F (39.4 C)    TempSrc:  Axillary    SpO2:    95%   Eyes: Resists my attempts to open his eyes but PERRL, lids and conjunctivae normal ENMT: Unable to open his mouth.  Lips are dry.  Neck: normal appearance, supple, no apparent masses Respiratory: clear to auscultation bilaterally, no wheezing, no  crackles. Normal respiratory effort. No accessory muscle use.  Cardiovascular: Normal rate, regular rhythm, no murmurs / rubs / gallops. No extremity edema. 2+ pedal pulses. GI: abdomen is soft and compressible.  No distention.  No apparent tenderness.  No masses Bowel sounds are present. Musculoskeletal:  No joint deformity in upper and lower extremities. Patient is drawn up in the fetal position at this time and unable to cooperate with exam.  Cannot assess for contractures.  Normal muscle tone.  Skin: no rashes, warm and dry, flaky.  No sacral breakdown. Neurologic: Unable to assess due to mental status changes. Psychiatric: Impaired at baseline due to history of dementia.  Flat affect.    Labs on Admission: I have personally reviewed following labs and imaging studies  CBC:  Recent Labs Lab 05/28/16 1827  WBC 12.5*  NEUTROABS 10.0*  HGB 11.0*  HCT 35.1*  MCV 92.9  PLT 272   Basic Metabolic Panel:  Recent Labs Lab 05/28/16 1827  NA 146*  K 3.3*  CL 103  CO2 30  GLUCOSE 256*  BUN 17  CREATININE 0.80  CALCIUM 9.4   GFR: CrCl cannot be calculated (Unknown ideal weight.). Liver Function Tests:  Recent Labs Lab 05/28/16 1827  AST 37  ALT 47  ALKPHOS 83  BILITOT 0.4  PROT 8.2*  ALBUMIN 3.1*   Urine analysis:    Component Value Date/Time   COLORURINE YELLOW 05/28/2016 1720   APPEARANCEUR CLOUDY (A) 05/28/2016 1720   LABSPEC 1.006 05/28/2016 1720   PHURINE 7.0 05/28/2016 1720   GLUCOSEU >=500 (A) 05/28/2016 1720   HGBUR SMALL (A) 05/28/2016 1720   BILIRUBINUR NEGATIVE 05/28/2016 1720   KETONESUR NEGATIVE 05/28/2016 1720   PROTEINUR NEGATIVE 05/28/2016 1720   UROBILINOGEN 1.0 11/30/2013 1734   NITRITE NEGATIVE 05/28/2016 1720   LEUKOCYTESUR LARGE (A) 05/28/2016 1720   Sepsis Labs:  Lactic acid level 2.95  Radiological Exams on Admission: Dg Chest Port 1 View  Result Date: 05/28/2016 CLINICAL DATA:  Acute mental status change.  Fever. EXAM: PORTABLE  CHEST 1 VIEW COMPARISON:  July 04, 2015 FINDINGS: The study is markedly limited due to positioning. The patient was unable/unwilling to cooperate. Within this limitation, no pneumothorax. The right lateral lower lung was not imaged. Within visualize limits, no pneumonia identified. The cardiomediastinal silhouette is unchanged given difference in positioning. IMPRESSION: Markedly limited study as above.  No acute abnormalities are seen. Electronically Signed   By: Gerome Samavid  Williams III M.D   On: 05/28/2016 18:15  EKG: Requested at time of admission.  Assessment/Plan Principal Problem:   Sepsis (HCC) Active Problems:   Acute encephalopathy   HTN (hypertension)   Diabetes mellitus type 2, uncontrolled (HCC)   Dementia   HLD (hyperlipidemia)   H/O: CVA (cerebrovascular accident)   Seizures (HCC)   UTI (urinary tract infection)      Acute encephalopathy attributed to sepsis at this point, urine is the suspected source. --Will continue vanc and zosyn for now due to persistent fevers --Blood and urine cultures are pending --Check procalcitonin level --Check head CT --Serial troponin --Cooling blanket, ice packs as needed --Acetaminophen suppositories for fever --NPO until mental status improves (holding oral meds).  Will give ASA suppository.  History of seizure, cannot rule out occult seizure activity --Empiric keppra for now(I discussed with patient's sister) --EEG in the AM --If mental status not improved with initial treatment of sepsis, will need to call neurology  Hypokalemia --Potassium added to maintenance fluids  Dementia --Namenda, folic acid, thiamine on hold    DVT prophylaxis: SCDs Code Status: DNR Family Communication: Spoke with patient's sister by phone. Disposition Plan: To be determined. Consults called: NONE Admission status: Inpatient, stepdown unit due to persistent altered mental status and fever.   TIME SPENT: 70 minutes   Eric Bears  MD [facility] Pager [phone]  If 7PM-7AM, please contact night-coverage [email or web address] Password [email or web address]  05/28/2016, 10:50 PM

## 2016-05-28 NOTE — ED Provider Notes (Signed)
[facility]-EMERGENCY DEPT Provider Note   CSN: [id number] Arrival date & time: 05/28/16 1709     History    Chief Complaint  Patient presents with  . Altered Mental Status     HPI Eric Cameron is a 82 y.o. male. 81yo M w/ PMH including dementia, HTN, seizures, T2DM who p/w Altered mental status. Patient brought in by EMS, who reports that the patient was last known normal at 34 PM yesterday according to family members. They stated that he is normally responsive and gets out of bed but today has been altered, not interactive, and unable to get out of bed. He was noted to be febrile at 101.5 by EMS, blood glucose 377. He was given 400 ML NS in route.  LEVEL 5 CAVEAT DUE TO AMS  Past Medical History:  Diagnosis Date  . Acute encephalopathy   . Alzheimer disease   . Dehydration   . Dementia   . Depression   . Diabetes mellitus without complication (HCC)   . Elevated troponin   . Hypertension   . PNA (pneumonia)   . Pressure ulcer   . Seizures [facility])      Patient Active Problem List   Diagnosis Date Noted  . Alzheimer's dementia without behavioral disturbance 04/10/2016  . Fungemia 07/18/2015  . Leukocytosis 07/18/2015  . Prolonged Q-T interval on ECG 07/18/2015  . Pressure ulcer of hip 07/06/2015  . PNA (pneumonia) 07/06/2015  . Elevated troponin 07/01/2015  . Dehydration 07/01/2015  . Seizures (HCC) 12/03/2013  . H/O: CVA (cerebrovascular accident) 12/01/2013  . Altered mental status 11/30/2013  . Acute encephalopathy 11/30/2013  . HTN (hypertension) 11/30/2013  . Diabetes mellitus type 2, uncontrolled (HCC) 11/30/2013  . Dementia 11/30/2013  . HLD (hyperlipidemia) 11/30/2013    Past Surgical History:  Procedure Laterality Date  . NO PAST SURGERIES          Home Medications    Prior to Admission medications   Medication Sig Start Date End Date Taking? Authorizing Provider  aspirin 325 MG tablet Take 1 tablet (325 mg total) by mouth daily. 12/03/13  Yes  Maryann Mikhail, DO  atorvastatin (LIPITOR) 20 MG tablet Take 20 mg by mouth daily. 02/24/16  Yes Historical Provider, MD  Cholecalciferol (VITAMIN D PO) Take 1 tablet by mouth daily.   Yes Historical Provider, MD  citalopram (CELEXA) 10 MG tablet Take 10 mg by mouth daily after supper.  10/23/14  Yes Historical Provider, MD  folic acid (FOLVITE) 1 MG tablet Take 1 tablet (1 mg total) by mouth daily. 07/10/15  Yes Meredeth Ide, MD  lisinopril (PRINIVIL,ZESTRIL) 2.5 MG tablet Take 2.5 mg by mouth daily.   Yes Historical Provider, MD  memantine (NAMENDA) 10 MG tablet Take 10 mg by mouth 2 (two) times daily.  10/23/14  Yes Historical Provider, MD  thiamine 100 MG tablet Take 1 tablet (100 mg total) by mouth daily. 07/10/15  Yes Meredeth Ide, MD      Family History  Problem Relation Age of Onset  . Dementia Mother   . Colon cancer Father   . Diabetes Mellitus II Brother      Social History  Substance Use Topics  . Smoking status: Former Smoker    Quit date: 07/07/2010  . Smokeless tobacco: Never Used  . Alcohol use No     Allergies     Patient has no known allergies.    Review of Systems  Unable to obtain ROS 2/2 dementia and AMS  Physical  Exam Updated Vital Signs BP 118/70   Pulse 100   Temp 102 F (38.9 C) (Axillary)   Resp 23   SpO2 96%   Physical Exam  Constitutional: No distress.  Thin, frail elderly man, non-verbal  HENT:  Head: Normocephalic and atraumatic.  Eyes: Conjunctivae are normal.  Neck: Neck supple.  Cardiovascular: Regular rhythm and normal heart sounds.  Tachycardia present.   No murmur heard. Pulmonary/Chest: Effort normal and breath sounds normal.  Abdominal: Soft. Bowel sounds are normal. He exhibits no distension. There is no tenderness.  Musculoskeletal: He exhibits no edema.  No skin breakdown on back or extremities  Neurological:  Disoriented, moving all 4 extremities  Skin: Skin is warm and dry. No rash noted.  Nursing note and vitals  reviewed.     ED Treatments / Results  Labs (all labs ordered are listed, but only abnormal results are displayed) Labs Reviewed  CBC WITH DIFFERENTIAL/PLATELET - Abnormal; Notable for the following:       Result Value   WBC 12.5 (*)    RBC 3.78 (*)    Hemoglobin 11.0 (*)    HCT 35.1 (*)    Neutro Abs 10.0 (*)    All other components within normal limits  URINALYSIS, ROUTINE W REFLEX MICROSCOPIC - Abnormal; Notable for the following:    APPearance CLOUDY (*)    Glucose, UA >=500 (*)    Hgb urine dipstick SMALL (*)    Leukocytes, UA LARGE (*)    Bacteria, UA RARE (*)    Squamous Epithelial / LPF 0-5 (*)    All other components within normal limits  COMPREHENSIVE METABOLIC PANEL - Abnormal; Notable for the following:    Sodium 146 (*)    Potassium 3.3 (*)    Glucose, Bld 256 (*)    Total Protein 8.2 (*)    Albumin 3.1 (*)    All other components within normal limits  I-STAT CG4 LACTIC ACID, ED - Abnormal; Notable for the following:    Lactic Acid, Venous 2.95 (*)    All other components within normal limits  I-STAT CG4 LACTIC ACID, ED - Abnormal; Notable for the following:    Lactic Acid, Venous 1.96 (*)    All other components within normal limits  CULTURE, BLOOD (ROUTINE X 2)  CULTURE, BLOOD (ROUTINE X 2)  INFLUENZA PANEL BY PCR (TYPE A & B)     EKG  EKG Interpretation  Date/Time:    Ventricular Rate:    PR Interval:    QRS Duration:   QT Interval:    QTC Calculation:   R Axis:     Text Interpretation:           Radiology Dg Chest Port 1 View  Result Date: 05/28/2016 CLINICAL DATA:  Acute mental status change.  Fever. EXAM: PORTABLE CHEST 1 VIEW COMPARISON:  July 04, 2015 FINDINGS: The study is markedly limited due to positioning. The patient was unable/unwilling to cooperate. Within this limitation, no pneumothorax. The right lateral lower lung was not imaged. Within visualize limits, no pneumonia identified. The cardiomediastinal silhouette is  unchanged given difference in positioning. IMPRESSION: Markedly limited study as above.  No acute abnormalities are seen. Electronically Signed   By: Gerome Sam III M.D   On: 05/28/2016 18:15    Procedures Procedures (including critical care time) .Critical Care Performed by: Laurence Spates Authorized by: Laurence Spates   Critical care provider statement:    Critical care time (minutes):  35  Critical care time was exclusive of:  Separately billable procedures and treating other patients   Critical care was necessary to treat or prevent imminent or life-threatening deterioration of the following conditions:  Sepsis   Critical care was time spent personally by me on the following activities:  Evaluation of patient's response to treatment, examination of patient, obtaining history from patient or surrogate, ordering and performing treatments and interventions, ordering and review of laboratory studies, ordering and review of radiographic studies, re-evaluation of patient's condition and review of old charts    Medications Ordered in ED  Medications  sodium chloride 0.9 % bolus 1,000 mL (not administered)  LORazepam (ATIVAN) injection 1 mg (not administered)  sodium chloride 0.9 % bolus 1,000 mL (0 mLs Intravenous Stopped 05/28/16 2014)  LORazepam (ATIVAN) injection 0.5 mg (0.5 mg Intravenous Given 05/28/16 1803)  acetaminophen (TYLENOL) suppository 650 mg (650 mg Rectal Given 05/28/16 2001)  piperacillin-tazobactam (ZOSYN) IVPB 3.375 g (0 g Intravenous Stopped 05/28/16 1930)  vancomycin (VANCOCIN) IVPB 1000 mg/200 mL premix (0 mg Intravenous Stopped 05/28/16 2001)     Initial Impression / Assessment and Plan / ED Course  I have reviewed the triage vital signs and the nursing notes.  Pertinent labs & imaging results that were available during my care of the patient were reviewed by me and considered in my medical decision making (see chart for details).    Pt brought  in by EMS for AMS, noted to be febrile. On arrival here, he was disoriented and non-verbal but NAD. He had a fever of 102, reassuring BP, HR 104. Normal O2 sat on RA. No abd tenderness or distention. Because of altered mental status and fever, initiated a code sepsis. Gave vancomycin, Zosyn, rectal Tylenol, and IV fluids.  Labwork notable for lactate of 2.95, WBC 12.5, hemoglobin 11, urine consistent with infection. Chest x-ray was limited due to patient's agitation but no obvious infiltrate. I suspect he may have sepsis from urinary source. His blood pressure has remained stable here. Lactate improved to 1.95 after 1L IVF.  Sepsis - Repeat Assessment  Performed at:    9:44pm  Vitals     Blood pressure 118/70, pulse 100, temperature 102 F (38.9 C), temperature source Axillary, resp. rate 23, SpO2 96 %.  Heart:     Regular rate and rhythm  Lungs:    CTA  Capillary Refill:   <2 sec  Peripheral Pulse:   Dorsalis pedis pulse  palpable  Skin:     Normal Color   Discussed admission with hospitalist, Dr. Montez Moritaarter, and pt admitted for further treatment.  Final Clinical Impressions(s) / ED Diagnoses   Final diagnoses:  None     New Prescriptions   No medications on file       Laurence Spatesachel Morgan Little, MD 05/28/16 2145

## 2016-05-28 NOTE — Progress Notes (Signed)
Pharmacy Antibiotic Note  Eric Cameron is a 82 y.o. male admitted on 05/28/2016 with sepsis.  Pharmacy has been consulted for vancomycin/zosyn dosing. Tmax 101.9, WBC 12.5, LA 2.95, SCr 0.8 on admit, CrCl~46.  Plan: Vancomycin 1g IV x1; then 500mg  IV q12h Zosyn 3.375g IV (30min inf) x1; then 3.375g IV q8h (4h inf) Monitor clinical progress, c/s, renal function, abx plan/LOT Vancomycin trough as indicated     Temp (24hrs), Avg:102 F (38.9 C), Min:101.9 F (38.8 C), Max:102 F (38.9 C)   Recent Labs Lab 05/28/16 1827 05/28/16 1839 05/28/16 2121  WBC 12.5*  --   --   CREATININE 0.80  --   --   LATICACIDVEN  --  2.95* 1.96*    CrCl cannot be calculated (Unknown ideal weight.).    No Known Allergies  Eric Cameron, PharmD, BCPS Clinical Pharmacist 05/28/2016 9:56 PM

## 2016-05-29 ENCOUNTER — Inpatient Hospital Stay (HOSPITAL_COMMUNITY): Payer: Medicare Other

## 2016-05-29 LAB — BASIC METABOLIC PANEL
ANION GAP: 12 (ref 5–15)
BUN: 13 mg/dL (ref 6–20)
CALCIUM: 8.5 mg/dL — AB (ref 8.9–10.3)
CO2: 27 mmol/L (ref 22–32)
CREATININE: 0.74 mg/dL (ref 0.61–1.24)
Chloride: 110 mmol/L (ref 101–111)
GLUCOSE: 196 mg/dL — AB (ref 65–99)
Potassium: 3.3 mmol/L — ABNORMAL LOW (ref 3.5–5.1)
Sodium: 149 mmol/L — ABNORMAL HIGH (ref 135–145)

## 2016-05-29 LAB — CBC WITH DIFFERENTIAL/PLATELET
BASOS ABS: 0 10*3/uL (ref 0.0–0.1)
BASOS PCT: 0 %
EOS ABS: 0 10*3/uL (ref 0.0–0.7)
Eosinophils Relative: 0 %
HEMATOCRIT: 31 % — AB (ref 39.0–52.0)
HEMOGLOBIN: 9.6 g/dL — AB (ref 13.0–17.0)
Lymphocytes Relative: 24 %
Lymphs Abs: 3 10*3/uL (ref 0.7–4.0)
MCH: 28.7 pg (ref 26.0–34.0)
MCHC: 31 g/dL (ref 30.0–36.0)
MCV: 92.5 fL (ref 78.0–100.0)
Monocytes Absolute: 1.3 10*3/uL — ABNORMAL HIGH (ref 0.1–1.0)
Monocytes Relative: 10 %
NEUTROS ABS: 8.1 10*3/uL — AB (ref 1.7–7.7)
NEUTROS PCT: 66 %
Platelets: 256 10*3/uL (ref 150–400)
RBC: 3.35 MIL/uL — ABNORMAL LOW (ref 4.22–5.81)
RDW: 14.5 % (ref 11.5–15.5)
WBC: 12.4 10*3/uL — AB (ref 4.0–10.5)

## 2016-05-29 LAB — LACTIC ACID, PLASMA
LACTIC ACID, VENOUS: 2.1 mmol/L — AB (ref 0.5–1.9)
LACTIC ACID, VENOUS: 2 mmol/L — AB (ref 0.5–1.9)

## 2016-05-29 LAB — TROPONIN I
TROPONIN I: 0.04 ng/mL — AB (ref <0.03)
TROPONIN I: 0.05 ng/mL — AB (ref <0.03)
TROPONIN I: 0.05 ng/mL — AB (ref <0.03)

## 2016-05-29 LAB — MAGNESIUM: MAGNESIUM: 1.5 mg/dL — AB (ref 1.7–2.4)

## 2016-05-29 LAB — MRSA PCR SCREENING: MRSA by PCR: NEGATIVE

## 2016-05-29 LAB — PROCALCITONIN: PROCALCITONIN: 0.11 ng/mL

## 2016-05-29 MED ORDER — SODIUM CHLORIDE 0.9 % IV BOLUS (SEPSIS)
1000.0000 mL | Freq: Once | INTRAVENOUS | Status: AC
  Administered 2016-05-29: 1000 mL via INTRAVENOUS

## 2016-05-29 MED ORDER — MAGNESIUM SULFATE 2 GM/50ML IV SOLN
2.0000 g | Freq: Once | INTRAVENOUS | Status: AC
  Administered 2016-05-30: 2 g via INTRAVENOUS
  Filled 2016-05-29: qty 50

## 2016-05-29 MED ORDER — THIAMINE HCL 100 MG/ML IJ SOLN
100.0000 mg | Freq: Every day | INTRAMUSCULAR | Status: DC
  Administered 2016-05-29 – 2016-05-31 (×3): 100 mg via INTRAVENOUS
  Filled 2016-05-29 (×3): qty 2

## 2016-05-29 NOTE — Progress Notes (Addendum)
[facility] PROGRESS NOTE  Eric CopaRobert Cameron ZOX:[id number] DOB: May 15, 1934 DOA: 05/28/2016  PCP: Merrilee SeashoreAnne Alexander, MD  Brief History/Interval Summary: 82 year old African-American male with history of dementia, questionable history of seizure, hypertension and diabetes who lives with his brother and sister was brought into the hospital due to altered mental status and fever. Patient was found to have an abnormal UA suggesting UTI. There was evidence for sepsis. He was admitted to the hospital for further management.  Reason for Visit: Urinary tract infection with sepsis  Consultants: None  Procedures: EEG is pending  Antibiotics: On vancomycin and Zosyn  Subjective/Interval History: Patient not responding. He is noted to get irritated when examined. Does not open his eyes. Mostly in a fetal position.  ROS: Unable to do due to his encephalopathy  Objective:  Vital Signs  Vitals:   05/29/16 0752 05/29/16 0800 05/29/16 0900 05/29/16 1000  BP: 120/75     Pulse: 83 78 77 84  Resp: 18 17 16 17   Temp: 99 F (37.2 C)     TempSrc: Rectal     SpO2: 100% 97% 100% 99%  Weight:        Intake/Output Summary (Last 24 hours) at 05/29/16 1048 Last data filed at 05/29/16 1002  Gross per 24 hour  Intake          1768.33 ml  Output             1400 ml  Net           368.33 ml   Filed Weights   05/29/16 0230  Weight: 55.8 kg (123 lb)    General appearance: Not responsive. Resp: clear to auscultation bilaterally Cardio: regular rate and rhythm, S1, S2 normal, no murmur, click, rub or gallop GI: soft, non-tender; bowel sounds normal; no masses,  no organomegaly Extremities: extremities normal, atraumatic, no cyanosis or edema Neurologic: Unresponsive. Is noted to move his upper and lower extremities. Does not open his eyes. Plantars downgoing.  Lab Results:  Data Reviewed: I have personally reviewed following labs and imaging studies  CBC:  Recent Labs Lab 05/28/16 1827  05/29/16 0535  WBC 12.5* 12.4*  NEUTROABS 10.0* 8.1*  HGB 11.0* 9.6*  HCT 35.1* 31.0*  MCV 92.9 92.5  PLT 272 256    Basic Metabolic Panel:  Recent Labs Lab 05/28/16 1827 05/29/16 0535  NA 146* 149*  K 3.3* 3.3*  CL 103 110  CO2 30 27  GLUCOSE 256* 196*  BUN 17 13  CREATININE 0.80 0.74  CALCIUM 9.4 8.5*    GFR: Estimated Creatinine Clearance: 59.1 mL/min (by C-G formula based on SCr of 0.74 mg/dL).  Liver Function Tests:  Recent Labs Lab 05/28/16 1827  AST 37  ALT 47  ALKPHOS 83  BILITOT 0.4  PROT 8.2*  ALBUMIN 3.1*    Cardiac Enzymes:  Recent Labs Lab 05/28/16 2307 05/29/16 0535 05/29/16 0831  TROPONINI 0.04* 0.05* 0.05*     Recent Results (from the past 240 hour(s))  MRSA PCR Screening     Status: None   Collection Time: 05/29/16  2:21 AM  Result Value Ref Range Status   MRSA by PCR NEGATIVE NEGATIVE Final    Comment:        The GeneXpert MRSA Assay (FDA approved for NASAL specimens only), is one component of a comprehensive MRSA colonization surveillance program. It is not intended to diagnose MRSA infection nor to guide or monitor treatment for MRSA infections.       Radiology Studies:  Ct Head Wo Contrast  Result Date: 05/29/2016 CLINICAL DATA:  Acute encephalopathy. History of seizures, hypertension, diabetes, dementia, and Alzheimer's disease. EXAM: CT HEAD WITHOUT CONTRAST TECHNIQUE: Contiguous axial images were obtained from the base of the skull through the vertex without intravenous contrast. COMPARISON:  MRI brain 07/02/2015.  CT head 07/01/2015. FINDINGS: Brain: Diffuse cerebral atrophy. Ventricular dilatation consistent with central atrophy. Patchy low-attenuation changes in the deep white matter consistent with small vessel ischemia. Prominent calcification of the falx. No mass effect or midline shift. No abnormal extra-axial fluid collections. Gray-white matter junctions are distinct. Basal cisterns are not effaced. No acute  intracranial hemorrhage. Vascular: Vascular calcifications are present peer Skull: Normal. Negative for fracture or focal lesion. Sinuses/Orbits: No acute finding. Other: No significant changes since previous study. IMPRESSION: No acute intracranial abnormalities. Mild chronic atrophy and small vessel ischemic changes. Electronically Signed   By: Burman Nieves M.D.   On: 05/29/2016 00:37   Dg Chest Port 1 View  Result Date: 05/28/2016 CLINICAL DATA:  High fever and altered mental status. Patient was uncooperative. EXAM: PORTABLE CHEST 1 VIEW COMPARISON:  05/28/2016 at 1801 hours FINDINGS: Shallow inspiration. Heart size and pulmonary vascularity are normal. No focal airspace disease or consolidation in the lungs. No pneumothorax. Calcified and tortuous aorta. IMPRESSION: No evidence of active pulmonary disease. Electronically Signed   By: Burman Nieves M.D.   On: 05/28/2016 23:23   Dg Chest Port 1 View  Result Date: 05/28/2016 CLINICAL DATA:  Acute mental status change.  Fever. EXAM: PORTABLE CHEST 1 VIEW COMPARISON:  July 04, 2015 FINDINGS: The study is markedly limited due to positioning. The patient was unable/unwilling to cooperate. Within this limitation, no pneumothorax. The right lateral lower lung was not imaged. Within visualize limits, no pneumonia identified. The cardiomediastinal silhouette is unchanged given difference in positioning. IMPRESSION: Markedly limited study as above.  No acute abnormalities are seen. Electronically Signed   By: Gerome Sam III M.D   On: 05/28/2016 18:15     Medications:  Scheduled: . aspirin  300 mg Rectal Daily  . levETIRAcetam  500 mg Intravenous Q12H  . piperacillin-tazobactam (ZOSYN)  IV  3.375 g Intravenous Q8H  . sodium chloride flush  3 mL Intravenous Q12H  . vancomycin  500 mg Intravenous Q12H   Continuous: . 0.9 % NaCl with KCl 40 mEq / L 100 mL/hr at 05/29/16 1002   ZOX:[id number] **OR** acetaminophen, ondansetron **OR**  ondansetron (ZOFRAN) IV  Assessment/Plan:  Principal Problem:   Sepsis (HCC) Active Problems:   Acute encephalopathy   HTN (hypertension)   Diabetes mellitus type 2, uncontrolled (HCC)   Dementia   HLD (hyperlipidemia)   H/O: CVA (cerebrovascular accident)   Seizures (HCC)   UTI (urinary tract infection)    Acute encephalopathy attributed to sepsis, urine is the suspected source. CT head did not show any acute findings. Patient does not have any focal deficits on examination. Patient does have a history of dementia, which appears to be moderate to advanced. It is expected that his mental status will improve as his infection is treated. If not, may have to consider MRI.  Sepsis secondary to urinary tract infection UA noted to be normal. Follow-up on urine cultures. Follow up also on blood cultures. Influenza PCR was negative. His neck is soft and supple. Pro-calcitonin level 0.11. Continue broad-spectrum antibiotics for now. Cooling blanket to control temperature. Continue acetaminophen. Lactic acid level was noted to be elevated. Seems to be improving now.  Mildly elevated troponin  Likely due to sepsis. EKG shows T-wave inversion in V3, V4, V5, but these have been noted previously as well. Troponin level trend has been flat. Due to his history of dementia patient is not a candidate for further testing.  Questionable history of seizure Not on any antiepileptic medications at home. EEG is pending. Empirically started on Keppra due to concern for seizure activity at home. He does have risk factors to have seizure.  Hypokalemia Potassium added to maintenance fluids. Check magnesium.  Dementia Patient noted to be on Namenda at home. This has been held for now. Give thiamine intravenously. Due to encephalopathy he is at high risk for aspiration, so leave him nothing by mouth for now.  DVT Prophylaxis: SCDs    Code Status: DO NOT RESUSCITATE  Family Communication: No family at bedside   Disposition Plan: Continue management as outlined above. Continue stepdown setting for today.    LOS: 1 day   [facility]  [facility] Pager [phone] 05/29/2016, 10:48 AM  If 7PM-7AM, please contact night-coverage at [email or web address], password [facility]

## 2016-05-29 NOTE — Progress Notes (Signed)
Pt was escorted to CT by RN because of positioning. CT completed on pt. Pt lost IV assess at some point during the night, IV  Was replaced, bolus was started after the IV was replaced. There were multiple unsuccessful EKG on pt on the floor, Due to pt's positioning,EKG was generating a lot of artifacts. Pt was transferred to 4N. MD paged and updated on all of these.

## 2016-05-29 NOTE — Progress Notes (Signed)
Pt arrived the floor in the presence of 2 ED NTs at 22:50. Pt was assessed to be not alert, balled up in the bed and febrile. Pt 's fever was found unresponsive to tylenol upon assessment. MD was notified of pt's status of non responsiveness and fever. MD put in an order to transfer pt to step down. Pt was also ordered suppository Tylenol, and was administered. Will continue to monitor.

## 2016-05-29 NOTE — Progress Notes (Signed)
CRITICAL VALUE ALERT  Critical value received: Lactic 2.1  Date of notification:  05/29/16  Time of notification:  12:25am  Critical value read back:Yes.    Nurse who received alert:  Rennis Goldenoyin Harvey Lingo, RN  MD notified (1st page):  Montez Moritaarter, MD  Time of first page:  12:32am  MD notified (2nd page):  Time of second page:  Responding MD:  Montez Moritaarter, MD  Time MD responded:  12:33am

## 2016-05-29 NOTE — Progress Notes (Signed)
Lab called and reported critical lactic acid at 2.0 this morning. This is down from the previous reported 2.1. RN will notify MD.

## 2016-05-30 ENCOUNTER — Inpatient Hospital Stay (HOSPITAL_COMMUNITY): Payer: Medicare Other

## 2016-05-30 DIAGNOSIS — G9340 Encephalopathy, unspecified: Secondary | ICD-10-CM

## 2016-05-30 LAB — BASIC METABOLIC PANEL
Anion gap: 11 (ref 5–15)
BUN: 17 mg/dL (ref 6–20)
CALCIUM: 8.3 mg/dL — AB (ref 8.9–10.3)
CO2: 25 mmol/L (ref 22–32)
CREATININE: 0.95 mg/dL (ref 0.61–1.24)
Chloride: 119 mmol/L — ABNORMAL HIGH (ref 101–111)
GFR calc Af Amer: >60 mL/min (ref >60)
GLUCOSE: 114 mg/dL — AB (ref 65–99)
Potassium: 3.6 mmol/L (ref 3.5–5.1)
Sodium: 155 mmol/L — ABNORMAL HIGH (ref 135–145)

## 2016-05-30 LAB — MAGNESIUM: Magnesium: 2.5 mg/dL — ABNORMAL HIGH (ref 1.7–2.4)

## 2016-05-30 LAB — CBC
HCT: 31.3 % — ABNORMAL LOW (ref 39.0–52.0)
Hemoglobin: 9.6 g/dL — ABNORMAL LOW (ref 13.0–17.0)
MCH: 28.9 pg (ref 26.0–34.0)
MCHC: 30.7 g/dL (ref 30.0–36.0)
MCV: 94.3 fL (ref 78.0–100.0)
PLATELETS: 207 10*3/uL (ref 150–400)
RBC: 3.32 MIL/uL — ABNORMAL LOW (ref 4.22–5.81)
RDW: 14.6 % (ref 11.5–15.5)
WBC: 10.5 10*3/uL (ref 4.0–10.5)

## 2016-05-30 LAB — URINE CULTURE

## 2016-05-30 MED ORDER — POTASSIUM CL IN DEXTROSE 5% 20 MEQ/L IV SOLN
20.0000 meq | INTRAVENOUS | Status: DC
  Administered 2016-05-30 – 2016-06-03 (×7): 20 meq via INTRAVENOUS
  Filled 2016-05-30 (×11): qty 1000

## 2016-05-30 NOTE — Progress Notes (Signed)
Pt. would assume fetal position at times.

## 2016-05-30 NOTE — Progress Notes (Signed)
Transferred to 5west room27 by bed, stable, report given to RN .belongings with pt.

## 2016-05-30 NOTE — Progress Notes (Signed)
EEG completed; results pending.    

## 2016-05-30 NOTE — Progress Notes (Signed)
Lunch tray came, attempted to feed but lethargic-arousable.

## 2016-05-30 NOTE — Evaluation (Signed)
Clinical/Bedside Swallow Evaluation Patient Details  Name: Eric Cameron MRN: [id number] Date of Birth: 03-25-1935  Today's Date: 05/30/2016 Time: SLP Start Time (ACUTE ONLY): 1205 SLP Stop Time (ACUTE ONLY): 1230 SLP Time Calculation (min) (ACUTE ONLY): 25 min  Past Medical History:  Past Medical History:  Diagnosis Date  . Acute encephalopathy   . Alzheimer disease   . Dehydration   . Dementia   . Depression   . Diabetes mellitus without complication (HCC)   . Elevated troponin   . Hypertension   . PNA (pneumonia)   . Pressure ulcer   . Seizures (HCC)    Past Surgical History:  Past Surgical History:  Procedure Laterality Date  . NO PAST SURGERIES     HPI:  82 year old male admitted 05/28/16 due to fever and AMS. PMH significant for dementia, seizures, HTN, DM, PNA, CVA, UTI. BSE ordered to evaluate swallow function and safety and to identify least restrictive diet.   Assessment / Plan / Recommendation Clinical Impression  Oral cavity was noted to be dry, with missing dentition and poor condition. Pt unable to follow commands for evaluation of oral motor strength and function, and was essentially nonverbal. Pt refused trials of ice chips and water, but accepted nectar thick and puree consistencies. No overt s/s aspiration observed on either consistency, however, BSE was limited due to pt refusal of additional po trials after only a few bites.  Recommend beginning puree diet and nectar thick liquids WHEN ALERT and UPRIGHT, meds crushed in puree. Encouraged RN to set up suction to facilitate oral care given cognitive impairment. Also recommend Palliative Care consult for establishment of GOC. [facility] MD and RN informed of results/recommendations. SLP will follow for assessment of diet tolerance and education.     Aspiration Risk  Mild aspiration risk;Moderate aspiration risk    Diet Recommendation Nectar-thick liquid;Dysphagia 1 (Puree)    Liquid Administration via: Spoon;Cup Medication Administration: Crushed with puree Supervision: Full supervision/cueing for compensatory strategies Compensations: Slow rate;Small sips/bites;Minimize environmental distractions Postural Changes: Remain upright for at least 30 minutes after po intake;Seated upright at 90 degrees    Other  Recommendations Recommended Consults:  (Palliative Care to facilitate establishment of GOC) Oral Care Recommendations: Oral care before and after PO Other Recommendations: Order thickener from pharmacy;Have oral suction available   Follow up Recommendations 24 hour supervision/assistance      Frequency and Duration min 1 x/week  2 weeks;1 week       Prognosis Prognosis for Safe Diet Advancement: Fair Barriers to Reach Goals: Cognitive deficits      Swallow Study   General Date of Onset: 05/28/16 HPI: 82 year old male admitted 05/28/16 due to fever and AMS. PMH significant for dementia, seizures, HTN, DM, PNA, CVA, UTI. BSE ordered to evaluate swallow function and safety and to identify least restrictive diet. Type of Study: Bedside Swallow Evaluation Previous Swallow Assessment: BSE 07/02/15 - rec Dys 3 and thin liquids Diet Prior to this Study: NPO Temperature Spikes Noted: Yes (103 on admit) Respiratory Status: Room air History of Recent Intubation: No Behavior/Cognition: Alert;Confused;Distractible;Requires cueing;Doesn't follow directions Oral Cavity Assessment: Dry Oral Care Completed by SLP: Yes Oral Cavity - Dentition: Poor condition;Missing dentition Self-Feeding Abilities: Total assist Patient Positioning: Upright in bed Baseline Vocal Quality: Low vocal intensity Volitional Cough: Cognitively unable to elicit Volitional Swallow: Unable to elicit    Oral/Motor/Sensory Function Overall Oral Motor/Sensory Function:  (unable to assess - pt unable to follow verbal directions)  Ice Chips Ice chips:  (pt refused)   Thin Liquid Thin  Liquid:  (pt refused)    Nectar Thick Nectar Thick Liquid: Within functional limits Presentation: Spoon   Honey Thick Honey Thick Liquid: Not tested   Puree Puree: Within functional limits Presentation: Spoon   Solid   GO   Solid: Not tested       Eric Cameron, MSP, CCC-SLP [phone]  Eric Cameron 05/30/2016,1:38 PM

## 2016-05-30 NOTE — Procedures (Signed)
ELECTROENCEPHALOGRAM REPORT  Date of Study: 02/12/208  Patient's Name: Eric CopaRobert Cameron MRN: [id number] Date of Birth: [birth year]  Referring Provider: Dr. Michael LitterNikki Carter  Clinical History: This is a 82 year old man with altered mental status.  Medications: Hospital Medications L1 acetaminophen (TYLENOL) suppository 650 mg  L1 acetaminophen (TYLENOL) tablet 650 mg   aspirin suppository 300 mg   dextrose 5 % with KCl 20 mEq / L infusion   levETIRAcetam (KEPPRA) 500 mg in sodium chloride 0.9 % 100 mL IVPB  L2 ondansetron (ZOFRAN) injection 4 mg  L2 ondansetron (ZOFRAN) tablet 4 mg   piperacillin-tazobactam (ZOSYN) IVPB 3.375 g   sodium chloride flush (NS) 0.9 % injection 3 mL   thiamine (B-1) injection 100 mg    Technical Summary: A multichannel digital EEG recording measured by the international 10-20 system with electrodes applied with paste and impedances below 5000 ohms performed in our laboratory with EKG monitoring in an awake and drowsy patient.  Hyperventilation and photic stimulation were not performed.  The digital EEG was referentially recorded, reformatted, and digitally filtered in a variety of bipolar and referential montages for optimal display.    Description: The patient is awake and drowsy during the recording. He is noted to be confused, in a fetal position. There is no clear posterior dominant rhythm. The background consists of diffuse low voltage activity with no focal slowing seen. During drowsiness, there is an increase in theta slowing of the background. Normal sleep architecture is not seen.  Hyperventilation and photic stimulation were not performed  There were no epileptiform discharges or electrographic seizures seen.    EKG lead was unremarkable.  Impression: This awake and drowsy EEG is mildly abnormal due to diffuse low voltage activity.  Clinical Correlation: Bilateral and generalized low voltage activity can be seen with degenerative or metabolic  disorders. It can be a normal variant and seen with advancing age. The absence of epileptiform discharges does not exclude a clinical diagnosis of epilepsy.  Clinical correlation is advised.   Patrcia DollyKaren Aquino, M.D.

## 2016-05-30 NOTE — Care Management Note (Signed)
Case Management Note  Patient Details  Name: Eric Cameron MRN: [id number] Date of Birth: 08-01-1934  Subjective/Objective:        Adm w sepsis, confused            Action/Plan: lives w brother and sister, pcp dr Conservation officer, [facility]   Expected Discharge Date:                  Expected Discharge Plan:  Home w Home Health Services  In-House Referral:     Discharge planning Services     Post Acute Care Choice:    Choice offered to:     DME Arranged:    DME Agency:     HH Arranged:    HH Agency:     Status of Service:  In process, will continue to follow  If discussed at Long Length of Stay Meetings, dates discussed:    Additional Comments: cont confusion. Will follw for dc needs as pt progresses.  Hanley Haysowell, Vernadine Coombs T, RN 05/30/2016, 10:58 AM

## 2016-05-30 NOTE — Progress Notes (Signed)
[facility] PROGRESS NOTE  [name] ZOX:[id number] DOB: 1935/03/12 DOA: 05/28/2016  PCP: [name], MD  Brief History/Interval Summary: 82 year old African-American male with history of dementia, questionable history of seizure, hypertension and diabetes who lives with his brother and sister was brought into the hospital due to altered mental status and fever. Patient was found to have an abnormal UA suggesting UTI. There was evidence for sepsis. He was admitted to the hospital for further management.  Reason for Visit: Urinary tract infection with sepsis  Consultants: None  Procedures: EEG is pending  Antibiotics: On vancomycin and Zosyn. Vancomycin to be discontinued today.  Subjective/Interval History: Patient responses today to voice command. Still not answering questions. Still in a curled position.   ROS: Unable to do due to his encephalopathy  Objective:  Vital Signs  Vitals:   05/30/16 0036 05/30/16 0045 05/30/16 0408 05/30/16 0700  BP: 126/71  (!) 106/57 (!) 112/57  Pulse: 76  83 70  Resp: 14  19 14   Temp: (!) 96.6 F (35.9 C) 98.4 F (36.9 C) 98.1 F (36.7 C) 98.2 F (36.8 C)  TempSrc: Rectal Rectal Rectal Oral  SpO2: 95%  98% 100%  Weight:        Intake/Output Summary (Last 24 hours) at 05/30/16 1048 Last data filed at 05/30/16 1000  Gross per 24 hour  Intake          2790.42 ml  Output             3975 ml  Net         -1184.58 ml   Filed Weights   05/29/16 0230  Weight: 55.8 kg (123 lb)    General appearance: More responsive today. No distress. Resp: clear to auscultation bilaterally Cardio: regular rate and rhythm, S1, S2 normal, no murmur, click, rub or gallop GI: soft, non-tender; bowel sounds normal; no masses,  no organomegaly Extremities: extremities normal, atraumatic, no cyanosis or edema Neurologic: More responsive today. Moving all his extremities. Does open his eyes.  Lab Results:  Data Reviewed: I have  personally reviewed following labs and imaging studies  CBC:  Recent Labs Lab 05/28/16 1827 05/29/16 0535 05/30/16 0745  WBC 12.5* 12.4* 10.5  NEUTROABS 10.0* 8.1*  --   HGB 11.0* 9.6* 9.6*  HCT 35.1* 31.0* 31.3*  MCV 92.9 92.5 94.3  PLT 272 256 207    Basic Metabolic Panel:  Recent Labs Lab 05/28/16 1827 05/29/16 0535 05/29/16 1130 05/30/16 0745  NA 146* 149*  --  155*  K 3.3* 3.3*  --  3.6  CL 103 110  --  119*  CO2 30 27  --  25  GLUCOSE 256* 196*  --  114*  BUN 17 13  --  17  CREATININE 0.80 0.74  --  0.95  CALCIUM 9.4 8.5*  --  8.3*  MG  --   --  1.5* 2.5*    GFR: Estimated Creatinine Clearance: 49.8 mL/min (by C-G formula based on SCr of 0.95 mg/dL).  Liver Function Tests:  Recent Labs Lab 05/28/16 1827  AST 37  ALT 47  ALKPHOS 83  BILITOT 0.4  PROT 8.2*  ALBUMIN 3.1*    Cardiac Enzymes:  Recent Labs Lab 05/28/16 2307 05/29/16 0535 05/29/16 0831  TROPONINI 0.04* 0.05* 0.05*     Recent Results (from the past 240 hour(s))  Culture, Urine     Status: Abnormal   Collection Time: 05/28/16  5:20 PM  Result Value Ref Range Status  Specimen Description URINE, RANDOM  Final   Special Requests ADDED AT 0108 ON [id number]  Final   Culture MULTIPLE SPECIES PRESENT, SUGGEST RECOLLECTION (A)  Final   Report Status 05/30/2016 FINAL  Final  Blood Culture (routine x 2)     Status: None (Preliminary result)   Collection Time: 05/28/16  6:27 PM  Result Value Ref Range Status   Specimen Description BLOOD LEFT FOREARM  Final   Special Requests BOTTLES DRAWN AEROBIC AND ANAEROBIC 5CC  Final   Culture NO GROWTH < 24 HOURS  Final   Report Status PENDING  Incomplete  Blood Culture (routine x 2)     Status: None (Preliminary result)   Collection Time: 05/28/16  6:52 PM  Result Value Ref Range Status   Specimen Description BLOOD RIGHT HAND  Final   Special Requests IN PEDIATRIC BOTTLE 2CC  Final   Culture NO GROWTH < 24 HOURS  Final   Report Status PENDING   Incomplete  MRSA PCR Screening     Status: None   Collection Time: 05/29/16  2:21 AM  Result Value Ref Range Status   MRSA by PCR NEGATIVE NEGATIVE Final    Comment:        The GeneXpert MRSA Assay (FDA approved for NASAL specimens only), is one component of a comprehensive MRSA colonization surveillance program. It is not intended to diagnose MRSA infection nor to guide or monitor treatment for MRSA infections.       Radiology Studies: Ct Head Wo Contrast  Result Date: 05/29/2016 CLINICAL DATA:  Acute encephalopathy. History of seizures, hypertension, diabetes, dementia, and Alzheimer's disease. EXAM: CT HEAD WITHOUT CONTRAST TECHNIQUE: Contiguous axial images were obtained from the base of the skull through the vertex without intravenous contrast. COMPARISON:  MRI brain 07/02/2015.  CT head 07/01/2015. FINDINGS: Brain: Diffuse cerebral atrophy. Ventricular dilatation consistent with central atrophy. Patchy low-attenuation changes in the deep white matter consistent with small vessel ischemia. Prominent calcification of the falx. No mass effect or midline shift. No abnormal extra-axial fluid collections. Gray-white matter junctions are distinct. Basal cisterns are not effaced. No acute intracranial hemorrhage. Vascular: Vascular calcifications are present peer Skull: Normal. Negative for fracture or focal lesion. Sinuses/Orbits: No acute finding. Other: No significant changes since previous study. IMPRESSION: No acute intracranial abnormalities. Mild chronic atrophy and small vessel ischemic changes. Electronically Signed   By: Burman NievesWilliam  Stevens M.D.   On: 05/29/2016 00:37   Dg Chest Port 1 View  Result Date: 05/28/2016 CLINICAL DATA:  High fever and altered mental status. Patient was uncooperative. EXAM: PORTABLE CHEST 1 VIEW COMPARISON:  05/28/2016 at 1801 hours FINDINGS: Shallow inspiration. Heart size and pulmonary vascularity are normal. No focal airspace disease or consolidation in  the lungs. No pneumothorax. Calcified and tortuous aorta. IMPRESSION: No evidence of active pulmonary disease. Electronically Signed   By: Burman NievesWilliam  Stevens M.D.   On: 05/28/2016 23:23   Dg Chest Port 1 View  Result Date: 05/28/2016 CLINICAL DATA:  Acute mental status change.  Fever. EXAM: PORTABLE CHEST 1 VIEW COMPARISON:  July 04, 2015 FINDINGS: The study is markedly limited due to positioning. The patient was unable/unwilling to cooperate. Within this limitation, no pneumothorax. The right lateral lower lung was not imaged. Within visualize limits, no pneumonia identified. The cardiomediastinal silhouette is unchanged given difference in positioning. IMPRESSION: Markedly limited study as above.  No acute abnormalities are seen. Electronically Signed   By: Gerome Samavid  Williams III M.D   On: 05/28/2016 18:15  Medications:  Scheduled: . aspirin  300 mg Rectal Daily  . levETIRAcetam  500 mg Intravenous Q12H  . piperacillin-tazobactam (ZOSYN)  IV  3.375 g Intravenous Q8H  . sodium chloride flush  3 mL Intravenous Q12H  . thiamine injection  100 mg Intravenous Daily   Continuous: . dextrose 5 % with KCl 20 mEq / L     YNW:GNFAOZHYQMVHQ **OR** acetaminophen, ondansetron **OR** ondansetron (ZOFRAN) IV  Assessment/Plan:  Principal Problem:   Sepsis (HCC) Active Problems:   Acute encephalopathy   HTN (hypertension)   Diabetes mellitus type 2, uncontrolled (HCC)   Dementia   HLD (hyperlipidemia)   H/O: CVA (cerebrovascular accident)   Seizures (HCC)   UTI (urinary tract infection)    Acute encephalopathy attributed to sepsis, urine is the suspected source. CT head did not show any acute findings. Patient does not have any focal deficits on examination. Patient does have a history of dementia, which appears to be moderate to advanced. Mental status has improved some today. Continue to monitor. Swallow evaluation.   Sepsis likely secondary to urinary tract infection UA noted to be  abnormal. Unfortunately, no growth noted on urine cultures. Multiple species seen. Continue Zosyn for now. Blood cultures negative so far. Okay to discontinue vancomycin. Influenza was negative. Fever appears to have subsided. He could've had a viral syndrome as well. Continue to monitor closely. Turn off the cooling blanket.   Mildly elevated troponin Likely due to sepsis. EKG shows T-wave inversion in V3, V4, V5, but these have been noted previously as well. Troponin level trend has been flat. Due to his history of dementia, patient is not a candidate for further testing.  Questionable history of seizure Not on any antiepileptic medications at home. EEG is pending. Empirically started on Keppra due to concern for seizure activity at home. He does have risk factors to have seizure.  Hypokalemia and hypomagnesemia Potassium and magnesium repleted.  Hypernatremia Change IV fluids to D5.  Dementia Patient noted to be on Namenda at home. This has been held for now. On thiamine intravenously. Due to encephalopathy he is at high risk for aspiration. Since he is more responsive today, we'll ask speech therapy to evaluate him.  DVT Prophylaxis: SCDs    Code Status: DO NOT RESUSCITATE  Family Communication: No family at bedside  Disposition Plan: Continue management as outlined above. Could be transferred to the floor later today if remains stable    LOS: 2 days   [facility]  [facility] Pager [phone] 05/30/2016, 10:48 AM  If 7PM-7AM, please contact night-coverage at [email or web address], password [facility]

## 2016-05-31 LAB — CBC
HEMATOCRIT: 32.2 % — AB (ref 39.0–52.0)
HEMOGLOBIN: 10 g/dL — AB (ref 13.0–17.0)
MCH: 29.2 pg (ref 26.0–34.0)
MCHC: 31.1 g/dL (ref 30.0–36.0)
MCV: 93.9 fL (ref 78.0–100.0)
Platelets: 194 10*3/uL (ref 150–400)
RBC: 3.43 MIL/uL — ABNORMAL LOW (ref 4.22–5.81)
RDW: 14.4 % (ref 11.5–15.5)
WBC: 9.6 10*3/uL (ref 4.0–10.5)

## 2016-05-31 LAB — BASIC METABOLIC PANEL
ANION GAP: 10 (ref 5–15)
BUN: 15 mg/dL (ref 6–20)
CALCIUM: 8.6 mg/dL — AB (ref 8.9–10.3)
CO2: 25 mmol/L (ref 22–32)
Chloride: 117 mmol/L — ABNORMAL HIGH (ref 101–111)
Creatinine, Ser: 0.82 mg/dL (ref 0.61–1.24)
GFR calc non Af Amer: >60 mL/min (ref >60)
GLUCOSE: 201 mg/dL — AB (ref 65–99)
POTASSIUM: 2.9 mmol/L — AB (ref 3.5–5.1)
Sodium: 152 mmol/L — ABNORMAL HIGH (ref 135–145)

## 2016-05-31 MED ORDER — ASPIRIN 325 MG PO TABS
325.0000 mg | ORAL_TABLET | Freq: Every day | ORAL | Status: DC
  Administered 2016-05-31 – 2016-06-03 (×4): 325 mg via ORAL
  Filled 2016-05-31 (×4): qty 1

## 2016-05-31 MED ORDER — ATORVASTATIN CALCIUM 20 MG PO TABS
20.0000 mg | ORAL_TABLET | Freq: Every day | ORAL | Status: DC
  Administered 2016-05-31 – 2016-06-02 (×3): 20 mg via ORAL
  Filled 2016-05-31 (×3): qty 1

## 2016-05-31 MED ORDER — CITALOPRAM HYDROBROMIDE 20 MG PO TABS
10.0000 mg | ORAL_TABLET | Freq: Every day | ORAL | Status: DC
  Administered 2016-05-31 – 2016-06-02 (×3): 10 mg via ORAL
  Filled 2016-05-31 (×3): qty 1

## 2016-05-31 MED ORDER — VITAMIN B-1 100 MG PO TABS
100.0000 mg | ORAL_TABLET | Freq: Every day | ORAL | Status: DC
  Administered 2016-06-01 – 2016-06-03 (×3): 100 mg via ORAL
  Filled 2016-05-31 (×3): qty 1

## 2016-05-31 MED ORDER — MEMANTINE HCL 10 MG PO TABS
10.0000 mg | ORAL_TABLET | Freq: Two times a day (BID) | ORAL | Status: DC
  Administered 2016-05-31 – 2016-06-03 (×6): 10 mg via ORAL
  Filled 2016-05-31 (×6): qty 1

## 2016-05-31 MED ORDER — LACOSAMIDE 50 MG PO TABS
50.0000 mg | ORAL_TABLET | Freq: Two times a day (BID) | ORAL | Status: DC
  Administered 2016-05-31 – 2016-06-03 (×7): 50 mg via ORAL
  Filled 2016-05-31 (×7): qty 1

## 2016-05-31 MED ORDER — CEFPODOXIME PROXETIL 200 MG PO TABS
200.0000 mg | ORAL_TABLET | Freq: Two times a day (BID) | ORAL | Status: DC
  Administered 2016-05-31 – 2016-06-03 (×7): 200 mg via ORAL
  Filled 2016-05-31 (×8): qty 1

## 2016-05-31 MED ORDER — POTASSIUM CHLORIDE CRYS ER 20 MEQ PO TBCR
40.0000 meq | EXTENDED_RELEASE_TABLET | ORAL | Status: AC
  Administered 2016-05-31 (×2): 40 meq via ORAL
  Filled 2016-05-31 (×2): qty 2

## 2016-05-31 NOTE — Progress Notes (Signed)
[facility] PROGRESS NOTE  Eric Cameron ZOX:[id number] DOB: 06-07-1934 DOA: 05/28/2016  PCP: [name], MD  Brief History/Interval Summary: 82 year old African-American male with history of dementia, questionable history of seizure, hypertension and diabetes who lives with his brother and sister was brought into the hospital due to altered mental status and fever. Patient was found to have an abnormal UA suggesting UTI. There was evidence for sepsis. He was admitted to the hospital for further management.  Reason for Visit: Urinary tract infection with sepsis. Acute encephalopathy.  Consultants: None  Procedures:  EEG  Impression: This awake and drowsy EEG is mildly abnormal due to diffuse low voltage activity.  Antibiotics: On vancomycin and Zosyn. Vancomycin discontinued 2/12.  Subjective/Interval History: Patient much more awake and alert today. Still remains confused but does answer questions.  ROS: Unable to do due to his encephalopathy  Objective:  Vital Signs  Vitals:   05/30/16 1600 05/30/16 1820 05/30/16 2121 05/31/16 0451  BP: 128/67 119/61 136/73 110/61  Pulse: 71 65 70 66  Resp: 13 18 18 18   Temp: 98.6 F (37 C) 98.6 F (37 C) 98.2 F (36.8 C) 97.7 F (36.5 C)  TempSrc: Oral  Oral Oral  SpO2:   96% 100%  Weight:  54.3 kg (119 lb 11.2 oz)      Intake/Output Summary (Last 24 hours) at 05/31/16 0946 Last data filed at 05/31/16 0751  Gross per 24 hour  Intake           1502.5 ml  Output              901 ml  Net            601.5 ml   Filed Weights   05/29/16 0230 05/30/16 1820  Weight: 55.8 kg (123 lb) 54.3 kg (119 lb 11.2 oz)    General appearance: Awake and alert today. Still distracted. No distress. Resp: clear to auscultation bilaterally Cardio: regular rate and rhythm, S1, S2 normal, no murmur, click, rub or gallop GI: soft, non-tender; bowel sounds normal; no masses,  no organomegaly Extremities: extremities normal, atraumatic,  no cyanosis or edema Neurologic: Awake and alert. He tells me his name. Knows that he is in [place]. Follows commands. Moving all his extremities.   Lab Results:  Data Reviewed: I have personally reviewed following labs and imaging studies  CBC:  Recent Labs Lab 05/28/16 1827 05/29/16 0535 05/30/16 0745 05/31/16 0556  WBC 12.5* 12.4* 10.5 9.6  NEUTROABS 10.0* 8.1*  --   --   HGB 11.0* 9.6* 9.6* 10.0*  HCT 35.1* 31.0* 31.3* 32.2*  MCV 92.9 92.5 94.3 93.9  PLT 272 256 207 194    Basic Metabolic Panel:  Recent Labs Lab 05/28/16 1827 05/29/16 0535 05/29/16 1130 05/30/16 0745 05/31/16 0556  NA 146* 149*  --  155* 152*  K 3.3* 3.3*  --  3.6 2.9*  CL 103 110  --  119* 117*  CO2 30 27  --  25 25  GLUCOSE 256* 196*  --  114* 201*  BUN 17 13  --  17 15  CREATININE 0.80 0.74  --  0.95 0.82  CALCIUM 9.4 8.5*  --  8.3* 8.6*  MG  --   --  1.5* 2.5*  --     GFR: Estimated Creatinine Clearance: 56.1 mL/min (by C-G formula based on SCr of 0.82 mg/dL).  Liver Function Tests:  Recent Labs Lab 05/28/16 1827  AST 37  ALT 47  ALKPHOS 83  BILITOT 0.4  PROT 8.2*  ALBUMIN 3.1*    Cardiac Enzymes:  Recent Labs Lab 05/28/16 2307 05/29/16 0535 05/29/16 0831  TROPONINI 0.04* 0.05* 0.05*     Recent Results (from the past 240 hour(s))  Culture, Urine     Status: Abnormal   Collection Time: 05/28/16  5:20 PM  Result Value Ref Range Status   Specimen Description URINE, RANDOM  Final   Special Requests ADDED AT 0108 ON [id number]  Final   Culture MULTIPLE SPECIES PRESENT, SUGGEST RECOLLECTION (A)  Final   Report Status 05/30/2016 FINAL  Final  Blood Culture (routine x 2)     Status: None (Preliminary result)   Collection Time: 05/28/16  6:27 PM  Result Value Ref Range Status   Specimen Description BLOOD LEFT FOREARM  Final   Special Requests BOTTLES DRAWN AEROBIC AND ANAEROBIC 5CC  Final   Culture NO GROWTH 2 DAYS  Final   Report Status PENDING  Incomplete  Blood  Culture (routine x 2)     Status: None (Preliminary result)   Collection Time: 05/28/16  6:52 PM  Result Value Ref Range Status   Specimen Description BLOOD RIGHT HAND  Final   Special Requests IN PEDIATRIC BOTTLE 2CC  Final   Culture NO GROWTH 2 DAYS  Final   Report Status PENDING  Incomplete  MRSA PCR Screening     Status: None   Collection Time: 05/29/16  2:21 AM  Result Value Ref Range Status   MRSA by PCR NEGATIVE NEGATIVE Final    Comment:        The GeneXpert MRSA Assay (FDA approved for NASAL specimens only), is one component of a comprehensive MRSA colonization surveillance program. It is not intended to diagnose MRSA infection nor to guide or monitor treatment for MRSA infections.       Radiology Studies: No results found.   Medications:  Scheduled: . aspirin  300 mg Rectal Daily  . levETIRAcetam  500 mg Intravenous Q12H  . piperacillin-tazobactam (ZOSYN)  IV  3.375 g Intravenous Q8H  . sodium chloride flush  3 mL Intravenous Q12H  . thiamine injection  100 mg Intravenous Daily   Continuous: . dextrose 5 % with KCl 20 mEq / L 20 mEq (05/31/16 0128)   BJY:NWGNFAOZHYQMVPRN:acetaminophen **OR** acetaminophen, ondansetron **OR** ondansetron (ZOFRAN) IV  Assessment/Plan:  Principal Problem:   Sepsis (HCC) Active Problems:   Acute encephalopathy   HTN (hypertension)   Diabetes mellitus type 2, uncontrolled (HCC)   Dementia   HLD (hyperlipidemia)   H/O: CVA (cerebrovascular accident)   Seizures (HCC)   UTI (urinary tract infection)    Acute encephalopathy attributed to sepsis, urine is the suspected source. CT head did not show any acute findings. Patient does not have any focal deficits on examination. Patient does have a history of dementia, which appears to be moderate to advanced. Patient's mental status has significantly improved. PT evaluation. Appreciate speech therapy input.   Sepsis likely secondary to urinary tract infection UA noted to be abnormal.  Unfortunately, no growth noted on urine cultures. Multiple species seen. Blood cultures still negative. Change to oral Vantin today. Stop Zosyn. Vancomycin discontinued on 2/12. Influenza PCR negative. Hasn't had any further episodes of fever. He could've had a viral syndrome as well.   Mildly elevated troponin Likely due to sepsis. EKG shows T-wave inversion in V3, V4, V5, but these have been noted previously as well. Troponin level trend has been flat. Due to his history of dementia, patient is  not a candidate for further testing.  History of seizure D/o Discussed with his sister today. Apparently patient was on Vimpat up until April or May of last year. She thinks that the medication got dropped from his list when she changed his physician. There is no mention of side effects from this medicine. Patient will be placed back on Vimpat. EEG does not show any epileptiform activity. Stop Keppra.   Hypokalemia and hypomagnesemia Magnesium was repleted. Potassium was noted to be low today. Will be aggressively repleted.  Hypernatremia Slowly improving. Continue D5 water Monitor sodium levels.  Dementia Patient was on Namenda at home. This can be resumed. Patient seen by speech therapy. On dysphagia diet. Aspiration precautions.  DVT Prophylaxis: SCDs    Code Status: DO NOT RESUSCITATE  Family Communication: Discussed with the sister. Disposition Plan: Patient is slowly improving. See management outlined above. PT evaluation.    LOS: 3 days   [facility]  [facility] Pager [phone] 05/31/2016, 9:46 AM  If 7PM-7AM, please contact night-coverage at [email or web address], password [facility]

## 2016-05-31 NOTE — Progress Notes (Signed)
Physical Therapy Evaluation Patient Details Name: Eric Cameron MRN: [id number] DOB: 21-Apr-1934 Today's Date: 05/31/2016   History of Present Illness  Patient presents to the hospital with Sepsis. He has advanced dementia. PMH: HTN, pressure ulcer, elevated troponin, dementia  Clinical Impression  Patient is care dependent at this time. He is able to follow commands for mobility but requires significant assist for transfers and ambulation. He would benefit from rehabiliation at a skilled facility to improve his mobility. At baseline he was able to ambulate and transfer with family supervision (per note). He family was not present for goal making. Actue therapy will continue to follow the patient.     Follow Up Recommendations SNF    Equipment Recommendations  None recommended by PT (unkonw if patient has equipment )    Recommendations for Other Services       Precautions / Restrictions Precautions Precautions: Fall Restrictions Weight Bearing Restrictions: No      Mobility  Bed Mobility Overal bed mobility: Needs Assistance Bed Mobility: Supine to Sit;Sit to Supine     Supine to sit: Mod assist     General bed mobility comments: Mod a to get legs out of bed and mod a to sit up in bed.   Transfers Overall transfer level: Needs assistance Equipment used: Rolling walker (2 wheeled) Transfers: Sit to/from Stand Sit to Stand: Max assist         General transfer comment: max tactile cuing. Max a for balance and max tactile cuing to put hands on the walker.  Max tactile cuing for direction.   Ambulation/Gait Ambulation/Gait assistance: Max assist Ambulation Distance (Feet): 8 Feet Assistive device: Rolling walker (2 wheeled)     Gait velocity interpretation: >2.62 ft/sec, indicative of independent community ambulator General Gait Details: Patient required max tactile cuing to keep his hands on the walker and amx a for balance. He was able to follow commands to walk  around the bed.   Stairs            Wheelchair Mobility    Modified Rankin (Stroke Patients Only)       Balance Overall balance assessment: Needs assistance Sitting-balance support: Bilateral upper extremity supported Sitting balance-Leahy Scale: Poor     Standing balance support: Bilateral upper extremity supported Standing balance-Leahy Scale: Poor                               Pertinent Vitals/Pain Pain Assessment: Faces Pain Score: 0-No pain    Home Living Family/patient expects to be discharged to:: Private residence                 Additional Comments: Per chart lives at home with family assistance. Patient unable to answer simple questioning about his house or prior function.     Prior Function Level of Independence: Needs assistance   Gait / Transfers Assistance Needed: Pt independently ambulating per sister report ( per previous vists note)   ADL's / Homemaking Assistance Needed: Sister assists with bathing, sister assists with set-up for dressing, pt is independent for self-feeding, pt is independentl for toileting, but does not remember to change his brief         Hand Dominance   Dominant Hand: Left    Extremity/Trunk Assessment   Upper Extremity Assessment Upper Extremity Assessment: Difficult to assess due to impaired cognition    Lower Extremity Assessment Lower Extremity Assessment: Difficult to assess due to impaired cognition  Communication   Communication: Receptive difficulties;Expressive difficulties;HOH  Cognition Arousal/Alertness: Lethargic Behavior During Therapy: Flat affect;Impulsive Overall Cognitive Status: History of cognitive impairments - at baseline                 General Comments: Patient followed simple commands but could not answer simple questioning. Mod-Max verbal and tacltile cuing required for all activity.     General Comments      Exercises     Assessment/Plan    PT  Assessment Patient needs continued PT services  PT Problem List Decreased strength;Decreased range of motion;Decreased activity tolerance;Decreased balance;Decreased mobility;Decreased cognition;Decreased coordination;Decreased safety awareness;Decreased knowledge of precautions          PT Treatment Interventions DME instruction;Gait training;Stair training;Functional mobility training;Therapeutic activities;Therapeutic exercise;Balance training;Neuromuscular re-education;Patient/family education    PT Goals (Current goals can be found in the Care Plan section)  Acute Rehab PT Goals Patient Stated Goal: Patient unable to state goals 2nd to cognition     Frequency Min 2X/week   Barriers to discharge   No family present to talk about level of support provided at home.     Co-evaluation               End of Session Equipment Utilized During Treatment: Gait belt Activity Tolerance: Patient limited by lethargy Patient left: in chair;with call bell/phone within reach;with chair alarm set;with restraints reapplied (mittins reapplied ) Nurse Communication: Mobility status         Time: [id number] PT Time Calculation (min) (ACUTE ONLY): 28 min   Charges:   PT Evaluation $PT Eval Moderate Complexity: 1 Procedure     PT G Codes:        Dessie Comaavid J Shanley Furlough PT DPT  05/31/2016, 2:33 PM

## 2016-05-31 NOTE — Evaluation (Signed)
Occupational Therapy Evaluation Patient Details Name: Oisin Yoakum MRN: [id number] DOB: [birth year] Today's Date: 05/31/2016    History of Present Illness Patient presents to the hospital with Sepsis. He has advanced dementia. PMH: HTN, pressure ulcer, elevated troponin, dementia   Clinical Impression   Patient presenting with decreased I in self care, balance, functional transfers/mobility, strength, coordination, and safety.Patient required assistance from family for self care prior to information in chart PTA. Patient currently functioning at mod - total A secondary to lethargy and cognition. Patient will benefit from acute OT to increase overall independence in the areas of ADLs, functional mobility, and safety in order to safely discharge to next venue of care.    Follow Up Recommendations  SNF    Equipment Recommendations  Other (comment) Pt unable to state equipment he currently owns/uses.   Recommendations for Other Services       Precautions / Restrictions Precautions Precautions: Fall Restrictions Weight Bearing Restrictions: No      Mobility Bed Mobility Overal bed mobility: Needs Assistance Bed Mobility: Supine to Sit;Sit to Supine     Supine to sit: Mod assist     General bed mobility comments: Pt seated in recliner chair upon entering the room.   Transfers Overall transfer level: Needs assistance Equipment used: Rolling walker (2 wheeled) Transfers: Sit to/from Stand Sit to Stand: Max assist         General transfer comment: max A sit <>stand for lifting and lowering assistance. Max multimodal cues for hand placement.     Balance Overall balance assessment: Needs assistance Sitting-balance support: Bilateral upper extremity supported Sitting balance-Leahy Scale: Poor     Standing balance support: Bilateral upper extremity supported Standing balance-Leahy Scale: Poor Standing balance comment: reliance on RW                            ADL Overall ADL's : Needs assistance/impaired                                       General ADL Comments: Pt unable to follow commands for ADL tasks this session. Pt needing max multimodal cues for initiation and sequencing of tasks.      Vision     Perception     Praxis      Pertinent Vitals/Pain Pain Assessment: Faces Pain Score: 0-No pain     Hand Dominance Left   Extremity/Trunk Assessment Upper Extremity Assessment Upper Extremity Assessment: Difficult to assess due to impaired cognition   Lower Extremity Assessment Lower Extremity Assessment: Difficult to assess due to impaired cognition       Communication Communication Communication: Receptive difficulties;Expressive difficulties;HOH   Cognition Arousal/Alertness: Lethargic Behavior During Therapy: Flat affect;Impulsive Overall Cognitive Status: History of cognitive impairments - at baseline                 General Comments: Pt would answer "yes" to every question except when asked to stand. Pt needing coaxing for mobility this session.    General Comments       Exercises       Shoulder Instructions      Home Living Family/patient expects to be discharged to:: Private residence Living Arrangements: Other relatives Available Help at Discharge: Family;Available 24 hours/day Type of Home: House Home Access: Stairs to enter [employer] of Steps: 1 small step Entrance Stairs-Rails: None Home Layout:  One level     Bathroom Shower/Tub: Walk-in Pensions consultantshower;Curtain   Bathroom Toilet: Standard         Additional Comments: Per chart lives at home with family assistance. Patient unable to answer simple questioning about his house or prior function. Information obtained from prior hospital admission.      Prior Functioning/Environment Level of Independence: Needs assistance  Gait / Transfers Assistance Needed: Pt independently ambulating per sister report ( per  previous vists note)  ADL's / Homemaking Assistance Needed: Sister assists with bathing, sister assists with set-up for dressing, pt is independent for self-feeding, pt is independentl for toileting, but does not remember to change his brief  Communication / Swallowing Assistance Needed: Aox0 today.           OT Problem List: Decreased strength;Decreased activity tolerance;Impaired balance (sitting and/or standing);Decreased safety awareness;Decreased cognition;Decreased coordination;Decreased knowledge of use of DME or AE   OT Treatment/Interventions: Self-care/ADL training;Therapeutic exercise;Energy conservation;DME and/or AE instruction;Therapeutic activities;Manual therapy    OT Goals(Current goals can be found in the care plan section) Acute Rehab OT Goals Patient Stated Goal: unable to state ADL Goals Pt Will Perform Upper Body Bathing: with min assist Pt Will Perform Lower Body Bathing: with mod assist Pt Will Perform Upper Body Dressing: with min assist Pt Will Perform Lower Body Dressing: with mod assist Pt Will Transfer to Toilet: with min assist Pt Will Perform Toileting - Clothing Manipulation and hygiene: with min assist  OT Frequency: Min 2X/week   Barriers to D/C: Decreased caregiver support          Co-evaluation              End of Session Equipment Utilized During Treatment: Rolling walker  Activity Tolerance: Patient limited by lethargy Patient left: in chair;with call bell/phone within reach;with chair alarm set;with restraints reapplied   Time: [id number] OT Time Calculation (min): 12 min Charges:  OT Evaluation $OT Eval Moderate Complexity: 1 Procedure G-Codes:    Alen BleacherBradsher, Jarred Purtee P 05/31/2016, 2:45 PM

## 2016-06-01 LAB — BASIC METABOLIC PANEL
ANION GAP: 9 (ref 5–15)
BUN: 13 mg/dL (ref 6–20)
CO2: 25 mmol/L (ref 22–32)
Calcium: 8.8 mg/dL — ABNORMAL LOW (ref 8.9–10.3)
Chloride: 119 mmol/L — ABNORMAL HIGH (ref 101–111)
Creatinine, Ser: 0.81 mg/dL (ref 0.61–1.24)
GFR calc Af Amer: >60 mL/min (ref >60)
GFR calc non Af Amer: >60 mL/min (ref >60)
Glucose, Bld: 211 mg/dL — ABNORMAL HIGH (ref 65–99)
Potassium: 4.2 mmol/L (ref 3.5–5.1)
SODIUM: 153 mmol/L — AB (ref 135–145)

## 2016-06-01 LAB — CBC
HCT: 35 % — ABNORMAL LOW (ref 39.0–52.0)
HEMOGLOBIN: 10.6 g/dL — AB (ref 13.0–17.0)
MCH: 29 pg (ref 26.0–34.0)
MCHC: 30.3 g/dL (ref 30.0–36.0)
MCV: 95.6 fL (ref 78.0–100.0)
Platelets: 217 10*3/uL (ref 150–400)
RBC: 3.66 MIL/uL — ABNORMAL LOW (ref 4.22–5.81)
RDW: 14.9 % (ref 11.5–15.5)
WBC: 8.9 10*3/uL (ref 4.0–10.5)

## 2016-06-01 LAB — MAGNESIUM: MAGNESIUM: 2 mg/dL (ref 1.7–2.4)

## 2016-06-01 NOTE — NC FL2 (Signed)
[place] MEDICAID FL2 LEVEL OF CARE SCREENING TOOL     IDENTIFICATION  Patient Name: Eric CopaRobert Cromartie Birthdate: [birth year] Sex: male Admission Date (Current Location): 05/28/2016  [facility] [place]   Facility and Address:  [facility], [address], [place], [place] [address]      Provider Number: [id number]  Attending Physician Name and Address:  Penny Piarlando Vega, MD  Relative Name and Phone Number:  [name], sister, [phone]    Current Level of Care: Hospital Recommended Level of Care: Skilled Nursing Facility Prior Approval Number:    Date Approved/Denied:   PASRR Number: [id number] [id number]  Discharge Plan: SNF    Current Diagnoses: Patient Active Problem List   Diagnosis Date Noted  . Sepsis (HCC) 05/28/2016  . UTI (urinary tract infection) 05/28/2016  . Alzheimer's dementia without behavioral disturbance 04/10/2016  . Fungemia 07/18/2015  . Leukocytosis 07/18/2015  . Prolonged Q-T interval on ECG 07/18/2015  . Pressure ulcer of hip 07/06/2015  . PNA (pneumonia) 07/06/2015  . Elevated troponin 07/01/2015  . Dehydration 07/01/2015  . Seizures (HCC) 12/03/2013  . H/O: CVA (cerebrovascular accident) 12/01/2013  . Altered mental status 11/30/2013  . Acute encephalopathy 11/30/2013  . HTN (hypertension) 11/30/2013  . Diabetes mellitus type 2, uncontrolled (HCC) 11/30/2013  . Dementia 11/30/2013  . HLD (hyperlipidemia) 11/30/2013    Orientation RESPIRATION BLADDER Height & Weight     Self  O2 Continent, Indwelling catheter Weight: 54.3 kg (119 lb 11.2 oz) Height:     BEHAVIORAL SYMPTOMS/MOOD NEUROLOGICAL BOWEL NUTRITION STATUS      Continent Diet (Please see DC Summary)  AMBULATORY STATUS COMMUNICATION OF NEEDS Skin   Extensive Assist Verbally Normal                       Personal Care Assistance Level of Assistance  Bathing, Feeding, Dressing Bathing Assistance: Maximum assistance Feeding assistance:  Limited assistance Dressing Assistance: Maximum assistance     Functional Limitations Info             SPECIAL CARE FACTORS FREQUENCY  PT (By licensed PT)     PT Frequency: 5x/week              Contractures      Additional Factors Info  Code Status, Allergies Code Status Info: DNR Allergies Info: NKA           Current Medications (06/01/2016):  This is the current hospital active medication list Current Facility-Administered Medications  Medication Dose Route Frequency Provider Last Rate Last Dose  . acetaminophen (TYLENOL) tablet 650 mg  650 mg Oral Q6H PRN Michael LitterNikki Carter, MD       Or  . acetaminophen (TYLENOL) suppository 650 mg  650 mg Rectal Q6H PRN Michael LitterNikki Carter, MD   650 mg at 05/29/16 1655  . aspirin tablet 325 mg  325 mg Oral Daily Osvaldo ShipperGokul Krishnan, MD   325 mg at 06/01/16 0906  . atorvastatin (LIPITOR) tablet 20 mg  20 mg Oral q1800 Osvaldo ShipperGokul Krishnan, MD   20 mg at 05/31/16 1733  . cefpodoxime (VANTIN) tablet 200 mg  200 mg Oral Q12H Osvaldo ShipperGokul Krishnan, MD   200 mg at 06/01/16 0907  . citalopram (CELEXA) tablet 10 mg  10 mg Oral QPC supper Osvaldo ShipperGokul Krishnan, MD   10 mg at 05/31/16 1732  . dextrose 5 % with KCl 20 mEq / L  infusion  20 mEq Intravenous Continuous Osvaldo ShipperGokul Krishnan, MD 75 mL/hr at 06/01/16 [phone]  20 mEq at 06/01/16 0452  . lacosamide (VIMPAT) tablet 50 mg  50 mg Oral BID Osvaldo Shipper, MD   50 mg at 06/01/16 0907  . memantine (NAMENDA) tablet 10 mg  10 mg Oral BID Osvaldo Shipper, MD   10 mg at 06/01/16 0907  . ondansetron (ZOFRAN) tablet 4 mg  4 mg Oral Q6H PRN Michael Litter, MD       Or  . ondansetron [facility]) injection 4 mg  4 mg Intravenous Q6H PRN Michael Litter, MD      . sodium chloride flush (NS) 0.9 % injection 3 mL  3 mL Intravenous Q12H Michael Litter, MD   3 mL at 05/31/16 2150  . thiamine (VITAMIN B-1) tablet 100 mg  100 mg Oral Daily Osvaldo Shipper, MD   100 mg at 06/01/16 0907     Discharge Medications: Please see discharge summary for a list of  discharge medications.  Relevant Imaging Results:  Relevant Lab Results:   Additional Information SSN: [id number]  Mearl Latin, LCSWA

## 2016-06-01 NOTE — Progress Notes (Signed)
[facility] PROGRESS NOTE  Eric Cameron WUJ:[id number] DOB: [birth year] DOA: 05/28/2016  PCP: [name], MD  Brief History/Interval Summary: 82 year old African-American male with history of dementia, questionable history of seizure, hypertension and diabetes who lives with his brother and sister was brought into the hospital due to altered mental status and fever. Patient was found to have an abnormal UA suggesting UTI. There was evidence for sepsis. He was admitted to the hospital for further management.  Reason for Visit: Urinary tract infection with sepsis. Acute encephalopathy.  Consultants: None  Procedures:  EEG  Impression: This awake and drowsy EEG is mildly abnormal due to diffuse low voltage activity.  Antibiotics: On vancomycin and Zosyn. Vancomycin discontinued 2/12.  Subjective/Interval History: Patient much more awake and alert today. Still confused per family member.  ROS: Unable to do due to his encephalopathy  Objective:  Vital Signs  Vitals:   05/31/16 1506 05/31/16 2048 06/01/16 0442 06/01/16 1256  BP: 117/65 (!) 111/59 (!) 143/124 (!) 158/61  Pulse: 71 66 74 74  Resp: 16  18 18   Temp: 98 F (36.7 C) 98.7 F (37.1 C) 97.9 F (36.6 C) 98.6 F (37 C)  TempSrc: Oral  Oral Oral  SpO2: 98% 99% 98% 98%  Weight:        Intake/Output Summary (Last 24 hours) at 06/01/16 1700 Last data filed at 06/01/16 0447  Gross per 24 hour  Intake          1236.75 ml  Output              400 ml  Net           836.75 ml   Filed Weights   05/29/16 0230 05/30/16 1820  Weight: 55.8 kg (123 lb) 54.3 kg (119 lb 11.2 oz)    General appearance: Awake and alert today. In NAD Resp: clear to auscultation bilaterally, no wheezes, equal chest rise. Cardio: regular rate and rhythm, S1, S2 normal, no murmur, click, rub or gallop GI: soft, non-tender; bowel sounds normal; no masses,  no organomegaly Extremities: extremities normal, atraumatic, no cyanosis or  edema Neurologic: Awake and alert. Moves all extremities  Lab Results:  Data Reviewed: I have personally reviewed following labs and imaging studies  CBC:  Recent Labs Lab 05/28/16 1827 05/29/16 0535 05/30/16 0745 05/31/16 0556 06/01/16 0655  WBC 12.5* 12.4* 10.5 9.6 8.9  NEUTROABS 10.0* 8.1*  --   --   --   HGB 11.0* 9.6* 9.6* 10.0* 10.6*  HCT 35.1* 31.0* 31.3* 32.2* 35.0*  MCV 92.9 92.5 94.3 93.9 95.6  PLT 272 256 207 194 217    Basic Metabolic Panel:  Recent Labs Lab 05/28/16 1827 05/29/16 0535 05/29/16 1130 05/30/16 0745 05/31/16 0556 06/01/16 0655  NA 146* 149*  --  155* 152* 153*  K 3.3* 3.3*  --  3.6 2.9* 4.2  CL 103 110  --  119* 117* 119*  CO2 30 27  --  25 25 25   GLUCOSE 256* 196*  --  114* 201* 211*  BUN 17 13  --  17 15 13   CREATININE 0.80 0.74  --  0.95 0.82 0.81  CALCIUM 9.4 8.5*  --  8.3* 8.6* 8.8*  MG  --   --  1.5* 2.5*  --  2.0    GFR: Estimated Creatinine Clearance: 56.8 mL/min (by C-G formula based on SCr of 0.81 mg/dL).  Liver Function Tests:  Recent Labs Lab 05/28/16 1827  AST 37  ALT 47  ALKPHOS 83  BILITOT 0.4  PROT 8.2*  ALBUMIN 3.1*    Cardiac Enzymes:  Recent Labs Lab 05/28/16 2307 05/29/16 0535 05/29/16 0831  TROPONINI 0.04* 0.05* 0.05*     Recent Results (from the past 240 hour(s))  Culture, Urine     Status: Abnormal   Collection Time: 05/28/16  5:20 PM  Result Value Ref Range Status   Specimen Description URINE, RANDOM  Final   Special Requests ADDED AT 0108 ON [id number]  Final   Culture MULTIPLE SPECIES PRESENT, SUGGEST RECOLLECTION (A)  Final   Report Status 05/30/2016 FINAL  Final  Blood Culture (routine x 2)     Status: None (Preliminary result)   Collection Time: 05/28/16  6:27 PM  Result Value Ref Range Status   Specimen Description BLOOD LEFT FOREARM  Final   Special Requests BOTTLES DRAWN AEROBIC AND ANAEROBIC 5CC  Final   Culture NO GROWTH 4 DAYS  Final   Report Status PENDING  Incomplete    Blood Culture (routine x 2)     Status: None (Preliminary result)   Collection Time: 05/28/16  6:52 PM  Result Value Ref Range Status   Specimen Description BLOOD RIGHT HAND  Final   Special Requests IN PEDIATRIC BOTTLE 2CC  Final   Culture NO GROWTH 4 DAYS  Final   Report Status PENDING  Incomplete  MRSA PCR Screening     Status: None   Collection Time: 05/29/16  2:21 AM  Result Value Ref Range Status   MRSA by PCR NEGATIVE NEGATIVE Final    Comment:        The GeneXpert MRSA Assay (FDA approved for NASAL specimens only), is one component of a comprehensive MRSA colonization surveillance program. It is not intended to diagnose MRSA infection nor to guide or monitor treatment for MRSA infections.       Radiology Studies: No results found.   Medications:  Scheduled: . aspirin  325 mg Oral Daily  . atorvastatin  20 mg Oral q1800  . cefpodoxime  200 mg Oral Q12H  . citalopram  10 mg Oral QPC supper  . lacosamide  50 mg Oral BID  . memantine  10 mg Oral BID  . sodium chloride flush  3 mL Intravenous Q12H  . thiamine  100 mg Oral Daily   Continuous: . dextrose 5 % with KCl 20 mEq / L 20 mEq (06/01/16 0452)   EAV:[id number]:acetaminophen **OR** acetaminophen, ondansetron **OR** ondansetron (ZOFRAN) IV  Assessment/Plan:  Principal Problem:   Sepsis (HCC) Active Problems:   Acute encephalopathy   HTN (hypertension)   Diabetes mellitus type 2, uncontrolled (HCC)   Dementia   HLD (hyperlipidemia)   H/O: CVA (cerebrovascular accident)   Seizures (HCC)   UTI (urinary tract infection)    Acute encephalopathy attributed to sepsis, urine is the suspected source. CT head did not show any acute findings. Patient does not have any focal deficits on examination. Patient does have a history of dementia, which appears to be moderate to advanced. Patient's mental status has significantly improved. F/u with PT evaluation. Appreciate speech therapy input.   Sepsis likely secondary to  urinary tract infection UA noted to be abnormal. Unfortunately, no growth noted on urine cultures. Multiple species seen. Blood cultures still negative. Change to oral Vantin today. Stop Zosyn. Vancomycin discontinued on 2/12. Influenza PCR negative. Hasn't had any further episodes of fever. He could've had a viral syndrome as well.  Continue vantin  Mildly elevated troponin Likely due to sepsis. EKG  shows T-wave inversion in V3, V4, V5, but these have been noted previously as well. Troponin level trend has been flat. Due to his history of dementia, patient is not a candidate for further testing.  History of seizure D/o Discussed with his sister today. Apparently patient was on Vimpat up until April or May of last year. She thinks that the medication got dropped from his list when she changed his physician. There is no mention of side effects from this medicine. Patient will be placed back on Vimpat. EEG does not show any epileptiform activity. Stop Keppra.   Hypokalemia and hypomagnesemia Magnesium was repleted. Potassium WNL on last check.   Hypernatremia Slowly improving. Continue D5 water Monitor sodium levels.  Dementia Patient was on Namenda at home. This can be resumed. Patient seen by speech therapy. On dysphagia diet. Aspiration precautions.  DVT Prophylaxis: SCDs    Code Status: DO NOT RESUSCITATE  Family Communication: Discussed with the sister. Disposition Plan:  See management outlined above. PT evaluation.    LOS: 4 days   Penny Pia  [facility] Pager [phone] 06/01/2016, 5:00 PM  If 7PM-7AM, please contact night-coverage at [email or web address], password [facility]

## 2016-06-01 NOTE — Progress Notes (Signed)
Speech Language Pathology Treatment: Dysphagia  Patient Details Name: Eric Cameron MRN: [id number] DOB: 1934-12-04 Today's Date: 06/01/2016 Time: 4098-11911042-1103 SLP Time Calculation (min) (ACUTE ONLY): 21 min  Assessment / Plan / Recommendation Clinical Impression  Mr. Eric HarnessSimmons was alert but confused and needed moderate verbal, visual, and tactile cues to attend to POs and directions. He required feeding set-up and assistance due to visual deficits and impulsivity. Pt was observed with nectar thick liquids, thin liquids (for possible upgrade), and Dys (pureed) 1 solids which did not result in any s/s of airway compromise at bedside. Straws should not be utilized, as pt drinks too quickly. Oral phase impairments were noted, such as prolonged mastication and prolonged oral transit during trials of regular solids. Educated Eric Cameron re: diet recommendations-upgrade from nectar thick liquids to thin liquids (no straws), continue with Dys 1 solids (due to decreased awareness), meds crushed in puree; emphasized slow rate and small sips/bites. ST will f/u briefly for treatment for swallow precautions and diet tolerance.   HPI HPI: 82 year old male admitted 05/28/16 due to fever and AMS. PMH significant for dementia, seizures, HTN, DM, PNA, CVA, UTI.       SLP Plan  Continue with current plan of care     Recommendations  Diet recommendations: Dysphagia 1 (puree);Thin liquid Liquids provided via: Cup;No straw Medication Administration: Crushed with puree Supervision: Full supervision/cueing for compensatory strategies Compensations: Slow rate;Small sips/bites;Minimize environmental distractions Postural Changes and/or Swallow Maneuvers: Seated upright 90 degrees                Oral Care Recommendations: Oral care BID Follow up Recommendations: 24 hour supervision/assistance;Skilled Nursing facility Plan: Continue with current plan of care       GO                Eric CritchleyMeredith Abbi Cameron ,  Student-SLP 06/01/2016, 11:25 AM

## 2016-06-02 DIAGNOSIS — R569 Unspecified convulsions: Secondary | ICD-10-CM

## 2016-06-02 DIAGNOSIS — A419 Sepsis, unspecified organism: Principal | ICD-10-CM

## 2016-06-02 LAB — CULTURE, BLOOD (ROUTINE X 2)
Culture: NO GROWTH
Culture: NO GROWTH

## 2016-06-02 NOTE — Care Management Important Message (Signed)
Important Message  Patient Details  Name: Eric Cameron MRN: [id number] Date of Birth: 1934-09-11   Medicare Important Message Given:  Yes    Kyla BalzarineShealy, Nameer Summer Abena 06/02/2016, 10:44 AM

## 2016-06-02 NOTE — Clinical Social Work Note (Signed)
Clinical Social Work Assessment  Patient Details  Name: Eric Cameron MRN: [id number] Date of Birth: November 30, 1934  Date of referral:  06/02/16               Reason for consult:  Facility Placement                Permission sought to share information with:  Facility Medical sales representativeContact Representative, Family Supports Permission granted to share information::  No  Name::     Company secretaryleanor  Agency::  SNFs  Relationship::  Sister  Contact Information:     Housing/Transportation Living arrangements for the past 2 months:  Single Family Home Source of Information:  Other (Comment Required) (Sister) Patient Interpreter Needed:  None Criminal Activity/Legal Involvement Pertinent to Current Situation/Hospitalization:  No - Comment as needed Significant Relationships:  Siblings Lives with:  Siblings Do you feel safe going back to the place where you live?  No Need for family participation in patient care:  Yes (Comment)  Care giving concerns:  CSW received consult for possible SNF placement at time of discharge. Patient is disoriented. CSW spoke with patient's sister regarding PT recommendation of SNF placement at time of discharge. Patient's sister reported that patient lives with her but he needs rehab. Patient's sister expressed understanding of PT recommendation and is agreeable to SNF placement at time of discharge. CSW to continue to follow and assist with discharge planning needs.  Social Worker assessment / plan:  CSW spoke with patient's sister [facility].  Employment status:  Retired Health and safety inspectornsurance information:  Armed forces operational officerMedicare, Medicaid In [employer] PT Recommendations:  Skilled Nursing Facility Information / Referral to community resources:  Skilled Nursing Facility  Patient/Family's Response to care:  Patient's sister recognizes need for rehab before returning home and [place]. Patient has been to [facility]  before.  Patient/Family's Understanding of and Emotional Response to Diagnosis, Current Treatment, and Prognosis:  Patient/family is realistic regarding therapy needs and expressed being hopeful for SNF placement. Patient's sister expressed understanding of CSW role and discharge process. No questions/concerns about plan or treatment.    Emotional Assessment Appearance:  Appears stated age Attitude/Demeanor/Rapport:  Unable to Assess Affect (typically observed):  Unable to Assess Orientation:  Oriented to Self, Oriented to Place Alcohol / Substance use:  Not Applicable Psych involvement (Current and /or in the community):  No (Comment)  Discharge Needs  Concerns to be addressed:  Care Coordination Readmission within the last 30 days:  No Current discharge risk:  Cognitively Impaired Barriers to Discharge:  Continued Medical Work up   [employer] S Kisha Messman, Theresia MajorsLCSWA 06/02/2016, 4:39 PM

## 2016-06-02 NOTE — Plan of Care (Signed)
Problem: Education: Goal: Knowledge of [place] [facility] Education information/materials will improve Outcome: Not Progressing Pt remains confused and unable to participate in education

## 2016-06-02 NOTE — Progress Notes (Signed)
[facility] PROGRESS NOTE  Cornelia CopaRobert Barlett NWG:[id number] DOB: 06/16/1934 DOA: 05/28/2016  PCP: Merrilee SeashoreAnne Alexander, MD  Brief History/Interval Summary: 82 year old African-American male with history of dementia, questionable history of seizure, hypertension and diabetes who lives with his brother and sister was brought into the hospital due to altered mental status and fever. Patient was found to have an abnormal UA suggesting UTI. There was evidence for sepsis. He was admitted to the hospital for further management.  Reason for Visit: Urinary tract infection with sepsis. Acute encephalopathy.  Consultants: None  Procedures:  EEG  Impression: This awake and drowsy EEG is mildly abnormal due to diffuse low voltage activity.  Antibiotics: On vancomycin and Zosyn. Vancomycin discontinued 2/12.  Subjective/Interval History: Patient awake and alert. Sitting up in bed. No new complaints reported. Still confused  ROS: Unable to do due to his encephalopathy  Objective:  Vital Signs  Vitals:   06/01/16 1256 06/02/16 0530 06/02/16 1338 06/02/16 1450  BP: (!) 158/61 116/66 (!) 88/59 122/89  Pulse: 74 81 86   Resp: 18 18    Temp: 98.6 F (37 C) 97.4 F (36.3 C) 98.2 F (36.8 C)   TempSrc: Oral Oral Oral   SpO2: 98% 100% 98%   Weight:        Intake/Output Summary (Last 24 hours) at 06/02/16 1736 Last data filed at 06/02/16 1030  Gross per 24 hour  Intake              420 ml  Output             2075 ml  Net            -1655 ml   Filed Weights   05/29/16 0230 05/30/16 1820  Weight: 55.8 kg (123 lb) 54.3 kg (119 lb 11.2 oz)    General appearance: Awake and alert today. In NAD Resp: clear to auscultation bilaterally, no wheezes, equal chest rise. Cardio: regular rate and rhythm, S1, S2 normal, no murmur, click, rub or gallop GI: soft, non-tender; bowel sounds normal; no masses,  no organomegaly Extremities: extremities normal, atraumatic, no cyanosis or  edema Neurologic: Awake and alert. Moves all extremities  Lab Results:  Data Reviewed: I have personally reviewed following labs and imaging studies  CBC:  Recent Labs Lab 05/28/16 1827 05/29/16 0535 05/30/16 0745 05/31/16 0556 06/01/16 0655  WBC 12.5* 12.4* 10.5 9.6 8.9  NEUTROABS 10.0* 8.1*  --   --   --   HGB 11.0* 9.6* 9.6* 10.0* 10.6*  HCT 35.1* 31.0* 31.3* 32.2* 35.0*  MCV 92.9 92.5 94.3 93.9 95.6  PLT 272 256 207 194 217    Basic Metabolic Panel:  Recent Labs Lab 05/28/16 1827 05/29/16 0535 05/29/16 1130 05/30/16 0745 05/31/16 0556 06/01/16 0655  NA 146* 149*  --  155* 152* 153*  K 3.3* 3.3*  --  3.6 2.9* 4.2  CL 103 110  --  119* 117* 119*  CO2 30 27  --  25 25 25   GLUCOSE 256* 196*  --  114* 201* 211*  BUN 17 13  --  17 15 13   CREATININE 0.80 0.74  --  0.95 0.82 0.81  CALCIUM 9.4 8.5*  --  8.3* 8.6* 8.8*  MG  --   --  1.5* 2.5*  --  2.0    GFR: Estimated Creatinine Clearance: 56.8 mL/min (by C-G formula based on SCr of 0.81 mg/dL).  Liver Function Tests:  Recent Labs Lab 05/28/16 1827  AST 37  ALT  47  ALKPHOS 83  BILITOT 0.4  PROT 8.2*  ALBUMIN 3.1*    Cardiac Enzymes:  Recent Labs Lab 05/28/16 2307 05/29/16 0535 05/29/16 0831  TROPONINI 0.04* 0.05* 0.05*     Recent Results (from the past 240 hour(s))  Culture, Urine     Status: Abnormal   Collection Time: 05/28/16  5:20 PM  Result Value Ref Range Status   Specimen Description URINE, RANDOM  Final   Special Requests ADDED AT 0108 ON [id number]  Final   Culture MULTIPLE SPECIES PRESENT, SUGGEST RECOLLECTION (A)  Final   Report Status 05/30/2016 FINAL  Final  Blood Culture (routine x 2)     Status: None   Collection Time: 05/28/16  6:27 PM  Result Value Ref Range Status   Specimen Description BLOOD LEFT FOREARM  Final   Special Requests BOTTLES DRAWN AEROBIC AND ANAEROBIC 5CC  Final   Culture NO GROWTH 5 DAYS  Final   Report Status 06/02/2016 FINAL  Final  Blood Culture  (routine x 2)     Status: None   Collection Time: 05/28/16  6:52 PM  Result Value Ref Range Status   Specimen Description BLOOD RIGHT HAND  Final   Special Requests IN PEDIATRIC BOTTLE 2CC  Final   Culture NO GROWTH 5 DAYS  Final   Report Status 06/02/2016 FINAL  Final  MRSA PCR Screening     Status: None   Collection Time: 05/29/16  2:21 AM  Result Value Ref Range Status   MRSA by PCR NEGATIVE NEGATIVE Final    Comment:        The GeneXpert MRSA Assay (FDA approved for NASAL specimens only), is one component of a comprehensive MRSA colonization surveillance program. It is not intended to diagnose MRSA infection nor to guide or monitor treatment for MRSA infections.       Radiology Studies: No results found.   Medications:  Scheduled: . aspirin  325 mg Oral Daily  . atorvastatin  20 mg Oral q1800  . cefpodoxime  200 mg Oral Q12H  . citalopram  10 mg Oral QPC supper  . lacosamide  50 mg Oral BID  . memantine  10 mg Oral BID  . sodium chloride flush  3 mL Intravenous Q12H  . thiamine  100 mg Oral Daily   Continuous: . dextrose 5 % with KCl 20 mEq / L 20 mEq (06/02/16 0253)   EAV:[id number] **OR** acetaminophen, ondansetron **OR** ondansetron (ZOFRAN) IV  Assessment/Plan:  Principal Problem:   Sepsis (HCC) Active Problems:   Acute encephalopathy   HTN (hypertension)   Diabetes mellitus type 2, uncontrolled (HCC)   Dementia   HLD (hyperlipidemia)   H/O: CVA (cerebrovascular accident)   Seizures (HCC)   UTI (urinary tract infection)    Acute encephalopathy attributed to sepsis, urine is the suspected source. CT head did not show any acute findings. Patient does not have any focal deficits on examination. Patient does have a history of dementia, which appears to be moderate to advanced. Patient's mental status has significantly improved. F/u with PT evaluation. Appreciate speech therapy input.   Sepsis likely secondary to urinary tract infection UA noted  to be abnormal. Unfortunately, no growth noted on urine cultures. Multiple species seen. Blood cultures still negative. Change to oral Vantin today. Stop Zosyn. Vancomycin discontinued on 2/12. Influenza PCR negative. Hasn't had any further episodes of fever. He could've had a viral syndrome as well.  Continue vantin  Mildly elevated troponin Likely due to sepsis. EKG shows  T-wave inversion in V3, V4, V5, but these have been noted previously as well. Troponin level trend has been flat. Due to his history of dementia, patient is not a candidate for further testing.  History of seizure D/o Discussed with his sister today. Apparently patient was on Vimpat up until April or May of last year. She thinks that the medication got dropped from his list when she changed his physician. There is no mention of side effects from this medicine. Patient will be placed back on Vimpat. EEG does not show any epileptiform activity. Stop Keppra.   Hypokalemia and hypomagnesemia Magnesium was repleted. Potassium WNL on last check.   Hypernatremia Slowly improving. Continue D5 water Monitor sodium levels.  Dementia Patient was on Namenda at home. This can be resumed. Patient seen by speech therapy. On dysphagia diet. Aspiration precautions.  DVT Prophylaxis: SCDs    Code Status: DO NOT RESUSCITATE  Family Communication: Discussed with the sister. Disposition Plan:  See management outlined above. PT evaluation.    LOS: 5 days   Eric Cameron  [facility] Pager [phone] 06/02/2016, 5:36 PM  If 7PM-7AM, please contact night-coverage at [email or web address], password [facility]

## 2016-06-02 NOTE — Progress Notes (Signed)
Occupational Therapy Treatment Patient Details Name: Eric CopaRobert Raybuck MRN: [id number] DOB: [birth year] Today's Date: 06/02/2016    History of present illness Patient presents to the hospital with Sepsis. He has advanced dementia. PMH: HTN, pressure ulcer, elevated troponin, dementia   OT comments  Pt able to perform stand pivot transfer to chair today with min hand held assist and max verbal cues for sequencing and safety. Pt required min guard for bed mobility, max assist for LB dressing, and min assist for UB dressing sitting EOB. Pt continues to be confused and lethargic but does arouse with auditory stimulus and follows one step commands consistently. D/c plan remains appropriate. Will continue to follow acutely.   Follow Up Recommendations  SNF    Equipment Recommendations  Other (comment) (TBD at next venue)    Recommendations for Other Services      Precautions / Restrictions Precautions Precautions: Fall Restrictions Weight Bearing Restrictions: No       Mobility Bed Mobility Overal bed mobility: Needs Assistance Bed Mobility: Supine to Sit     Supine to sit: Min guard     General bed mobility comments: Min guard for safety with increased time and cues for sequencing and initiation   Transfers Overall transfer level: Needs assistance Equipment used: 1 person hand held assist Transfers: Sit to/from [employer] CorporationStand;Stand Pivot Transfers Sit to Stand: Min assist Stand pivot transfers: Min assist       General transfer comment: Min assist for balance and for stand pivot transfer. Cues for sequencing and safety.    Balance Overall balance assessment: Needs assistance Sitting-balance support: Feet supported;No upper extremity supported Sitting balance-Leahy Scale: Fair     Standing balance support: Bilateral upper extremity supported Standing balance-Leahy Scale: Poor                     ADL Overall ADL's : Needs assistance/impaired Eating/Feeding: Set  up;Sitting Eating/Feeding Details (indicate cue type and reason): Pt self feeding at end of session             Upper Body Dressing : Minimal assistance;Sitting   Lower Body Dressing: Maximal assistance Lower Body Dressing Details (indicate cue type and reason): Pt attempting to don socks sitting EOB but with difficulty, provided max assist. Toilet Transfer: Minimal assistance;Cueing for safety;Cueing for sequencing;Stand-pivot;BSC Toilet Transfer Details (indicate cue type and reason): Simulated by stand pivot to chair         Functional mobility during ADLs: Minimal assistance (hand held assist for stand pivot only)        Vision                     Perception     Praxis      Cognition   Behavior During Therapy: Flat affect Overall Cognitive Status: No family/caregiver present to determine baseline cognitive functioning                       Extremity/Trunk Assessment               Exercises     Shoulder Instructions       General Comments      Pertinent Vitals/ Pain       Pain Assessment: No/denies pain  Home Living  Prior Functioning/Environment              Frequency  Min 2X/week        Progress Toward Goals  OT Goals(current goals can now be found in the care plan section)  Progress towards OT goals: Progressing toward goals  Acute Rehab OT Goals Patient Stated Goal: get something to eat  Plan Discharge plan remains appropriate    Co-evaluation                 End of Session     Activity Tolerance Patient tolerated treatment well   Patient Left in chair;with call bell/phone within reach;with chair alarm set   Nurse Communication Mobility status;Other (comment) (condom cath off)        Time: 1610-9604 OT Time Calculation (min): 12 min  Charges: OT General Charges $OT Visit: 1 Procedure OT Treatments $Self Care/Home Management : 8-22  mins  Gaye Alken  M.S., OTR/L Pager: [phone]  06/02/2016, 4:38 PM

## 2016-06-03 MED ORDER — CEFPODOXIME PROXETIL 200 MG PO TABS: 200.0000 mg | ORAL_TABLET | Freq: Two times a day (BID) | ORAL | 0 refills | Status: AC

## 2016-06-03 NOTE — Clinical Social Work Placement (Signed)
   CLINICAL SOCIAL WORK PLACEMENT  NOTE  Date:  06/03/2016  Patient Details  Name: Eric Cameron MRN: [id number] Date of Birth: Jul 13, 1934  Clinical Social Work is seeking post-discharge placement for this patient at the Skilled  Nursing Facility level of care (*CSW will initial, date and re-position this form in  chart as items are completed):  Yes   Patient/family provided with [facility] Clinical Social Work Department's list of facilities offering this level of care within the geographic area requested by the patient (or if unable, by the patient's family).  Yes   Patient/family informed of their freedom to choose among providers that offer the needed level of care, that participate in Medicare, Medicaid or managed care program needed by the patient, have an available bed and are willing to accept the patient.  Yes   Patient/family informed of [facility]'s ownership interest in [facility] and [facility], as well as of the fact that they are under no obligation to receive care at these facilities.  PASRR submitted to EDS on       PASRR number received on       Existing PASRR number confirmed on 06/01/16     FL2 transmitted to all facilities in geographic area requested by pt/family on 06/01/16     FL2 transmitted to all facilities within larger geographic area on       Patient informed that his/her managed care company has contracts with or will negotiate with certain facilities, including the following:        Yes   Patient/family informed of bed offers received.  [facility]     Physician recommends and patient chooses bed at      Patient to be transferred to [facility] on 06/03/16.  Patient to be transferred to facility by  Sharin Mons([facility])     Patient family notified on 06/03/16 of transfer.  Name of family member notified:  Sister     PHYSICIAN Please sign FL2     Additional Comment:     _______________________________________________ [name], [name], LCSWA 06/03/2016, 3:23 PM

## 2016-06-03 NOTE — Clinical Social Work Note (Signed)
Clinical Social Worker facilitated patient discharge including contacting patient family and facility to confirm patient discharge plans.  Clinical information faxed to facility and family agreeable with plan.  CSW arranged ambulance transport via [facility]. RN to call report [phone] prior to discharge.  Clinical Social Worker will sign off for now as social work intervention is no longer needed. Please consult us again if new need arises.  Shawndale Kilpatrick B. Gean QuintBrown,MSW, LCSWA Clinical Social Work Dept Weekend Social Worker [phone] 2:56 PM

## 2016-06-03 NOTE — Discharge Summary (Signed)
Physician Discharge Summary  Courtney Fenlon RUE:[id number] DOB: [birth year] DOA: 05/28/2016  PCP: [name], MD  Admit date: 05/28/2016 Discharge date: 06/03/2016  Time spent: > 35  minutes  Recommendations for Outpatient Follow-up:  1. Ensure patient adequate Blood sugar control 2. Continue antibiotic therapy 3. Monitor sodium levels   Discharge Diagnoses:  Principal Problem:   Sepsis (HCC) Active Problems:   Acute encephalopathy   HTN (hypertension)   Diabetes mellitus type 2, uncontrolled (HCC)   Dementia   HLD (hyperlipidemia)   H/O: CVA (cerebrovascular accident)   Seizures (HCC)   UTI (urinary tract infection)   Discharge Condition: stable  Diet recommendation: dysphagia 1  Filed Weights   05/29/16 0230 05/30/16 1820  Weight: 55.8 kg (123 lb) 54.3 kg (119 lb 11.2 oz)    History of present illness:   82 y.o. gentleman with a history of dementia, seizure (though he is not on any AEDs), HTN, and DM who lives at home with a brother and sister who take care of him.  Patient presented with altered mental status secondary to UTI  Hospital Course:  Acute encephalopathy attributed to sepsis, urine is the suspected source. CT head did not show any acute findings. Patient does not have any focal deficits on examination. Patient does have a history of dementia, which appears to be moderate to advanced. Patient's mental status has significantly improved on antibiotics will continue.   Sepsis likely secondary to urinary tract infection UA noted to be abnormal. Unfortunately, no growth noted on urine cultures. Multiple species seen. Blood cultures still negative. Change to oral Vantin today. Stop Zosyn. Vancomycin discontinued on 2/12. Influenza PCR negative. Hasn't had any further episodes of fever. He could've had a viral syndrome as well.  Continue vantin for prolonged course 10 days and otherwise 3 more days after day of discharge  Mildly elevated troponin Likely due to  sepsis. EKG shows T-wave inversion in V3, V4, V5, but these have been noted previously as well. Troponin level trend has been flat. Due to his history of dementia, patient is not a candidate for further testing.  History of seizure D/o Discussed with his sister today. Apparently patient was on Vimpat up until April or May of last year. She thinks that the medication got dropped from his list when she changed his physician. There is no mention of side effects from this medicine. Patient will be placed back on Vimpat. EEG does not show any epileptiform activity. Stop Keppra.   Hypokalemia and hypomagnesemia Magnesium was repleted. Potassium WNL on last check.   Hypernatremia Slowly improving. We'll recommend continued monitoring after discharge  Dementia Patient was on Namenda at home.   Procedures:  None  Consultations:  None  Discharge Exam: Vitals:   06/03/16 0615 06/03/16 0625  BP: 107/68 99/66  Pulse: 91 86  Resp:    Temp:      General: Pt in nad, alert and awake Cardiovascular: rrr, no rubs Respiratory: no increased wob, no wheezes  Discharge Instructions   Discharge Instructions    Call MD for:  difficulty breathing, headache or visual disturbances    Complete by:  As directed    Call MD for:  temperature >100.4    Complete by:  As directed    Diet - low sodium heart healthy    Complete by:  As directed    Discharge instructions    Complete by:  As directed    Pt to continue to have monitoring by pcp at facility  Increase activity slowly    Complete by:  As directed      Current Discharge Medication List    START taking these medications   Details  cefpodoxime (VANTIN) 200 MG tablet Take 1 tablet (200 mg total) by mouth every 12 (twelve) hours. Qty: 6 tablet, Refills: 0      CONTINUE these medications which have NOT CHANGED   Details  aspirin 325 MG tablet Take 1 tablet (325 mg total) by mouth daily.    atorvastatin (LIPITOR) 20 MG tablet Take  20 mg by mouth daily. Refills: 1    Cholecalciferol (VITAMIN D PO) Take 1 tablet by mouth daily.    citalopram (CELEXA) 10 MG tablet Take 10 mg by mouth daily after supper.     folic acid (FOLVITE) 1 MG tablet Take 1 tablet (1 mg total) by mouth daily.    memantine (NAMENDA) 10 MG tablet Take 10 mg by mouth 2 (two) times daily.     thiamine 100 MG tablet Take 1 tablet (100 mg total) by mouth daily.      STOP taking these medications     lisinopril (PRINIVIL,ZESTRIL) 2.5 MG tablet        No Known Allergies    The results of significant diagnostics from this hospitalization (including imaging, microbiology, ancillary and laboratory) are listed below for reference.    Significant Diagnostic Studies: Ct Head Wo Contrast  Result Date: 05/29/2016 CLINICAL DATA:  Acute encephalopathy. History of seizures, hypertension, diabetes, dementia, and Alzheimer's disease. EXAM: CT HEAD WITHOUT CONTRAST TECHNIQUE: Contiguous axial images were obtained from the base of the skull through the vertex without intravenous contrast. COMPARISON:  MRI brain 07/02/2015.  CT head 07/01/2015. FINDINGS: Brain: Diffuse cerebral atrophy. Ventricular dilatation consistent with central atrophy. Patchy low-attenuation changes in the deep white matter consistent with small vessel ischemia. Prominent calcification of the falx. No mass effect or midline shift. No abnormal extra-axial fluid collections. Gray-white matter junctions are distinct. Basal cisterns are not effaced. No acute intracranial hemorrhage. Vascular: Vascular calcifications are present peer Skull: Normal. Negative for fracture or focal lesion. Sinuses/Orbits: No acute finding. Other: No significant changes since previous study. IMPRESSION: No acute intracranial abnormalities. Mild chronic atrophy and small vessel ischemic changes. Electronically Signed   By: Burman Nieves M.D.   On: 05/29/2016 00:37   Dg Chest Port 1 View  Result Date:  05/28/2016 CLINICAL DATA:  High fever and altered mental status. Patient was uncooperative. EXAM: PORTABLE CHEST 1 VIEW COMPARISON:  05/28/2016 at 1801 hours FINDINGS: Shallow inspiration. Heart size and pulmonary vascularity are normal. No focal airspace disease or consolidation in the lungs. No pneumothorax. Calcified and tortuous aorta. IMPRESSION: No evidence of active pulmonary disease. Electronically Signed   By: Burman Nieves M.D.   On: 05/28/2016 23:23   Dg Chest Port 1 View  Result Date: 05/28/2016 CLINICAL DATA:  Acute mental status change.  Fever. EXAM: PORTABLE CHEST 1 VIEW COMPARISON:  July 04, 2015 FINDINGS: The study is markedly limited due to positioning. The patient was unable/unwilling to cooperate. Within this limitation, no pneumothorax. The right lateral lower lung was not imaged. Within visualize limits, no pneumonia identified. The cardiomediastinal silhouette is unchanged given difference in positioning. IMPRESSION: Markedly limited study as above.  No acute abnormalities are seen. Electronically Signed   By: Gerome Sam III M.D   On: 05/28/2016 18:15    Microbiology: Recent Results (from the past 240 hour(s))  Culture, Urine     Status: Abnormal  Collection Time: 05/28/16  5:20 PM  Result Value Ref Range Status   Specimen Description URINE, RANDOM  Final   Special Requests ADDED AT 0108 ON [id number]  Final   Culture MULTIPLE SPECIES PRESENT, SUGGEST RECOLLECTION (A)  Final   Report Status 05/30/2016 FINAL  Final  Blood Culture (routine x 2)     Status: None   Collection Time: 05/28/16  6:27 PM  Result Value Ref Range Status   Specimen Description BLOOD LEFT FOREARM  Final   Special Requests BOTTLES DRAWN AEROBIC AND ANAEROBIC 5CC  Final   Culture NO GROWTH 5 DAYS  Final   Report Status 06/02/2016 FINAL  Final  Blood Culture (routine x 2)     Status: None   Collection Time: 05/28/16  6:52 PM  Result Value Ref Range Status   Specimen Description BLOOD RIGHT HAND   Final   Special Requests IN PEDIATRIC BOTTLE 2CC  Final   Culture NO GROWTH 5 DAYS  Final   Report Status 06/02/2016 FINAL  Final  MRSA PCR Screening     Status: None   Collection Time: 05/29/16  2:21 AM  Result Value Ref Range Status   MRSA by PCR NEGATIVE NEGATIVE Final    Comment:        The GeneXpert MRSA Assay (FDA approved for NASAL specimens only), is one component of a comprehensive MRSA colonization surveillance program. It is not intended to diagnose MRSA infection nor to guide or monitor treatment for MRSA infections.      Labs: Basic Metabolic Panel:  Recent Labs Lab 05/28/16 1827 05/29/16 0535 05/29/16 1130 05/30/16 0745 05/31/16 0556 06/01/16 0655  NA 146* 149*  --  155* 152* 153*  K 3.3* 3.3*  --  3.6 2.9* 4.2  CL 103 110  --  119* 117* 119*  CO2 30 27  --  25 25 25   GLUCOSE 256* 196*  --  114* 201* 211*  BUN 17 13  --  17 15 13   CREATININE 0.80 0.74  --  0.95 0.82 0.81  CALCIUM 9.4 8.5*  --  8.3* 8.6* 8.8*  MG  --   --  1.5* 2.5*  --  2.0   Liver Function Tests:  Recent Labs Lab 05/28/16 1827  AST 37  ALT 47  ALKPHOS 83  BILITOT 0.4  PROT 8.2*  ALBUMIN 3.1*   No results for input(s): LIPASE, AMYLASE in the last 168 hours. No results for input(s): AMMONIA in the last 168 hours. CBC:  Recent Labs Lab 05/28/16 1827 05/29/16 0535 05/30/16 0745 05/31/16 0556 06/01/16 0655  WBC 12.5* 12.4* 10.5 9.6 8.9  NEUTROABS 10.0* 8.1*  --   --   --   HGB 11.0* 9.6* 9.6* 10.0* 10.6*  HCT 35.1* 31.0* 31.3* 32.2* 35.0*  MCV 92.9 92.5 94.3 93.9 95.6  PLT 272 256 207 194 217   Cardiac Enzymes:  Recent Labs Lab 05/28/16 2307 05/29/16 0535 05/29/16 0831  TROPONINI 0.04* 0.05* 0.05*   BNP: BNP (last 3 results) No results for input(s): BNP in the last 8760 hours.  ProBNP (last 3 results) No results for input(s): PROBNP in the last 8760 hours.  CBG: No results for input(s): GLUCAP in the last 168 hours.  Signed:  Penny PiaVEGA, Jaylnn Ullery MD.   [facility] 06/03/2016, 3:21 PM

## 2016-06-03 NOTE — Progress Notes (Signed)
CSW spoke with patient's sister, [name]. She would like patient to discharge to [facility] since he has been there before. [facility] is able to accept patient.  Osborne Cascoadia Luismanuel Corman LCSWA [phone]

## 2016-06-03 NOTE — Progress Notes (Signed)
PT Cancellation Note  Patient Details Name: Cornelia CopaRobert Ramella MRN: [id number] DOB: 29-Jan-1935   Cancelled Treatment:    Reason Eval/Treat Not Completed: Other (comment) (Pt currently receiving bath from nursing; Pt d/c to SNF at 5)   Ayesha RumpfMary Moody Jaelen Soth 06/03/2016, 4:28 PM Kerrin MoMary M Ayano [place], [place] Pager [phone]

## 2016-06-03 NOTE — Progress Notes (Signed)
Called report to WilderDenise at [facility] on patient.

## 2016-06-06 ENCOUNTER — Encounter: Payer: Self-pay | Admitting: Internal Medicine

## 2016-06-06 ENCOUNTER — Telehealth: Payer: Self-pay

## 2016-06-06 ENCOUNTER — Non-Acute Institutional Stay (SKILLED_NURSING_FACILITY): Payer: Medicaid Other | Admitting: Internal Medicine

## 2016-06-06 DIAGNOSIS — G309 Alzheimer's disease, unspecified: Secondary | ICD-10-CM

## 2016-06-06 DIAGNOSIS — E878 Other disorders of electrolyte and fluid balance, not elsewhere classified: Secondary | ICD-10-CM | POA: Diagnosis not present

## 2016-06-06 DIAGNOSIS — E119 Type 2 diabetes mellitus without complications: Secondary | ICD-10-CM | POA: Diagnosis not present

## 2016-06-06 DIAGNOSIS — A419 Sepsis, unspecified organism: Secondary | ICD-10-CM

## 2016-06-06 DIAGNOSIS — F329 Major depressive disorder, single episode, unspecified: Secondary | ICD-10-CM | POA: Diagnosis not present

## 2016-06-06 DIAGNOSIS — F0281 Dementia in other diseases classified elsewhere with behavioral disturbance: Secondary | ICD-10-CM

## 2016-06-06 DIAGNOSIS — E782 Mixed hyperlipidemia: Secondary | ICD-10-CM | POA: Diagnosis not present

## 2016-06-06 DIAGNOSIS — G308 Other Alzheimer's disease: Secondary | ICD-10-CM

## 2016-06-06 DIAGNOSIS — F32A Depression, unspecified: Secondary | ICD-10-CM

## 2016-06-06 DIAGNOSIS — R5381 Other malaise: Secondary | ICD-10-CM

## 2016-06-06 DIAGNOSIS — R627 Adult failure to thrive: Secondary | ICD-10-CM

## 2016-06-06 DIAGNOSIS — I10 Essential (primary) hypertension: Secondary | ICD-10-CM | POA: Diagnosis not present

## 2016-06-06 NOTE — Telephone Encounter (Signed)
This is a patient of [facility], who was admitted to [facility] after [facility]. Hospital [facility] on 06/03/16.

## 2016-06-06 NOTE — Progress Notes (Signed)
Patient ID: Eric Cameron, male   DOB: 01/02/1935, 82 y.o.   MRN: [id number]    HISTORY AND PHYSICAL   DATE: 06/06/2016  Location:    [facility] Room Number: 629 B Place of Service: SNF (31)   Extended Emergency Contact Information Primary Emergency Contact: [name],[name] Address: [address]          [place] [address] [place] Phone: [phone] Mobile Phone: [phone] Relation: Sister  Advanced Directive information Does Patient Have a Medical Advance Directive?: Yes, Type of Advance Directive: Out of facility DNR (pink MOST or yellow form), Pre-existing out of facility DNR order (yellow form or pink MOST form): Yellow form placed in chart (order not valid for inpatient use)  Chief Complaint  Patient presents with  . New Admit To SNF    HPI:  82 yo male seen today as a new admission into SNF following hospital stay for sepsis, acute encephalopathy, UTI, hypernatremia, elevated Troponin, hypomagnesmia, hypokalemia, dementia, DM, HTN, sz d/o, hx CVA, hyperlipidemia. CT head showed no acute process. Urine cx revealed multiple spp. Blood cx NGTD. Influenza panel neg. He was tx initally with IV zosyn and vanco --> po vantin x 10 days total. His sx meds were adjusted. electrolytes repleted and Na corrected. Na 146-->155-->153; K 3.3-->4.2; albumin 3.1; WBC 12.5K--> 8.9K; abs neutrophils 10K -->8.1K; Hgb 11-->10.6; Mg 1.5-->2; Cr 0.81 at d/c.  He presents to SNF for short term rehab.  Today he reports no concerns. Nu nursing issues. No falls. He is a poor historian due to dementia. Hx obtained from chart  Alzheimer's dementia/FTT - albumin 3.1. Weight 108 lbs. Takes namenda. Mood stable on celexa. He takes thiamine/folate  Depression - stable on celexa  DM - diet controlled. A1c 5.5% in 06/2015  HTN - diet controlled.   sz d/o - recent EEG revealed no epileptic changes. He does not take any meds for sz  Hyperlipidemia - takes lipitor  daily. No recent LDL  Hx CVA - stable. Takes ASA daily and statin  Past Medical History:  Diagnosis Date  . Acute encephalopathy   . Alzheimer disease   . Dehydration   . Dementia   . Depression   . Diabetes mellitus without complication ([place])   . Elevated troponin   . Hypertension   . PNA (pneumonia)   . Pressure ulcer   . Seizures ([place])     Past Surgical History:  Procedure Laterality Date  . NO PAST SURGERIES      Patient Care Team: Hennie Duos, MD as PCP - General (Internal Medicine)  Social History   Social History  . Marital status: Single    Spouse name: N/A  . Number of children: 0  . Years of education: 4   Occupational History  . retired    Social History Main Topics  . Smoking status: Former Smoker    Quit date: 07/07/2010  . Smokeless tobacco: Never Used  . Alcohol use No  . Drug use: No  . Sexual activity: Not on file   Other Topics Concern  . Not on file   Social History Narrative   Lives with sister and brother   Caffeine use: Tea    Very little coffee   Soda sometimes     reports that he quit smoking about 5 years ago. He has never used smokeless tobacco. He reports that he does not drink alcohol or use drugs.  Family History  Problem Relation Age of Onset  .  Dementia Mother   . Colon cancer Father   . Diabetes Mellitus II Brother    Family Status  Relation Status  . Mother Deceased at age 74  . Father Deceased  . Sister Alive  . Brother     Immunization History  Administered Date(s) Administered  . Influenza,inj,Quad PF,36+ Mos 07/07/2015  . PPD Test 07/10/2015, 06/04/2016    No Known Allergies  Medications: Patient's Medications  New Prescriptions   No medications on file  Previous Medications   ASPIRIN 325 MG TABLET    Take 1 tablet (325 mg total) by mouth daily.   ATORVASTATIN (LIPITOR) 20 MG TABLET    Take 20 mg by mouth daily.   CEFPODOXIME (VANTIN) 200 MG TABLET    Take 1 tablet (200 mg total) by mouth  every 12 (twelve) hours.   CHOLECALCIFEROL (VITAMIN D PO)    Take 1 tablet by mouth daily.   CITALOPRAM (CELEXA) 10 MG TABLET    Take 10 mg by mouth daily after supper.    FOLIC ACID (FOLVITE) 1 MG TABLET    Take 1 tablet (1 mg total) by mouth daily.   MEMANTINE (NAMENDA) 10 MG TABLET    Take 10 mg by mouth 2 (two) times daily.    THIAMINE 100 MG TABLET    Take 1 tablet (100 mg total) by mouth daily.  Modified Medications   No medications on file  Discontinued Medications   No medications on file    Review of Systems  Unable to perform ROS: Dementia    Vitals:   06/06/16 1154  BP: 134/66  Pulse: 66  Resp: 18  Temp: 98 F (36.7 C)  TempSrc: Oral  SpO2: 98%  Weight: 108 lb (49 kg)  Height: _0  (1.727 m)   Body mass index is 16.42 kg/m.  Physical Exam  Constitutional: He appears well-developed.  Frail appearing sitting in w/c at bedside in NAD  HENT:  Mouth/Throat: Oropharynx is clear and moist.  MMM. No oral thrush  Eyes: Pupils are equal, round, and reactive to light. No scleral icterus.  Neck: Neck supple. Carotid bruit is not present. No thyromegaly present.  Cardiovascular: Normal rate, regular rhythm and intact distal pulses.  Exam reveals no gallop and no friction rub.   Murmur (1/6 SEM) heard. no distal LE swelling. No calf TTP  Pulmonary/Chest: Effort normal and breath sounds normal. No stridor. He has no wheezes. He has no rales. He exhibits no tenderness.  Abdominal: Soft. Bowel sounds are normal. He exhibits no distension, no abdominal bruit, no pulsatile midline mass and no mass. There is no hepatomegaly. There is no tenderness. There is no rebound and no guarding.  Musculoskeletal: He exhibits edema.  Lymphadenopathy:    He has no cervical adenopathy.  Neurological: He is alert.  Skin: Skin is warm and dry. No rash noted.  Psychiatric: He has a normal mood and affect. His behavior is normal.     Labs reviewed: Admission on 05/28/2016, Discharged on  06/03/2016  Component Date Value Ref Range Status  . Lactic Acid, Venous 05/28/2016 2.95* 0.5 - 1.9 mmol/L Final  . Comment 05/28/2016 NOTIFIED PHYSICIAN   Final  . WBC 05/28/2016 12.5* 4.0 - 10.5 K/uL Final  . RBC 05/28/2016 3.78* 4.22 - 5.81 MIL/uL Final  . Hemoglobin 05/28/2016 11.0* 13.0 - 17.0 g/dL Final  . HCT 05/28/2016 35.1* 39.0 - 52.0 % Final  . MCV 05/28/2016 92.9  78.0 - 100.0 fL Final  . MCH 05/28/2016 29.1  26.0 - 34.0 pg Final  . MCHC 05/28/2016 31.3  30.0 - 36.0 g/dL Final  . RDW 05/28/2016 14.2  11.5 - 15.5 % Final  . Platelets 05/28/2016 272  150 - 400 K/uL Final  . Neutrophils Relative % 05/28/2016 79  % Final  . Neutro Abs 05/28/2016 10.0* 1.7 - 7.7 K/uL Final  . Lymphocytes Relative 05/28/2016 15  % Final  . Lymphs Abs 05/28/2016 1.8  0.7 - 4.0 K/uL Final  . Monocytes Relative 05/28/2016 6  % Final  . Monocytes Absolute 05/28/2016 0.7  0.1 - 1.0 K/uL Final  . Eosinophils Relative 05/28/2016 0  % Final  . Eosinophils Absolute 05/28/2016 0.0  0.0 - 0.7 K/uL Final  . Basophils Relative 05/28/2016 0  % Final  . Basophils Absolute 05/28/2016 0.0  0.0 - 0.1 K/uL Final  . Specimen Description 05/28/2016 BLOOD LEFT FOREARM   Final  . Special Requests 05/28/2016 BOTTLES DRAWN AEROBIC AND ANAEROBIC 5CC   Final  . Culture 05/28/2016 NO GROWTH 5 DAYS   Final  . Report Status 05/28/2016 06/02/2016 FINAL   Final  . Specimen Description 05/28/2016 BLOOD RIGHT HAND   Final  . Special Requests 05/28/2016 IN PEDIATRIC BOTTLE 2CC   Final  . Culture 05/28/2016 NO GROWTH 5 DAYS   Final  . Report Status 05/28/2016 06/02/2016 FINAL   Final  . Color, Urine 05/28/2016 YELLOW  YELLOW Final  . APPearance 05/28/2016 CLOUDY* CLEAR Final  . Specific Gravity, Urine 05/28/2016 1.006  1.005 - 1.030 Final  . pH 05/28/2016 7.0  5.0 - 8.0 Final  . Glucose, UA 05/28/2016 >=500* NEGATIVE mg/dL Final  . Hgb urine dipstick 05/28/2016 SMALL* NEGATIVE Final  . Bilirubin Urine 05/28/2016 NEGATIVE   NEGATIVE Final  . Ketones, ur 05/28/2016 NEGATIVE  NEGATIVE mg/dL Final  . Protein, ur 05/28/2016 NEGATIVE  NEGATIVE mg/dL Final  . Nitrite 05/28/2016 NEGATIVE  NEGATIVE Final  . Leukocytes, UA 05/28/2016 LARGE* NEGATIVE Final  . RBC / HPF 05/28/2016 6-30  0 - 5 RBC/hpf Final  . WBC, UA 05/28/2016 TOO NUMEROUS TO COUNT  0 - 5 WBC/hpf Final  . Bacteria, UA 05/28/2016 RARE* NONE SEEN Final  . Squamous Epithelial / LPF 05/28/2016 0-5* NONE SEEN Final  . Mucous 05/28/2016 PRESENT   Final  . Budding Yeast 05/28/2016 PRESENT   Final  . Influenza A By PCR 05/28/2016 NEGATIVE  NEGATIVE Final  . Influenza B By PCR 05/28/2016 NEGATIVE  NEGATIVE Final   Comment: (NOTE) The Xpert Xpress Flu assay is intended as an aid in the diagnosis of  influenza and should not be used as a sole basis for treatment.  This  assay is FDA approved for nasopharyngeal swab specimens only. Nasal  washings and aspirates are unacceptable for Xpert Xpress Flu testing.   . Sodium 05/28/2016 146* 135 - 145 mmol/L Final  . Potassium 05/28/2016 3.3* 3.5 - 5.1 mmol/L Final  . Chloride 05/28/2016 103  101 - 111 mmol/L Final  . CO2 05/28/2016 30  22 - 32 mmol/L Final  . Glucose, Bld 05/28/2016 256* 65 - 99 mg/dL Final  . BUN 05/28/2016 17  6 - 20 mg/dL Final  . Creatinine, Ser 05/28/2016 0.80  0.61 - 1.24 mg/dL Final  . Calcium 05/28/2016 9.4  8.9 - 10.3 mg/dL Final  . Total Protein 05/28/2016 8.2* 6.5 - 8.1 g/dL Final  . Albumin 05/28/2016 3.1* 3.5 - 5.0 g/dL Final  . AST 05/28/2016 37  15 - 41 U/L Final  . ALT  05/28/2016 47  17 - 63 U/L Final  . Alkaline Phosphatase 05/28/2016 83  38 - 126 U/L Final  . Total Bilirubin 05/28/2016 0.4  0.3 - 1.2 mg/dL Final  . GFR calc non Af Amer 05/28/2016 >60  >60 mL/min Final  . GFR calc Af Amer 05/28/2016 >60  >60 mL/min Final   Comment: (NOTE) The eGFR has been calculated using the CKD EPI equation. This calculation has not been validated in all clinical situations. eGFR's  persistently <60 mL/min signify possible Chronic Kidney Disease.   . Anion gap 05/28/2016 13  5 - 15 Final  . Lactic Acid, Venous 05/28/2016 1.96* 0.5 - 1.9 mmol/L Final  . Specimen Description 05/28/2016 URINE, RANDOM   Final  . Special Requests 05/28/2016 ADDED AT 2620 ON [id number]   Final  . Culture 05/28/2016 MULTIPLE SPECIES PRESENT, SUGGEST RECOLLECTION*  Final  . Report Status 05/28/2016 05/30/2016 FINAL   Final  . Lactic Acid, Venous 05/28/2016 2.1* 0.5 - 1.9 mmol/L Final   Comment: CRITICAL RESULT CALLED TO, READ BACK BY AND VERIFIED WITH: OLOKA,O RN 05/29/2016 0025 JORDANS   . Procalcitonin 05/28/2016 0.11  ng/mL Final   Comment:        Interpretation: PCT (Procalcitonin) <= 0.5 ng/mL: Systemic infection (sepsis) is not likely. Local bacterial infection is possible. (NOTE)         ICU PCT Algorithm               Non ICU PCT Algorithm    ----------------------------     ------------------------------         PCT < 0.25 ng/mL                 PCT < 0.1 ng/mL     Stopping of antibiotics            Stopping of antibiotics       strongly encouraged.               strongly encouraged.    ----------------------------     ------------------------------       PCT level decrease by               PCT < 0.25 ng/mL       >= 80% from peak PCT       OR PCT 0.25 - 0.5 ng/mL          Stopping of antibiotics                                             encouraged.     Stopping of antibiotics           encouraged.    ----------------------------     ------------------------------       PCT level decrease by              PCT >= 0.25 ng/mL       < 80% from peak PCT        AND PCT >= 0.5 ng/mL            Continuin                          g antibiotics  encouraged.       Continuing antibiotics            encouraged.    ----------------------------     ------------------------------     PCT level increase compared          PCT > 0.5 ng/mL          with peak PCT AND          PCT >= 0.5 ng/mL             Escalation of antibiotics                                          strongly encouraged.      Escalation of antibiotics        strongly encouraged.   . WBC 05/29/2016 12.4* 4.0 - 10.5 K/uL Final  . RBC 05/29/2016 3.35* 4.22 - 5.81 MIL/uL Final  . Hemoglobin 05/29/2016 9.6* 13.0 - 17.0 g/dL Final  . HCT 05/29/2016 31.0* 39.0 - 52.0 % Final  . MCV 05/29/2016 92.5  78.0 - 100.0 fL Final  . MCH 05/29/2016 28.7  26.0 - 34.0 pg Final  . MCHC 05/29/2016 31.0  30.0 - 36.0 g/dL Final  . RDW 05/29/2016 14.5  11.5 - 15.5 % Final  . Platelets 05/29/2016 256  150 - 400 K/uL Final  . Neutrophils Relative % 05/29/2016 66  % Final  . Neutro Abs 05/29/2016 8.1* 1.7 - 7.7 K/uL Final  . Lymphocytes Relative 05/29/2016 24  % Final  . Lymphs Abs 05/29/2016 3.0  0.7 - 4.0 K/uL Final  . Monocytes Relative 05/29/2016 10  % Final  . Monocytes Absolute 05/29/2016 1.3* 0.1 - 1.0 K/uL Final  . Eosinophils Relative 05/29/2016 0  % Final  . Eosinophils Absolute 05/29/2016 0.0  0.0 - 0.7 K/uL Final  . Basophils Relative 05/29/2016 0  % Final  . Basophils Absolute 05/29/2016 0.0  0.0 - 0.1 K/uL Final  . Troponin I 05/28/2016 0.04* <0.03 ng/mL Final   Comment: CRITICAL RESULT CALLED TO, READ BACK BY AND VERIFIED WITH: OLOKA,O RN 05/29/2016 0025 JORDANS   . Troponin I 05/29/2016 0.05* <0.03 ng/mL Final  . Troponin I 05/29/2016 0.05* <0.03 ng/mL Final  . Sodium 05/29/2016 149* 135 - 145 mmol/L Final  . Potassium 05/29/2016 3.3* 3.5 - 5.1 mmol/L Final  . Chloride 05/29/2016 110  101 - 111 mmol/L Final  . CO2 05/29/2016 27  22 - 32 mmol/L Final  . Glucose, Bld 05/29/2016 196* 65 - 99 mg/dL Final  . BUN 05/29/2016 13  6 - 20 mg/dL Final  . Creatinine, Ser 05/29/2016 0.74  0.61 - 1.24 mg/dL Final  . Calcium 05/29/2016 8.5* 8.9 - 10.3 mg/dL Final  . GFR calc non Af Amer 05/29/2016 >60  >60 mL/min Final  . GFR calc Af Amer 05/29/2016 >60  >60 mL/min Final    Comment: (NOTE) The eGFR has been calculated using the CKD EPI equation. This calculation has not been validated in all clinical situations. eGFR's persistently <60 mL/min signify possible Chronic Kidney Disease.   . Anion gap 05/29/2016 12  5 - 15 Final  . MRSA by PCR 05/29/2016 NEGATIVE  NEGATIVE Final   Comment:        The GeneXpert MRSA Assay (FDA approved for NASAL specimens only), is one component of a comprehensive MRSA colonization surveillance program. It is not intended to  diagnose MRSA infection nor to guide or monitor treatment for MRSA infections.   . Lactic Acid, Venous 05/29/2016 2.0* 0.5 - 1.9 mmol/L Final   Comment: CRITICAL RESULT CALLED TO, READ BACK BY AND VERIFIED WITH: S.ADAMS RN @ [phone] 05/29/16 BY C.EDENS   . Magnesium 05/29/2016 1.5* 1.7 - 2.4 mg/dL Final  . WBC 05/30/2016 10.5  4.0 - 10.5 K/uL Final  . RBC 05/30/2016 3.32* 4.22 - 5.81 MIL/uL Final  . Hemoglobin 05/30/2016 9.6* 13.0 - 17.0 g/dL Final  . HCT 05/30/2016 31.3* 39.0 - 52.0 % Final  . MCV 05/30/2016 94.3  78.0 - 100.0 fL Final  . MCH 05/30/2016 28.9  26.0 - 34.0 pg Final  . MCHC 05/30/2016 30.7  30.0 - 36.0 g/dL Final  . RDW 05/30/2016 14.6  11.5 - 15.5 % Final  . Platelets 05/30/2016 207  150 - 400 K/uL Final  . Sodium 05/30/2016 155* 135 - 145 mmol/L Final  . Potassium 05/30/2016 3.6  3.5 - 5.1 mmol/L Final  . Chloride 05/30/2016 119* 101 - 111 mmol/L Final  . CO2 05/30/2016 25  22 - 32 mmol/L Final  . Glucose, Bld 05/30/2016 114* 65 - 99 mg/dL Final  . BUN 05/30/2016 17  6 - 20 mg/dL Final  . Creatinine, Ser 05/30/2016 0.95  0.61 - 1.24 mg/dL Final  . Calcium 05/30/2016 8.3* 8.9 - 10.3 mg/dL Final  . GFR calc non Af Amer 05/30/2016 >60  >60 mL/min Final  . GFR calc Af Amer 05/30/2016 >60  >60 mL/min Final   Comment: (NOTE) The eGFR has been calculated using the CKD EPI equation. This calculation has not been validated in all clinical situations. eGFR's persistently <60 mL/min signify  possible Chronic Kidney Disease.   . Anion gap 05/30/2016 11  5 - 15 Final  . Magnesium 05/30/2016 2.5* 1.7 - 2.4 mg/dL Final  . WBC 05/31/2016 9.6  4.0 - 10.5 K/uL Final  . RBC 05/31/2016 3.43* 4.22 - 5.81 MIL/uL Final  . Hemoglobin 05/31/2016 10.0* 13.0 - 17.0 g/dL Final  . HCT 05/31/2016 32.2* 39.0 - 52.0 % Final  . MCV 05/31/2016 93.9  78.0 - 100.0 fL Final  . MCH 05/31/2016 29.2  26.0 - 34.0 pg Final  . MCHC 05/31/2016 31.1  30.0 - 36.0 g/dL Final  . RDW 05/31/2016 14.4  11.5 - 15.5 % Final  . Platelets 05/31/2016 194  150 - 400 K/uL Final  . Sodium 05/31/2016 152* 135 - 145 mmol/L Final  . Potassium 05/31/2016 2.9* 3.5 - 5.1 mmol/L Final  . Chloride 05/31/2016 117* 101 - 111 mmol/L Final  . CO2 05/31/2016 25  22 - 32 mmol/L Final  . Glucose, Bld 05/31/2016 201* 65 - 99 mg/dL Final  . BUN 05/31/2016 15  6 - 20 mg/dL Final  . Creatinine, Ser 05/31/2016 0.82  0.61 - 1.24 mg/dL Final  . Calcium 05/31/2016 8.6* 8.9 - 10.3 mg/dL Final  . GFR calc non Af Amer 05/31/2016 >60  >60 mL/min Final  . GFR calc Af Amer 05/31/2016 >60  >60 mL/min Final   Comment: (NOTE) The eGFR has been calculated using the CKD EPI equation. This calculation has not been validated in all clinical situations. eGFR's persistently <60 mL/min signify possible Chronic Kidney Disease.   . Anion gap 05/31/2016 10  5 - 15 Final  . WBC 06/01/2016 8.9  4.0 - 10.5 K/uL Final  . RBC 06/01/2016 3.66* 4.22 - 5.81 MIL/uL Final  . Hemoglobin 06/01/2016 10.6* 13.0 - 17.0 g/dL  Final  . HCT 06/01/2016 35.0* 39.0 - 52.0 % Final  . MCV 06/01/2016 95.6  78.0 - 100.0 fL Final  . MCH 06/01/2016 29.0  26.0 - 34.0 pg Final  . MCHC 06/01/2016 30.3  30.0 - 36.0 g/dL Final  . RDW 06/01/2016 14.9  11.5 - 15.5 % Final  . Platelets 06/01/2016 217  150 - 400 K/uL Final  . Sodium 06/01/2016 153* 135 - 145 mmol/L Final  . Potassium 06/01/2016 4.2  3.5 - 5.1 mmol/L Final  . Chloride 06/01/2016 119* 101 - 111 mmol/L Final  . CO2  06/01/2016 25  22 - 32 mmol/L Final  . Glucose, Bld 06/01/2016 211* 65 - 99 mg/dL Final  . BUN 06/01/2016 13  6 - 20 mg/dL Final  . Creatinine, Ser 06/01/2016 0.81  0.61 - 1.24 mg/dL Final  . Calcium 06/01/2016 8.8* 8.9 - 10.3 mg/dL Final  . GFR calc non Af Amer 06/01/2016 >60  >60 mL/min Final  . GFR calc Af Amer 06/01/2016 >60  >60 mL/min Final   Comment: (NOTE) The eGFR has been calculated using the CKD EPI equation. This calculation has not been validated in all clinical situations. eGFR's persistently <60 mL/min signify possible Chronic Kidney Disease.   . Anion gap 06/01/2016 9  5 - 15 Final  . Magnesium 06/01/2016 2.0  1.7 - 2.4 mg/dL Final  Office Visit on 04/05/2016  Component Date Value Ref Range Status  . Vitamin B-12 04/05/2016 1462* 232 - 1,245 pg/mL Final  . Folate 04/05/2016 >20.0  >3.0 ng/mL Final   Comment: A serum folate concentration of less than 3.1 ng/mL is considered to represent clinical deficiency.   . RPR Ser Ql 04/05/2016 Non Reactive  Non Reactive Final    Ct Head Wo Contrast  Result Date: 05/29/2016 CLINICAL DATA:  Acute encephalopathy. History of seizures, hypertension, diabetes, dementia, and Alzheimer's disease. EXAM: CT HEAD WITHOUT CONTRAST TECHNIQUE: Contiguous axial images were obtained from the base of the skull through the vertex without intravenous contrast. COMPARISON:  MRI brain 07/02/2015.  CT head 07/01/2015. FINDINGS: Brain: Diffuse cerebral atrophy. Ventricular dilatation consistent with central atrophy. Patchy low-attenuation changes in the deep white matter consistent with small vessel ischemia. Prominent calcification of the falx. No mass effect or midline shift. No abnormal extra-axial fluid collections. Gray-white matter junctions are distinct. Basal cisterns are not effaced. No acute intracranial hemorrhage. Vascular: Vascular calcifications are present peer Skull: Normal. Negative for fracture or focal lesion. Sinuses/Orbits: No acute  finding. Other: No significant changes since previous study. IMPRESSION: No acute intracranial abnormalities. Mild chronic atrophy and small vessel ischemic changes. Electronically Signed   By: Lucienne Capers M.D.   On: 05/29/2016 00:37   Dg Chest Port 1 View  Result Date: 05/28/2016 CLINICAL DATA:  High fever and altered mental status. Patient was uncooperative. EXAM: PORTABLE CHEST 1 VIEW COMPARISON:  05/28/2016 at 1801 hours FINDINGS: Shallow inspiration. Heart size and pulmonary vascularity are normal. No focal airspace disease or consolidation in the lungs. No pneumothorax. Calcified and tortuous aorta. IMPRESSION: No evidence of active pulmonary disease. Electronically Signed   By: Lucienne Capers M.D.   On: 05/28/2016 23:23   Dg Chest Port 1 View  Result Date: 05/28/2016 CLINICAL DATA:  Acute mental status change.  Fever. EXAM: PORTABLE CHEST 1 VIEW COMPARISON:  July 04, 2015 FINDINGS: The study is markedly limited due to positioning. The patient was unable/unwilling to cooperate. Within this limitation, no pneumothorax. The right lateral lower lung was not imaged. Within  visualize limits, no pneumonia identified. The cardiomediastinal silhouette is unchanged given difference in positioning. IMPRESSION: Markedly limited study as above.  No acute abnormalities are seen. Electronically Signed   By: Dorise Bullion III M.D   On: 05/28/2016 18:15     Assessment/Plan   ICD-9-CM ICD-10-CM   1. Physical deconditioning 799.3 R53.81   2. FTT (failure to thrive) in adult 783.7 R62.7   3. Alzheimer's dementia with behavioral disturbance, unspecified timing of dementia onset 331.0 G30.8    294.11 F02.81   4. Sepsis, due to unspecified organism ([place]) 038.9 A41.9    995.91    5. Depression, unspecified depression type 311 F32.9   6. Essential hypertension 401.9 I10   7. Mixed hyperlipidemia 272.2 E78.2   8. Electrolyte disturbance 276.9 E87.8     Cont current meds as ordered. finisih  vantin  PT/OT/ST as ordered  Check BMP and follow Na  Check lipid panel if no recent values  GOAL: short term rehab and d/c home when medically appropriate. Communicated with pt and nursing.  Will follow  Leyani Gargus S. Perlie Gold  [facility] [address] [place], [place] [address] [phone] Cell (Monday-Friday 8 AM - 5 PM) [phone] After 5 PM and follow prompts

## 2016-06-08 ENCOUNTER — Inpatient Hospital Stay (HOSPITAL_COMMUNITY)
Admission: EM | Admit: 2016-06-08 | Discharge: 2016-06-11 | DRG: 683 | Disposition: A | Discharge status: Skilled Nursing Facility | Payer: Medicare Other | Attending: Family Medicine | Admitting: Family Medicine

## 2016-06-08 ENCOUNTER — Emergency Department (HOSPITAL_COMMUNITY): Payer: Medicare Other

## 2016-06-08 ENCOUNTER — Encounter (HOSPITAL_COMMUNITY): Payer: Self-pay

## 2016-06-08 DIAGNOSIS — E1149 Type 2 diabetes mellitus with other diabetic neurological complication: Secondary | ICD-10-CM | POA: Diagnosis not present

## 2016-06-08 DIAGNOSIS — Z800 Family history of malignant neoplasm of digestive organs: Secondary | ICD-10-CM | POA: Diagnosis not present

## 2016-06-08 DIAGNOSIS — E860 Dehydration: Secondary | ICD-10-CM | POA: Diagnosis present

## 2016-06-08 DIAGNOSIS — G309 Alzheimer's disease, unspecified: Secondary | ICD-10-CM | POA: Diagnosis present

## 2016-06-08 DIAGNOSIS — N179 Acute kidney failure, unspecified: Principal | ICD-10-CM | POA: Diagnosis present

## 2016-06-08 DIAGNOSIS — IMO0002 Reserved for concepts with insufficient information to code with codable children: Secondary | ICD-10-CM | POA: Diagnosis present

## 2016-06-08 DIAGNOSIS — R7989 Other specified abnormal findings of blood chemistry: Secondary | ICD-10-CM | POA: Diagnosis not present

## 2016-06-08 DIAGNOSIS — E870 Hyperosmolality and hypernatremia: Secondary | ICD-10-CM | POA: Diagnosis present

## 2016-06-08 DIAGNOSIS — Z79899 Other long term (current) drug therapy: Secondary | ICD-10-CM | POA: Diagnosis not present

## 2016-06-08 DIAGNOSIS — N390 Urinary tract infection, site not specified: Secondary | ICD-10-CM | POA: Diagnosis present

## 2016-06-08 DIAGNOSIS — F0280 Dementia in other diseases classified elsewhere without behavioral disturbance: Secondary | ICD-10-CM | POA: Diagnosis present

## 2016-06-08 DIAGNOSIS — F0390 Unspecified dementia without behavioral disturbance: Secondary | ICD-10-CM | POA: Diagnosis present

## 2016-06-08 DIAGNOSIS — R740 Nonspecific elevation of levels of transaminase and lactic acid dehydrogenase [LDH]: Secondary | ICD-10-CM | POA: Diagnosis present

## 2016-06-08 DIAGNOSIS — F329 Major depressive disorder, single episode, unspecified: Secondary | ICD-10-CM | POA: Diagnosis present

## 2016-06-08 DIAGNOSIS — N3001 Acute cystitis with hematuria: Secondary | ICD-10-CM

## 2016-06-08 DIAGNOSIS — Z833 Family history of diabetes mellitus: Secondary | ICD-10-CM

## 2016-06-08 DIAGNOSIS — R339 Retention of urine, unspecified: Secondary | ICD-10-CM

## 2016-06-08 DIAGNOSIS — Z8673 Personal history of transient ischemic attack (TIA), and cerebral infarction without residual deficits: Secondary | ICD-10-CM | POA: Diagnosis not present

## 2016-06-08 DIAGNOSIS — Z87891 Personal history of nicotine dependence: Secondary | ICD-10-CM | POA: Diagnosis not present

## 2016-06-08 DIAGNOSIS — Z66 Do not resuscitate: Secondary | ICD-10-CM | POA: Diagnosis present

## 2016-06-08 DIAGNOSIS — E1165 Type 2 diabetes mellitus with hyperglycemia: Secondary | ICD-10-CM | POA: Diagnosis present

## 2016-06-08 DIAGNOSIS — E11649 Type 2 diabetes mellitus with hypoglycemia without coma: Secondary | ICD-10-CM | POA: Diagnosis present

## 2016-06-08 DIAGNOSIS — I10 Essential (primary) hypertension: Secondary | ICD-10-CM | POA: Diagnosis present

## 2016-06-08 DIAGNOSIS — Z7982 Long term (current) use of aspirin: Secondary | ICD-10-CM

## 2016-06-08 DIAGNOSIS — R739 Hyperglycemia, unspecified: Secondary | ICD-10-CM

## 2016-06-08 LAB — I-STAT CG4 LACTIC ACID, ED
LACTIC ACID, VENOUS: 2.43 mmol/L — AB (ref 0.5–1.9)
Lactic Acid, Venous: 2.91 mmol/L (ref 0.5–1.9)

## 2016-06-08 LAB — CBC WITH DIFFERENTIAL/PLATELET
BASOS ABS: 0 10*3/uL (ref 0.0–0.1)
BASOS PCT: 0 %
EOS PCT: 1 %
Eosinophils Absolute: 0.1 10*3/uL (ref 0.0–0.7)
HCT: 41.3 % (ref 39.0–52.0)
Hemoglobin: 12.9 g/dL — ABNORMAL LOW (ref 13.0–17.0)
LYMPHS PCT: 18 %
Lymphs Abs: 2 10*3/uL (ref 0.7–4.0)
MCH: 29.5 pg (ref 26.0–34.0)
MCHC: 31.2 g/dL (ref 30.0–36.0)
MCV: 94.3 fL (ref 78.0–100.0)
Monocytes Absolute: 1.5 10*3/uL — ABNORMAL HIGH (ref 0.1–1.0)
Monocytes Relative: 13 %
NEUTROS ABS: 7.8 10*3/uL — AB (ref 1.7–7.7)
Neutrophils Relative %: 68 %
PLATELETS: 372 10*3/uL (ref 150–400)
RBC: 4.38 MIL/uL (ref 4.22–5.81)
RDW: 14 % (ref 11.5–15.5)
WBC: 11.4 10*3/uL — AB (ref 4.0–10.5)

## 2016-06-08 LAB — URINALYSIS, ROUTINE W REFLEX MICROSCOPIC
BILIRUBIN URINE: NEGATIVE
KETONES UR: NEGATIVE mg/dL
NITRITE: NEGATIVE
PH: 6 (ref 5.0–8.0)
Protein, ur: 100 mg/dL — AB
SPECIFIC GRAVITY, URINE: 1.013 (ref 1.005–1.030)
Squamous Epithelial / LPF: NONE SEEN

## 2016-06-08 LAB — BASIC METABOLIC PANEL
Anion gap: 6 (ref 5–15)
BUN: 45 mg/dL — AB (ref 4–21)
BUN: 41 mg/dL — ABNORMAL HIGH (ref 6–20)
CALCIUM: 8.8 mg/dL — AB (ref 8.9–10.3)
CO2: 25 mmol/L (ref 22–32)
CREATININE: 1.3 mg/dL (ref 0.6–1.3)
CREATININE: 1.14 mg/dL (ref 0.61–1.24)
Chloride: 129 mmol/L — ABNORMAL HIGH (ref 101–111)
GFR calc Af Amer: >60 mL/min (ref >60)
GFR calc non Af Amer: 59 mL/min — ABNORMAL LOW (ref >60)
GLUCOSE: 676 mg/dL
GLUCOSE: 242 mg/dL — AB (ref 65–99)
POTASSIUM: 5 mmol/L (ref 3.4–5.3)
Potassium: 3.6 mmol/L (ref 3.5–5.1)
Sodium: 153 mmol/L — AB (ref 137–147)
Sodium: 160 mmol/L — ABNORMAL HIGH (ref 135–145)

## 2016-06-08 LAB — COMPREHENSIVE METABOLIC PANEL
ALBUMIN: 3.3 g/dL — AB (ref 3.5–5.0)
ALT: 119 U/L — AB (ref 17–63)
AST: 37 U/L (ref 15–41)
Alkaline Phosphatase: 98 U/L (ref 38–126)
Anion gap: 10 (ref 5–15)
BUN: 54 mg/dL — AB (ref 6–20)
CHLORIDE: 116 mmol/L — AB (ref 101–111)
CO2: 27 mmol/L (ref 22–32)
CREATININE: 1.8 mg/dL — AB (ref 0.61–1.24)
Calcium: 9.9 mg/dL (ref 8.9–10.3)
GFR calc Af Amer: 40 mL/min — ABNORMAL LOW (ref >60)
GFR, EST NON AFRICAN AMERICAN: 34 mL/min — AB (ref >60)
GLUCOSE: 622 mg/dL — AB (ref 65–99)
POTASSIUM: 5.4 mmol/L — AB (ref 3.5–5.1)
Sodium: 153 mmol/L — ABNORMAL HIGH (ref 135–145)
Total Bilirubin: 0.4 mg/dL (ref 0.3–1.2)
Total Protein: 9.6 g/dL — ABNORMAL HIGH (ref 6.5–8.1)

## 2016-06-08 LAB — CBG MONITORING, ED
GLUCOSE-CAPILLARY: 267 mg/dL — AB (ref 65–99)
Glucose-Capillary: 520 mg/dL (ref 65–99)
Glucose-Capillary: 310 mg/dL — ABNORMAL HIGH (ref 65–99)

## 2016-06-08 LAB — GLUCOSE, CAPILLARY: Glucose-Capillary: 216 mg/dL — ABNORMAL HIGH (ref 65–99)

## 2016-06-08 LAB — I-STAT TROPONIN, ED: Troponin i, poc: 0 ng/mL (ref 0.00–0.08)

## 2016-06-08 LAB — LACTIC ACID, PLASMA: Lactic Acid, Venous: 2.4 mmol/L (ref 0.5–1.9)

## 2016-06-08 MED ORDER — INSULIN ASPART 100 UNIT/ML ~~LOC~~ SOLN
0.0000 [IU] | Freq: Every day | SUBCUTANEOUS | Status: DC
  Administered 2016-06-08: 2 [IU] via SUBCUTANEOUS

## 2016-06-08 MED ORDER — SODIUM CHLORIDE 0.9 % IV BOLUS (SEPSIS)
1000.0000 mL | Freq: Once | INTRAVENOUS | Status: AC
  Administered 2016-06-08: 1000 mL via INTRAVENOUS

## 2016-06-08 MED ORDER — DEXTROSE 5 % IV SOLN
INTRAVENOUS | Status: DC
  Administered 2016-06-08: via INTRAVENOUS

## 2016-06-08 MED ORDER — INSULIN GLARGINE 100 UNIT/ML ~~LOC~~ SOLN
5.0000 [IU] | Freq: Every day | SUBCUTANEOUS | Status: DC
  Administered 2016-06-08: 5 [IU] via SUBCUTANEOUS
  Filled 2016-06-08 (×2): qty 0.05

## 2016-06-08 MED ORDER — MEMANTINE HCL 10 MG PO TABS
10.0000 mg | ORAL_TABLET | Freq: Two times a day (BID) | ORAL | Status: DC
  Administered 2016-06-08 – 2016-06-11 (×6): 10 mg via ORAL
  Filled 2016-06-08 (×6): qty 1

## 2016-06-08 MED ORDER — INSULIN ASPART 100 UNIT/ML ~~LOC~~ SOLN
8.0000 [IU] | Freq: Once | SUBCUTANEOUS | Status: AC
  Administered 2016-06-08: 8 [IU] via SUBCUTANEOUS
  Filled 2016-06-08: qty 1

## 2016-06-08 MED ORDER — ASPIRIN 325 MG PO TABS
325.0000 mg | ORAL_TABLET | Freq: Every day | ORAL | Status: DC
  Administered 2016-06-09 – 2016-06-11 (×3): 325 mg via ORAL
  Filled 2016-06-08 (×3): qty 1

## 2016-06-08 MED ORDER — VITAMIN D 1000 UNITS PO TABS
1000.0000 [IU] | ORAL_TABLET | Freq: Every day | ORAL | Status: DC
  Administered 2016-06-09 – 2016-06-11 (×3): 1000 [IU] via ORAL
  Filled 2016-06-08 (×3): qty 1

## 2016-06-08 MED ORDER — ENOXAPARIN SODIUM 30 MG/0.3ML ~~LOC~~ SOLN
30.0000 mg | SUBCUTANEOUS | Status: DC
  Administered 2016-06-08 – 2016-06-10 (×3): 30 mg via SUBCUTANEOUS
  Filled 2016-06-08 (×3): qty 0.3

## 2016-06-08 MED ORDER — ADULT MULTIVITAMIN W/MINERALS CH
1.0000 | ORAL_TABLET | Freq: Every day | ORAL | Status: DC
  Administered 2016-06-09 – 2016-06-11 (×3): 1 via ORAL
  Filled 2016-06-08 (×3): qty 1

## 2016-06-08 MED ORDER — THIAMINE HCL 100 MG PO TABS
100.0000 mg | ORAL_TABLET | Freq: Every day | ORAL | Status: DC
  Administered 2016-06-09 – 2016-06-11 (×3): 100 mg via ORAL
  Filled 2016-06-08 (×5): qty 1

## 2016-06-08 MED ORDER — CITALOPRAM HYDROBROMIDE 20 MG PO TABS
10.0000 mg | ORAL_TABLET | Freq: Every day | ORAL | Status: DC
  Administered 2016-06-08 – 2016-06-10 (×3): 10 mg via ORAL
  Filled 2016-06-08 (×3): qty 1

## 2016-06-08 MED ORDER — DEXTROSE 5 % IV SOLN
1.0000 g | INTRAVENOUS | Status: DC
  Administered 2016-06-09 – 2016-06-10 (×2): 1 g via INTRAVENOUS
  Filled 2016-06-08 (×3): qty 10

## 2016-06-08 MED ORDER — INSULIN ASPART 100 UNIT/ML ~~LOC~~ SOLN
0.0000 [IU] | Freq: Three times a day (TID) | SUBCUTANEOUS | Status: DC
  Administered 2016-06-09 (×2): 5 [IU] via SUBCUTANEOUS

## 2016-06-08 MED ORDER — SODIUM CHLORIDE 0.9 % IV BOLUS (SEPSIS)
250.0000 mL | Freq: Once | INTRAVENOUS | Status: AC
  Administered 2016-06-08: 250 mL via INTRAVENOUS

## 2016-06-08 MED ORDER — BOOST PLUS PO LIQD
237.0000 mL | Freq: Two times a day (BID) | ORAL | Status: DC
  Administered 2016-06-10 – 2016-06-11 (×2): 237 mL via ORAL
  Filled 2016-06-08 (×6): qty 237

## 2016-06-08 MED ORDER — ACETAMINOPHEN 650 MG RE SUPP: 650.0000 mg | Freq: Four times a day (QID) | RECTAL | Status: DC | PRN

## 2016-06-08 MED ORDER — ACETAMINOPHEN 325 MG PO TABS
650.0000 mg | ORAL_TABLET | Freq: Four times a day (QID) | ORAL | Status: DC | PRN
  Administered 2016-06-10: 650 mg via ORAL
  Filled 2016-06-08: qty 2

## 2016-06-08 MED ORDER — ATORVASTATIN CALCIUM 20 MG PO TABS
20.0000 mg | ORAL_TABLET | Freq: Every day | ORAL | Status: DC
  Administered 2016-06-09 – 2016-06-11 (×3): 20 mg via ORAL
  Filled 2016-06-08 (×3): qty 1

## 2016-06-08 MED ORDER — NUTRITIONAL SUPPLEMENT PLUS PO LIQD: 1.0000 | Freq: Two times a day (BID) | ORAL | Status: DC

## 2016-06-08 MED ORDER — DEXTROSE 5 % IV SOLN
1.0000 g | Freq: Once | INTRAVENOUS | Status: AC
  Administered 2016-06-08: 1 g via INTRAVENOUS
  Filled 2016-06-08: qty 10

## 2016-06-08 MED ORDER — FOLIC ACID 1 MG PO TABS
1.0000 mg | ORAL_TABLET | Freq: Every day | ORAL | Status: DC
  Administered 2016-06-09 – 2016-06-11 (×3): 1 mg via ORAL
  Filled 2016-06-08 (×3): qty 1

## 2016-06-08 NOTE — ED Notes (Signed)
I have just spoken with [facility] and they will accept pt. "after shift change". I will report this to next shift.

## 2016-06-08 NOTE — ED Provider Notes (Signed)
[facility]-EMERGENCY DEPT Provider Note   CSN: [id number] Arrival date & time: 06/08/16  1319     History   Chief Complaint Chief Complaint  Patient presents with  . Hyperglycemia    HPI Eric Cameron is a 82 y.o. male.  HPI Eric Cameron is a 82 y.o. male with hx of alzheimer disease, dementia, DM, HTN, seizure, presents to ED with complaint of hyperglyemia. Pt coming from SNF with complaint of elevated CBG over the weekend. Pt with recent admission for urosepsis. Pt's mental status at baseline according to SNF. No family at bedside. Pt has no complaints.   Past Medical History:  Diagnosis Date  . Acute encephalopathy   . Alzheimer disease   . Dehydration   . Dementia   . Depression   . Diabetes mellitus without complication (HCC)   . Elevated troponin   . Hypertension   . PNA (pneumonia)   . Pressure ulcer   . Seizures [facility])     Patient Active Problem List   Diagnosis Date Noted  . Sepsis (HCC) 05/28/2016  . UTI (urinary tract infection) 05/28/2016  . Alzheimer's dementia without behavioral disturbance 04/10/2016  . Fungemia 07/18/2015  . Leukocytosis 07/18/2015  . Prolonged Q-T interval on ECG 07/18/2015  . Pressure ulcer of hip 07/06/2015  . PNA (pneumonia) 07/06/2015  . Elevated troponin 07/01/2015  . Dehydration 07/01/2015  . Seizures (HCC) 12/03/2013  . H/O: CVA (cerebrovascular accident) 12/01/2013  . Altered mental status 11/30/2013  . Acute encephalopathy 11/30/2013  . HTN (hypertension) 11/30/2013  . Diabetes mellitus type 2, uncontrolled (HCC) 11/30/2013  . Dementia 11/30/2013  . HLD (hyperlipidemia) 11/30/2013    Past Surgical History:  Procedure Laterality Date  . NO PAST SURGERIES         Home Medications    Prior to Admission medications   Medication Sig Start Date End Date Taking? Authorizing Provider  Amino Acids (HIGH PROTEIN PO) Take 1 Bottle by mouth 2 (two) times daily.   Yes Historical Provider, MD  aspirin 325 MG tablet Take 1  tablet (325 mg total) by mouth daily. 12/03/13  Yes Maryann Mikhail, DO  atorvastatin (LIPITOR) 20 MG tablet Take 20 mg by mouth daily.   Yes Historical Provider, MD  cholecalciferol (VITAMIN D) 1000 units tablet Take 1,000 Units by mouth daily.   Yes Historical Provider, MD  citalopram (CELEXA) 10 MG tablet Take 10 mg by mouth at bedtime.    Yes Historical Provider, MD  folic acid (FOLVITE) 1 MG tablet Take 1 tablet (1 mg total) by mouth daily. 07/10/15  Yes Meredeth Ide, MD  memantine (NAMENDA) 10 MG tablet Take 10 mg by mouth 2 (two) times daily.    Yes Historical Provider, MD  Multiple Vitamin (MULTIVITAMIN WITH MINERALS) TABS tablet Take 1 tablet by mouth daily.   Yes Historical Provider, MD  Nutritional Supplements (NUTRITIONAL SUPPLEMENT PLUS) LIQD Take 1 Bottle by mouth 2 (two) times daily.   Yes Historical Provider, MD  thiamine 100 MG tablet Take 1 tablet (100 mg total) by mouth daily. 07/10/15  Yes Meredeth Ide, MD    Family History Family History  Problem Relation Age of Onset  . Dementia Mother   . Colon cancer Father   . Diabetes Mellitus II Brother     Social History Social History  Substance Use Topics  . Smoking status: Former Smoker    Quit date: 07/07/2010  . Smokeless tobacco: Never Used  . Alcohol use No     Allergies  Patient has no known allergies.   Review of Systems Review of Systems  Unable to perform ROS: Dementia     Physical Exam Updated Vital Signs BP 104/67 (BP Location: Left Arm)   Pulse 112   Temp 99.4 F (37.4 C) (Rectal)   Resp 12   SpO2 97%   Physical Exam  Constitutional: He appears well-developed and well-nourished.  Appears lethargic. Sleeping.  HENT:  Head: Normocephalic and atraumatic.  Eyes: Conjunctivae are normal.  Neck: Normal range of motion. Neck supple.  No meningismus.  Cardiovascular: Normal rate, regular rhythm and normal heart sounds.   Pulmonary/Chest: Effort normal. No respiratory distress. He has no wheezes.  He has no rales.  Abdominal: Soft. Bowel sounds are normal. He exhibits no distension. There is no tenderness. There is no rebound.  Musculoskeletal: He exhibits no edema.  Neurological: He is alert.  Oriented to self. Able to tell me his birthday. Unable to tell year or current location. Moving all extremities.  Skin: Skin is warm and dry.  Nursing note and vitals reviewed.    ED Treatments / Results  Labs (all labs ordered are listed, but only abnormal results are displayed) Labs Reviewed  CBC WITH DIFFERENTIAL/PLATELET - Abnormal; Notable for the following:       Result Value   WBC 11.4 (*)    Hemoglobin 12.9 (*)    Neutro Abs 7.8 (*)    Monocytes Absolute 1.5 (*)    All other components within normal limits  COMPREHENSIVE METABOLIC PANEL - Abnormal; Notable for the following:    Sodium 153 (*)    Potassium 5.4 (*)    Chloride 116 (*)    Glucose, Bld 622 (*)    BUN 54 (*)    Creatinine, Ser 1.80 (*)    Total Protein 9.6 (*)    Albumin 3.3 (*)    ALT 119 (*)    GFR calc non Af Amer 34 (*)    GFR calc Af Amer 40 (*)    All other components within normal limits  URINALYSIS, ROUTINE W REFLEX MICROSCOPIC - Abnormal; Notable for the following:    APPearance TURBID (*)    Glucose, UA >=500 (*)    Hgb urine dipstick MODERATE (*)    Protein, ur 100 (*)    Leukocytes, UA LARGE (*)    Bacteria, UA RARE (*)    All other components within normal limits  I-STAT CG4 LACTIC ACID, ED - Abnormal; Notable for the following:    Lactic Acid, Venous 2.43 (*)    All other components within normal limits  CBG MONITORING, ED - Abnormal; Notable for the following:    Glucose-Capillary 520 (*)    All other components within normal limits  I-STAT CG4 LACTIC ACID, ED - Abnormal; Notable for the following:    Lactic Acid, Venous 2.91 (*)    All other components within normal limits  CBG MONITORING, ED - Abnormal; Notable for the following:    Glucose-Capillary 310 (*)    All other  components within normal limits  CBG MONITORING, ED - Abnormal; Notable for the following:    Glucose-Capillary 267 (*)    All other components within normal limits  URINE CULTURE  I-STAT TROPOININ, ED    EKG  EKG Interpretation None       Radiology Dg Chest 2 View  Result Date: 06/08/2016 CLINICAL DATA:  Hyperglycemia.  Dementia. EXAM: CHEST  2 VIEW COMPARISON:  Single-view of the chest 05/28/2016 and 07/04/2015. FINDINGS: Lungs  are clear. Heart size is normal. No pneumothorax or pleural effusion. No acute bony abnormality. IMPRESSION: No acute disease. Electronically Signed   By: Drusilla Kannerhomas  Dalessio M.D.   On: 06/08/2016 14:33    Procedures Procedures (including critical care time)  Medications Ordered in ED Medications  sodium chloride 0.9 % bolus 1,000 mL (not administered)  sodium chloride 0.9 % bolus 1,000 mL (not administered)     Initial Impression / Assessment and Plan / ED Course  I have reviewed the triage vital signs and the nursing notes.  Pertinent labs & imaging results that were available during my care of the patient were reviewed by me and considered in my medical decision making (see chart for details).    Pt with SNF with hyperglycemia. Will get labs, IV fluids started. Pt is tachycardic, afebrile, BP and resp rate normal, does not meet sirs criteria. Will monitor. Glu greater than 600 here.   Pt's UA appears infected. Rocephin ordered. Lactic acid elevated. VS remaining normal. Pt receiving IV fluids and antibiotics. NAD. 8 units of insulin given subcutaneous for hyperglycemia. Patient's creatinine is doubled from just a week ago. Urinalysis pending.  6:03 PM I was told by attack, that patient had a greater than 1 L of urine in his bladder when she did in and out catheter. Ordered Foley for urinary retention. I was told by her and that they are not allowed to put a Foley is here unless absolutely necessary. I spoke with the admitting hospitalist, they will  come see patient for admission.  I discussed the findings and plan with patient's sister through the telephone. No one at patient's bedside at this time.  Vitals:   06/08/16 1329 06/08/16 1413 06/08/16 1421 06/08/16 1556  BP: 104/67  117/96 136/77  Pulse: 112  96 93  Resp: 12  15 17   Temp: 98.3 F (36.8 C) 99.4 F (37.4 C)    TempSrc: Oral Rectal    SpO2: 97%  96% 98%       Final Clinical Impressions(s) / ED Diagnoses   Final diagnoses:  Urinary tract infection without hematuria, site unspecified  Urinary retention  AKI (acute kidney injury) (HCC)  Hyperglycemia    New Prescriptions New Prescriptions   No medications on file     Jaynie Crumbleatyana Liela Rylee, PA-C 06/08/16 2236    Pricilla LovelessScott Goldston, MD 06/10/16 [phone]

## 2016-06-08 NOTE — ED Notes (Signed)
Attempted to call report to floor. Receiving nurse requested this writer call back in 15 minutes.

## 2016-06-08 NOTE — Final Progress Note (Signed)
Critical lab result of Lactic acid reported to ON-call, Donnamarie PoagK Kirby, PA.

## 2016-06-08 NOTE — ED Triage Notes (Signed)
This minimally verbal Alzheimer's/s/p cva pt. [facility] [address] having been found this morning with a cbg in the 600's. He arrives in no distress.

## 2016-06-08 NOTE — H&P (Signed)
History and Physical    Eric Cameron ZOX:[id number] DOB: 07-06-1934 DOA: 06/08/2016  PCP: [name], MD  Patient coming from: [facility]  Chief Complaint: Hyperglycemia  HPI: Eric Cameron is a 82 y.o. male with medical history significant of dementia, diabetes mellitus, hypernatremia, CVA. He presents with hyperglycemia from his nursing facility. Patient is unable to provide history secondary to dementia. Per chart review and sign out report, patient's blood sugar this morning was 600 at the nursing facility and he was transported to the [facility] ED for evaluation and treatment. He has been baseline with regard to his mental status and is not taking much by mouth.  ED Course: Vitals: Afebrile, normal pulse, respirations and blood pressure. On room air. Labs: Blood glucose of 622 -> 267, Sodium of 153, potassium of 5.4, chloride of 116, BUN of 54, Creatinine of 1.80, ALT of 119, WBC of 11.4, lactic acid 2.43->2.91, troponin negative, urine significant for large leukocytes, Hgb/RBCs, budding yeast and WBC Imaging: CXR significant for no acute disease. Medications/Course: Ceftriaxone, 2L NS bolus, Novolog 8u. In/Out cath relieved about 1L of urine  Review of Systems: Review of Systems  Unable to perform ROS: Dementia    Past Medical History:  Diagnosis Date  . Acute encephalopathy   . Alzheimer disease   . Dehydration   . Dementia   . Depression   . Diabetes mellitus without complication (HCC)   . Elevated troponin   . Hypertension   . PNA (pneumonia)   . Pressure ulcer   . Seizures (HCC)     Past Surgical History:  Procedure Laterality Date  . NO PAST SURGERIES       reports that he quit smoking about 5 years ago. He has never used smokeless tobacco. He reports that he does not drink alcohol or use drugs.  No Known Allergies  Family History  Problem Relation Age of Onset  . Dementia Mother   . Colon cancer Father   . Diabetes Mellitus II Brother      Prior to Admission medications   Medication Sig Start Date End Date Taking? Authorizing Provider  Amino Acids (HIGH PROTEIN PO) Take 1 Bottle by mouth 2 (two) times daily.   Yes Historical Provider, MD  aspirin 325 MG tablet Take 1 tablet (325 mg total) by mouth daily. 12/03/13  Yes Maryann Mikhail, DO  atorvastatin (LIPITOR) 20 MG tablet Take 20 mg by mouth daily.   Yes Historical Provider, MD  cholecalciferol (VITAMIN D) 1000 units tablet Take 1,000 Units by mouth daily.   Yes Historical Provider, MD  citalopram (CELEXA) 10 MG tablet Take 10 mg by mouth at bedtime.    Yes Historical Provider, MD  folic acid (FOLVITE) 1 MG tablet Take 1 tablet (1 mg total) by mouth daily. 07/10/15  Yes Meredeth Ide, MD  memantine (NAMENDA) 10 MG tablet Take 10 mg by mouth 2 (two) times daily.    Yes Historical Provider, MD  Multiple Vitamin (MULTIVITAMIN WITH MINERALS) TABS tablet Take 1 tablet by mouth daily.   Yes Historical Provider, MD  Nutritional Supplements (NUTRITIONAL SUPPLEMENT PLUS) LIQD Take 1 Bottle by mouth 2 (two) times daily.   Yes Historical Provider, MD  thiamine 100 MG tablet Take 1 tablet (100 mg total) by mouth daily. 07/10/15  Yes Meredeth Ide, MD    Physical Exam: Vitals:   06/08/16 1329 06/08/16 1413 06/08/16 1421 06/08/16 1556  BP: 104/67  117/96 136/77  Pulse: 112  96 93  Resp: 12  15 17  Temp: 98.3 F (36.8 C) 99.4 F (37.4 C)    TempSrc: Oral Rectal    SpO2: 97%  96% 98%     Constitutional: NAD, calm, comfortable Eyes: lids and conjunctivae normal ENMT: Mucous membranes are dry, nares patent Neck: normal, supple, no masses, no thyromegaly Respiratory: clear to auscultation bilaterally. Normal respiratory effort. No accessory muscle use.  Cardiovascular: Regular rate and rhythm, no murmurs. No extremity edema. Could not feel pedal pulses.  Abdomen: no tenderness, no masses palpated. Bowel sounds positive.  Musculoskeletal: no clubbing / cyanosis. No joint deformity  upper and lower extremities. Decreased muscle mass Skin: hypopigmented skin on right hip, poor skin turgor, ulcers present on heels Neurologic: Sensation intact, DTR normal.   Psychiatric: Impaired judgment and insight. Alert and not oriented. Flat affect   Labs on Admission: I have personally reviewed following labs and imaging studies  CBC:  Recent Labs Lab 06/08/16 1335  WBC 11.4*  NEUTROABS 7.8*  HGB 12.9*  HCT 41.3  MCV 94.3  PLT 372   Basic Metabolic Panel:  Recent Labs Lab 06/08/16 1335  NA 153*  K 5.4*  CL 116*  CO2 27  GLUCOSE 622*  BUN 54*  CREATININE 1.80*  CALCIUM 9.9   GFR: Estimated Creatinine Clearance: 23.1 mL/min (by C-G formula based on SCr of 1.8 mg/dL (H)). Liver Function Tests:  Recent Labs Lab 06/08/16 1335  AST 37  ALT 119*  ALKPHOS 98  BILITOT 0.4  PROT 9.6*  ALBUMIN 3.3*   No results for input(s): LIPASE, AMYLASE in the last 168 hours. No results for input(s): AMMONIA in the last 168 hours. Coagulation Profile: No results for input(s): INR, PROTIME in the last 168 hours. Cardiac Enzymes: No results for input(s): CKTOTAL, CKMB, CKMBINDEX, TROPONINI in the last 168 hours. BNP (last 3 results) No results for input(s): PROBNP in the last 8760 hours. HbA1C: No results for input(s): HGBA1C in the last 72 hours. CBG:  Recent Labs Lab 06/08/16 1335 06/08/16 1722 06/08/16 1750  GLUCAP 520* 310* 267*   Lipid Profile: No results for input(s): CHOL, HDL, LDLCALC, TRIG, CHOLHDL, LDLDIRECT in the last 72 hours. Thyroid Function Tests: No results for input(s): TSH, T4TOTAL, FREET4, T3FREE, THYROIDAB in the last 72 hours. Anemia Panel: No results for input(s): VITAMINB12, FOLATE, FERRITIN, TIBC, IRON, RETICCTPCT in the last 72 hours. Urine analysis:    Component Value Date/Time   COLORURINE YELLOW 06/08/2016 1622   APPEARANCEUR TURBID (A) 06/08/2016 1622   LABSPEC 1.013 06/08/2016 1622   PHURINE 6.0 06/08/2016 1622   GLUCOSEU  >=500 (A) 06/08/2016 1622   HGBUR MODERATE (A) 06/08/2016 1622   BILIRUBINUR NEGATIVE 06/08/2016 1622   KETONESUR NEGATIVE 06/08/2016 1622   PROTEINUR 100 (A) 06/08/2016 1622   UROBILINOGEN 1.0 11/30/2013 1734   NITRITE NEGATIVE 06/08/2016 1622   LEUKOCYTESUR LARGE (A) 06/08/2016 1622   Sepsis Labs: !!!!!!!!!!!!!!!!!!!!!!!!!!!!!!!!!!!!!!!!!!!! @LABRCNTIP (procalcitonin:4,lacticidven:4) )No results found for this or any previous visit (from the past 240 hour(s)).   Radiological Exams on Admission: Dg Chest 2 View  Result Date: 06/08/2016 CLINICAL DATA:  Hyperglycemia.  Dementia. EXAM: CHEST  2 VIEW COMPARISON:  Single-view of the chest 05/28/2016 and 07/04/2015. FINDINGS: Lungs are clear. Heart size is normal. No pneumothorax or pleural effusion. No acute bony abnormality. IMPRESSION: No acute disease. Electronically Signed   By: Drusilla Kannerhomas  Dalessio M.D.   On: 06/08/2016 14:33    EKG: Independently reviewed. Tachycardia, sinus.  Assessment/Plan Principal Problem:   Acute kidney injury [facility](HCC) Active Problems:  Diabetes mellitus type 2, uncontrolled (HCC)   Dementia   H/O: CVA (cerebrovascular accident)   UTI (urinary tract infection)   Elevated lactic acid level    Acute kidney injury Possibly secondary to dehydration vs urinary retention vs combination. Not taking oral fluids well secondary to acute illness vs progressive dementia, and will require IV hydration. -IV fluids -BMP   Hypernatremia Likely secondary to dehydration. Chronic. Corrected sodium of 161 mg/dL secondary to hyperglycemia. Mental status baseline per sign out from EDP. -BMP q12 hours -D5 water at 58ml/hr  Urinary tract infection Infection vs colonization. Last culture was significant for multiple organisms. Patient afebrile and appears at baseline for mental status per chart review and EDP signout. -continue ceftriaxone until urine culture results  ?Urinary retention Patient evacuated 1L of urine with an  in/out catheter but has had urine output in his underwear. Unable to quantify. -Post-void residual if able to obtain  Elevated lactic acid Secondary to dehydration. Slightly more elevated -give a third 1L NS bolus followed by D5 fluids as above -recheck lactic acid  Diabetes mellitus, type 2 Significantly elevated blood sugar which likely is contributing significantly to patient's dehydration. Cannot find medication to manage diabetes on patient's medical reconciliation obtained from nursing facility [facility]. Was not discharged on medication from previous admission. Blood sugar improved with Novolog in ED -Lantus 5u qHS -SSI sensitive -hemoglobin A1C  Dementia Depression Demential appears advanced as patient is not have good oral intake. Possible this could be related to acute infection/significant dehydration, but seems unlikely with patient's overall weight and nutrition status. Would likely benefit from palliative care medicine consult, but was unable to discuss things with family in full detail. -continue Namenda -continue celexa  History of CVA -continue ASA -continue atorvastatin   DVT prophylaxis: Lovenox Code Status: DNR Family Communication: Called sister, however, call disconnected and I was able to get back in touch with her Disposition Plan: Discharge to SNF when medically stable. Consults called: None Admission status: Inpatient, medical floor   Jacquelin Hawking, MD [facility] Pager [phone]  If 7PM-7AM, please contact night-coverage [email or web address] Password [facility]  06/08/2016, 6:09 PM

## 2016-06-08 NOTE — ED Notes (Signed)
Bed: WA02 Expected date:  Expected time:  Means of arrival:  Comments: Ems 82 yo m hyperglycemia

## 2016-06-09 LAB — BASIC METABOLIC PANEL
ANION GAP: 7 (ref 5–15)
ANION GAP: 7 (ref 5–15)
BUN: 38 mg/dL — ABNORMAL HIGH (ref 6–20)
BUN: 35 mg/dL — ABNORMAL HIGH (ref 6–20)
CHLORIDE: 126 mmol/L — AB (ref 101–111)
CHLORIDE: 121 mmol/L — AB (ref 101–111)
CO2: 23 mmol/L (ref 22–32)
CO2: 23 mmol/L (ref 22–32)
CREATININE: 1.08 mg/dL (ref 0.61–1.24)
Calcium: 8.6 mg/dL — ABNORMAL LOW (ref 8.9–10.3)
Calcium: 8.7 mg/dL — ABNORMAL LOW (ref 8.9–10.3)
Creatinine, Ser: 1.26 mg/dL — ABNORMAL HIGH (ref 0.61–1.24)
GFR calc Af Amer: >60 mL/min (ref >60)
GFR calc non Af Amer: >60 mL/min (ref >60)
GFR, EST NON AFRICAN AMERICAN: 52 mL/min — AB (ref >60)
Glucose, Bld: 253 mg/dL — ABNORMAL HIGH (ref 65–99)
Glucose, Bld: 378 mg/dL — ABNORMAL HIGH (ref 65–99)
POTASSIUM: 3.8 mmol/L (ref 3.5–5.1)
Potassium: 4.4 mmol/L (ref 3.5–5.1)
SODIUM: 156 mmol/L — AB (ref 135–145)
SODIUM: 151 mmol/L — AB (ref 135–145)

## 2016-06-09 LAB — CBC
HCT: 34 % — ABNORMAL LOW (ref 39.0–52.0)
HEMOGLOBIN: 10.4 g/dL — AB (ref 13.0–17.0)
MCH: 29.1 pg (ref 26.0–34.0)
MCHC: 30.6 g/dL (ref 30.0–36.0)
MCV: 95 fL (ref 78.0–100.0)
Platelets: 329 10*3/uL (ref 150–400)
RBC: 3.58 MIL/uL — AB (ref 4.22–5.81)
RDW: 13.9 % (ref 11.5–15.5)
WBC: 10.9 10*3/uL — AB (ref 4.0–10.5)

## 2016-06-09 LAB — GLUCOSE, CAPILLARY
GLUCOSE-CAPILLARY: 299 mg/dL — AB (ref 65–99)
GLUCOSE-CAPILLARY: 330 mg/dL — AB (ref 65–99)
GLUCOSE-CAPILLARY: 73 mg/dL (ref 65–99)
Glucose-Capillary: 257 mg/dL — ABNORMAL HIGH (ref 65–99)
Glucose-Capillary: 58 mg/dL — ABNORMAL LOW (ref 65–99)

## 2016-06-09 LAB — SODIUM, URINE, RANDOM: SODIUM UR: 91 mmol/L

## 2016-06-09 LAB — CREATININE, URINE, RANDOM: Creatinine, Urine: 27.97 mg/dL

## 2016-06-09 MED ORDER — SODIUM CHLORIDE 0.45 % IV SOLN
INTRAVENOUS | Status: DC
  Administered 2016-06-09 – 2016-06-10 (×3): via INTRAVENOUS

## 2016-06-09 MED ORDER — INSULIN GLARGINE 100 UNIT/ML ~~LOC~~ SOLN
10.0000 [IU] | Freq: Every day | SUBCUTANEOUS | Status: DC
  Filled 2016-06-09: qty 0.1

## 2016-06-09 MED ORDER — INSULIN GLARGINE 100 UNIT/ML ~~LOC~~ SOLN
5.0000 [IU] | Freq: Every day | SUBCUTANEOUS | Status: DC
Start: 2016-06-09 — End: 2016-06-11
  Administered 2016-06-10: 5 [IU] via SUBCUTANEOUS
  Filled 2016-06-09 (×3): qty 0.05

## 2016-06-09 MED ORDER — INSULIN ASPART 100 UNIT/ML ~~LOC~~ SOLN: 0.0000 [IU] | Freq: Every day | SUBCUTANEOUS | Status: DC

## 2016-06-09 MED ORDER — INSULIN ASPART 100 UNIT/ML ~~LOC~~ SOLN
0.0000 [IU] | Freq: Three times a day (TID) | SUBCUTANEOUS | Status: DC
Start: 2016-06-09 — End: 2016-06-10
  Administered 2016-06-09: 11 [IU] via SUBCUTANEOUS
  Administered 2016-06-10: 2 [IU] via SUBCUTANEOUS

## 2016-06-09 NOTE — Progress Notes (Signed)
PROGRESS NOTE    Eric Cameron  VHQ:[id number] DOB: 1934/10/18 DOA: 06/08/2016 PCP: [name], MD   Brief Narrative: Eric Cameron is a 82 y.o. male with medical history significant of dementia, diabetes mellitus, hypernatremia, CVA. He presented with hyperglycemia secondary to an untreated diabetes mellitus in addition to hypernatremia.   Assessment & Plan:   Principal Problem:   Acute kidney injury (HCC) Active Problems:   Diabetes mellitus type 2, uncontrolled (HCC)   Dementia   H/O: CVA (cerebrovascular accident)   UTI (urinary tract infection)   Elevated lactic acid level   Acute kidney injury Improving but not at baseline -IV fluids -BMP   Hypernatremia Improving -BMP q12 hours -switch to 1/2 NS fluids as blood sugars becoming more and more difficult to manage.  Urinary tract infection Urine culture pending. -continue ceftriaxone   ?Urinary retention Patient evacuated 1L of urine with an in/out catheter but has had urine output in his underwear. Unable to quantify. -Post-void residual if able to obtain  Elevated lactic acid Secondary to dehydration. Improved with third bolus.  Diabetes mellitus, type 2 Still elevated. Made worse with D5 fluids.  -Lantus 5u qHS -SSI moderate -hemoglobin A1C pending -switch to 1/2NS fluids  Dementia Depression Demential appears advanced as patient is not have good oral intake. Possible this could be related to acute infection/significant dehydration, but seems unlikely with patient's overall weight and nutrition status. Improved somewhat today. -continue celexa -outpatient palliative care  History of CVA -continue ASA -continue atorvastatin   DVT prophylaxis: Lovenox Code Status: DNR Family Communication: None at bedside. Discussed with sister over the phone.  Disposition Plan: Discharge home tomorrow pending improvement of hypernatremia   Consultants:    None  Procedures:  None  Antimicrobials:  None    Subjective: Patient reports no pain today.   Objective: Vitals:   06/08/16 1901 06/08/16 2052 06/09/16 0419 06/09/16 1430  BP: 133/82 121/70 130/66 109/60  Pulse: 92 87 89 86  Resp: 23 20 18 20   Temp:  97.7 F (36.5 C) 98 F (36.7 C) 97.9 F (36.6 C)  TempSrc:  Oral Oral Oral  SpO2: 100% 100% 99% 100%  Weight:   47.6 kg (104 lb 15 oz)     Intake/Output Summary (Last 24 hours) at 06/09/16 1443 Last data filed at 06/09/16 1407  Gross per 24 hour  Intake          1198.75 ml  Output             2000 ml  Net          -801.25 ml   Filed Weights   06/09/16 0419  Weight: 47.6 kg (104 lb 15 oz)    Examination:  General exam: Appears calm and comfortable  Respiratory system: Clear to auscultation. Respiratory effort normal. Cardiovascular system: S1 & S2 heard, RRR. No murmurs, rubs, gallops or clicks. Gastrointestinal system: Abdomen is nondistended, soft and nontender. Normal bowel sounds heard. Central nervous system: Alert and oriented to person only Extremities: No edema. No calf tenderness Skin: No cyanosis. No rashes Psychiatry: Judgement and insight appear impaired. Mood & affect depressed and flat    Data Reviewed: I have personally reviewed following labs and imaging studies  CBC:  Recent Labs Lab 06/08/16 1335 06/09/16 0440  WBC 11.4* 10.9*  NEUTROABS 7.8*  --   HGB 12.9* 10.4*  HCT 41.3 34.0*  MCV 94.3 95.0  PLT 372 329   Basic Metabolic Panel:  Recent Labs Lab 06/08/16 1335 06/08/16 2227  06/09/16 0440  NA 153* 160* 156*  K 5.4* 3.6 4.4  CL 116* 129* 126*  CO2 27 25 23   GLUCOSE 622* 242* 253*  BUN 54* 41* 38*  CREATININE 1.80* 1.14 1.08  CALCIUM 9.9 8.8* 8.6*   GFR: Estimated Creatinine Clearance: 37.3 mL/min (by C-G formula based on SCr of 1.08 mg/dL). Liver Function Tests:  Recent Labs Lab 06/08/16 1335  AST 37  ALT 119*  ALKPHOS 98  BILITOT 0.4  PROT 9.6*   ALBUMIN 3.3*   No results for input(s): LIPASE, AMYLASE in the last 168 hours. No results for input(s): AMMONIA in the last 168 hours. Coagulation Profile: No results for input(s): INR, PROTIME in the last 168 hours. Cardiac Enzymes: No results for input(s): CKTOTAL, CKMB, CKMBINDEX, TROPONINI in the last 168 hours. BNP (last 3 results) No results for input(s): PROBNP in the last 8760 hours. HbA1C: No results for input(s): HGBA1C in the last 72 hours. CBG:  Recent Labs Lab 06/08/16 1722 06/08/16 1750 06/08/16 2044 06/09/16 1030 06/09/16 1343  GLUCAP 310* 267* 216* 257* 299*   Lipid Profile: No results for input(s): CHOL, HDL, LDLCALC, TRIG, CHOLHDL, LDLDIRECT in the last 72 hours. Thyroid Function Tests: No results for input(s): TSH, T4TOTAL, FREET4, T3FREE, THYROIDAB in the last 72 hours. Anemia Panel: No results for input(s): VITAMINB12, FOLATE, FERRITIN, TIBC, IRON, RETICCTPCT in the last 72 hours. Sepsis Labs:  Recent Labs Lab 06/08/16 1354 06/08/16 1659 06/08/16 2227  LATICACIDVEN 2.43* 2.91* 2.4*    No results found for this or any previous visit (from the past 240 hour(s)).       Radiology Studies: Dg Chest 2 View  Result Date: 06/08/2016 CLINICAL DATA:  Hyperglycemia.  Dementia. EXAM: CHEST  2 VIEW COMPARISON:  Single-view of the chest 05/28/2016 and 07/04/2015. FINDINGS: Lungs are clear. Heart size is normal. No pneumothorax or pleural effusion. No acute bony abnormality. IMPRESSION: No acute disease. Electronically Signed   By: Drusilla Kannerhomas  Dalessio M.D.   On: 06/08/2016 14:33        Scheduled Meds: . aspirin  325 mg Oral Daily  . atorvastatin  20 mg Oral Daily  . cefTRIAXone (ROCEPHIN)  IV  1 g Intravenous Q24H  . cholecalciferol  1,000 Units Oral Daily  . citalopram  10 mg Oral QHS  . enoxaparin (LOVENOX) injection  30 mg Subcutaneous Q24H  . folic acid  1 mg Oral Daily  . insulin aspart  0-5 Units Subcutaneous QHS  . insulin aspart  0-9 Units  Subcutaneous TID WC  . insulin glargine  5 Units Subcutaneous QHS  . lactose free nutrition  237 mL Oral BID BM  . memantine  10 mg Oral BID  . multivitamin with minerals  1 tablet Oral Daily  . thiamine  100 mg Oral Daily   Continuous Infusions: . dextrose 75 mL/hr at 06/08/16 2337     LOS: 1 day     Jacquelin HawkingRalph Ryane Konieczny [facility] 06/09/2016, 2:43 PM Pager: [phone]  If 7PM-7AM, please contact night-coverage [email or web address] Password [email or web address] 06/09/2016, 2:43 PM

## 2016-06-09 NOTE — NC FL2 (Signed)
[place] MEDICAID FL2 LEVEL OF CARE SCREENING TOOL     IDENTIFICATION  Patient Name: Eric Cameron Birthdate: 1935-03-11 Sex: male Admission Date (Current Location): 06/08/2016  [facility] and [place] Number:  Producer, television/film/video and Address:  [facility],  [address] [place], [place] [address]      Provider Number: [id number]  Attending Physician Name and Address:  Narda Bonds, MD  Relative Name and Phone Number:       Current Level of Care: Hospital Recommended Level of Care: Skilled Nursing Facility Prior Approval Number:    Date Approved/Denied:   PASRR Number:  [id number])  Discharge Plan: SNF    Current Diagnoses: Patient Active Problem List   Diagnosis Date Noted  . Acute kidney injury (HCC) 06/08/2016  . Elevated lactic acid level 06/08/2016  . Sepsis (HCC) 05/28/2016  . UTI (urinary tract infection) 05/28/2016  . Alzheimer's dementia without behavioral disturbance 04/10/2016  . Fungemia 07/18/2015  . Leukocytosis 07/18/2015  . Pressure ulcer of hip 07/06/2015  . PNA (pneumonia) 07/06/2015  . Elevated troponin 07/01/2015  . Dehydration 07/01/2015  . Seizures (HCC) 12/03/2013  . H/O: CVA (cerebrovascular accident) 12/01/2013  . Altered mental status 11/30/2013  . Acute encephalopathy 11/30/2013  . HTN (hypertension) 11/30/2013  . Diabetes mellitus type 2, uncontrolled (HCC) 11/30/2013  . Dementia 11/30/2013  . HLD (hyperlipidemia) 11/30/2013    Orientation RESPIRATION BLADDER Height & Weight     Self  O2 (at 2L) Incontinent, External catheter Weight: 104 lb 15 oz (47.6 kg) Height:     BEHAVIORAL SYMPTOMS/MOOD NEUROLOGICAL BOWEL NUTRITION STATUS   (none ) Convulsions/Seizures Continent Diet (Heart Healthy/Carb Modified )  AMBULATORY STATUS COMMUNICATION OF NEEDS Skin   Extensive Assist Verbally Normal                       Personal Care Assistance Level of Assistance  Bathing, Feeding, Dressing Bathing Assistance: Maximum  assistance Feeding assistance: Limited assistance Dressing Assistance: Maximum assistance     Functional Limitations Info  Speech, Sight Sight Info: Adequate Hearing Info: Adequate Speech Info: Adequate    SPECIAL CARE FACTORS FREQUENCY                       Contractures      Additional Factors Info  Code Status, Allergies Code Status Info: DNR Allergies Info: NKA           Current Medications (06/09/2016):  This is the current hospital active medication list Current Facility-Administered Medications  Medication Dose Route Frequency Provider Last Rate Last Dose  . acetaminophen (TYLENOL) tablet 650 mg  650 mg Oral Q6H PRN Narda Bonds, MD       Or  . acetaminophen (TYLENOL) suppository 650 mg  650 mg Rectal Q6H PRN Narda Bonds, MD      . aspirin tablet 325 mg  325 mg Oral Daily Narda Bonds, MD   325 mg at 06/09/16 1029  . atorvastatin (LIPITOR) tablet 20 mg  20 mg Oral Daily Narda Bonds, MD   20 mg at 06/09/16 1029  . cefTRIAXone (ROCEPHIN) 1 g in dextrose 5 % 50 mL IVPB  1 g Intravenous Q24H Narda Bonds, MD      . cholecalciferol (VITAMIN D) tablet 1,000 Units  1,000 Units Oral Daily Narda Bonds, MD   1,000 Units at 06/09/16 1029  . citalopram (CELEXA) tablet 10 mg  10 mg Oral QHS Howell Pringle  Caleb PoppNettey, MD   10 mg at 06/08/16 2121  . dextrose 5 % solution   Intravenous Continuous Narda Bondsalph A Nettey, MD 75 mL/hr at 06/08/16 2337    . enoxaparin (LOVENOX) injection 30 mg  30 mg Subcutaneous Q24H Narda Bondsalph A Nettey, MD   30 mg at 06/08/16 2118  . folic acid (FOLVITE) tablet 1 mg  1 mg Oral Daily Narda Bondsalph A Nettey, MD   1 mg at 06/09/16 1030  . insulin aspart (novoLOG) injection 0-5 Units  0-5 Units Subcutaneous QHS Narda Bondsalph A Nettey, MD   2 Units at 06/08/16 2116  . insulin aspart (novoLOG) injection 0-9 Units  0-9 Units Subcutaneous TID WC Narda Bondsalph A Nettey, MD   5 Units at 06/09/16 1044  . insulin glargine (LANTUS) injection 5 Units  5 Units Subcutaneous QHS Narda Bondsalph A Nettey,  MD   5 Units at 06/08/16 2122  . lactose free nutrition (BOOST PLUS) liquid 237 mL  237 mL Oral BID BM Narda Bondsalph A Nettey, MD      . memantine [facility](NAMENDA) tablet 10 mg  10 mg Oral BID Narda Bondsalph A Nettey, MD   10 mg at 06/09/16 1029  . multivitamin with minerals tablet 1 tablet  1 tablet Oral Daily Narda Bondsalph A Nettey, MD   1 tablet at 06/09/16 1030  . thiamine tablet 100 mg  100 mg Oral Daily Narda Bondsalph A Nettey, MD         Discharge Medications: Please see discharge summary for a list of discharge medications.  Relevant Imaging Results:  Relevant Lab Results:   Additional Information SSN [id number]  Derenda Fennelixon, Nikole Swartzentruber A

## 2016-06-09 NOTE — Evaluation (Signed)
Clinical/Bedside Swallow Evaluation Patient Details  Name: Eric Cameron MRN: [id number] Date of Birth: 1934/08/16  Today's Date: 06/09/2016 Time: SLP Start Time (ACUTE ONLY): 1350 SLP Stop Time (ACUTE ONLY): 1410 SLP Time Calculation (min) (ACUTE ONLY): 20 min  Past Medical History:  Past Medical History:  Diagnosis Date  . Acute encephalopathy   . Alzheimer disease   . Dehydration   . Dementia   . Depression   . Diabetes mellitus without complication (HCC)   . Elevated troponin   . Hypertension   . PNA (pneumonia)   . Pressure ulcer   . Seizures (HCC)    Past Surgical History:  Past Surgical History:  Procedure Laterality Date  . NO PAST SURGERIES     HPI:  82 year old male admitted 06/08/16 due to hyperglycemia. PMH significant for dementia, seizures, HTN, DM, PNA, CVA, UTI.  CXR negative - lungs clear.  Assessment / Plan / Recommendation Clinical Impression  Nursing reports poor intake on current diet, but no overt s/s aspiration. Pt was eating lunch with assistance from his sister upon arrival of SLP. Pt's sister had cut regular consistency solids into small pieces, and pt was still having difficulty with timely oral prep and propulsion. This significantly increases risk of aspiration with fatigue, and/or poor po intake due to work required to chew and swallow regular solids. Pt tolerated puree consistency much more easily. Pt appears to tolerate thin liquids without difficulty. No overt s/s aspiration of any consistency tested. At this time, will modify solids to Dys 1, primarily for energy conservation. Safe swallow precautions [facility] and reviewed with pt, sister, and RN. ST will follow for assessment of diet tolerance and education, as well as readiness to advance to Dys 2 solids.   SLP Visit Diagnosis: Dysphagia, oropharyngeal phase (R13.12)    Aspiration Risk  Mild aspiration risk    Diet Recommendation Dysphagia 1 (Puree);Thin liquid   Liquid Administration  via: Cup;Straw Medication Administration: Whole meds with puree Supervision: Full supervision/cueing for compensatory strategies Compensations: Slow rate;Small sips/bites;Minimize environmental distractions Postural Changes: Seated upright at 90 degrees    Other  Recommendations Oral Care Recommendations: Oral care before and after PO   Follow up Recommendations 24 hour supervision/assistance;Skilled Nursing facility      Frequency and Duration min 1 x/week  1-2 weeks       Prognosis Prognosis for Safe Diet Advancement: Fair Barriers to Reach Goals: Cognitive deficits      Swallow Study   General Date of Onset: 06/08/16 HPI: 82 year old male admitted 06/08/16 due to hyperglycemia. PMH significant for dementia, seizures, HTN, DM, PNA, CVA, UTI.  Type of Study: Bedside Swallow Evaluation Previous Swallow Assessment: BSE 05/30/16 - Dys 1/NTL Diet Prior to this Study: Regular;Thin liquids Temperature Spikes Noted: No Respiratory Status: Room air History of Recent Intubation: No Behavior/Cognition: Alert;Cooperative;Distractible;Requires cueing Oral Cavity Assessment: Within Functional Limits Oral Care Completed by SLP: No Oral Cavity - Dentition: Poor condition;Missing dentition Vision: Functional for self-feeding Self-Feeding Abilities: Needs assist;Needs set up Patient Positioning: Upright in bed Baseline Vocal Quality: Low vocal intensity Volitional Cough: Cognitively unable to elicit Volitional Swallow: Unable to elicit    Oral/Motor/Sensory Function Overall Oral Motor/Sensory Function: Within functional limits   Ice Chips Ice chips: Not tested   Thin Liquid Thin Liquid: Within functional limits Presentation: Straw;Self Fed    Nectar Thick Nectar Thick Liquid: Not tested   Honey Thick Honey Thick Liquid: Not tested   Puree Puree: Within functional limits Presentation:  Spoon;Self Fed   Solid     Solid: Impaired Oral Phase Impairments: Poor awareness of  bolus;Impaired mastication Oral Phase Functional Implications: Prolonged oral transit;Impaired mastication;Oral residue;Oral holding       Eric Cameron B. Eric Cameron [facility], CCC-SLP [phone]  Eric Cameron 06/09/2016,2:45 PM

## 2016-06-10 LAB — GLUCOSE, CAPILLARY
GLUCOSE-CAPILLARY: 363 mg/dL — AB (ref 65–99)
GLUCOSE-CAPILLARY: 107 mg/dL — AB (ref 65–99)
Glucose-Capillary: 86 mg/dL (ref 65–99)
Glucose-Capillary: 121 mg/dL — ABNORMAL HIGH (ref 65–99)
Glucose-Capillary: 421 mg/dL — ABNORMAL HIGH (ref 65–99)
Glucose-Capillary: 162 mg/dL — ABNORMAL HIGH (ref 65–99)

## 2016-06-10 LAB — BASIC METABOLIC PANEL
Anion gap: 9 (ref 5–15)
Anion gap: 6 (ref 5–15)
BUN: 31 mg/dL — AB (ref 6–20)
BUN: 30 mg/dL — ABNORMAL HIGH (ref 6–20)
CALCIUM: 9 mg/dL (ref 8.9–10.3)
CALCIUM: 8.8 mg/dL — AB (ref 8.9–10.3)
CO2: 24 mmol/L (ref 22–32)
CO2: 22 mmol/L (ref 22–32)
CREATININE: 1.06 mg/dL (ref 0.61–1.24)
Chloride: 121 mmol/L — ABNORMAL HIGH (ref 101–111)
Chloride: 118 mmol/L — ABNORMAL HIGH (ref 101–111)
Creatinine, Ser: 0.9 mg/dL (ref 0.61–1.24)
GFR calc Af Amer: >60 mL/min (ref >60)
GFR calc Af Amer: >60 mL/min (ref >60)
GFR calc non Af Amer: >60 mL/min (ref >60)
GFR calc non Af Amer: >60 mL/min (ref >60)
GLUCOSE: 101 mg/dL — AB (ref 65–99)
GLUCOSE: 133 mg/dL — AB (ref 65–99)
Potassium: 4 mmol/L (ref 3.5–5.1)
Potassium: 3.8 mmol/L (ref 3.5–5.1)
Sodium: 154 mmol/L — ABNORMAL HIGH (ref 135–145)
Sodium: 146 mmol/L — ABNORMAL HIGH (ref 135–145)

## 2016-06-10 LAB — HEMOGLOBIN A1C
Hgb A1c MFr Bld: 8.7 % — ABNORMAL HIGH (ref 4.8–5.6)
MEAN PLASMA GLUCOSE: 203

## 2016-06-10 LAB — URINE CULTURE: Culture: <10000 — AB

## 2016-06-10 LAB — GLUCOSE, RANDOM: Glucose, Bld: 449 mg/dL — ABNORMAL HIGH (ref 65–99)

## 2016-06-10 MED ORDER — INSULIN ASPART 100 UNIT/ML ~~LOC~~ SOLN
0.0000 [IU] | Freq: Three times a day (TID) | SUBCUTANEOUS | Status: DC
  Administered 2016-06-10: 9 [IU] via SUBCUTANEOUS
  Administered 2016-06-11: 1 [IU] via SUBCUTANEOUS
  Administered 2016-06-11: 5 [IU] via SUBCUTANEOUS

## 2016-06-10 NOTE — Progress Notes (Signed)
MD notified of new CBG of 363. No orders given.

## 2016-06-10 NOTE — Progress Notes (Signed)
Called MD to see if still needed stat lab draw for glucose and he said no, just recheck CBG in an hour after administering insulin.

## 2016-06-10 NOTE — Progress Notes (Signed)
PROGRESS NOTE    Eric Cameron  ZOX:[id number] DOB: January 17, 1935 DOA: 06/08/2016 PCP: [name], MD   Brief Narrative: Eric Cameron is a 82 y.o. male with medical history significant of dementia, diabetes mellitus, hypernatremia, CVA. He presented with hyperglycemia secondary to an untreated diabetes mellitus in addition to hypernatremia.   Assessment & Plan:   Principal Problem:   Acute kidney injury (HCC) Active Problems:   Diabetes mellitus type 2, uncontrolled (HCC)   Dementia   H/O: CVA (cerebrovascular accident)   UTI (urinary tract infection)   Elevated lactic acid level   Acute kidney injury Baseline  Hypernatremia Improving but still significantly elevated -BMP q12 hours -switch to 1/2 NS fluids as blood sugars becoming more and more difficult to manage -encourage more oral intake to allow for correction as an outpatient.  Urinary tract infection Urine culture pending. -continue ceftriaxone   ?Urinary retention Patient evacuated 1L of urine with an in/out catheter but has had urine output in his underwear. Unable to quantify. -Post-void residual if able to obtain  Elevated lactic acid Secondary to dehydration. Improved with third bolus.  Diabetes mellitus, type 2 Still elevated. Made worse with D5 fluids. Hypoglycemic episode but is now hyperglycemic. Has started taking more food by mouth. Hemoglobin A1C of 8.7 -Lantus 5u qHS -SSI sensitive  Dementia Depression Demential appears advanced as patient is not have good oral intake. Possible this could be related to acute infection/significant dehydration, but seems unlikely with patient's overall weight and nutrition status. Improved mental status. -continue celexa -outpatient palliative care  History of CVA -continue ASA -continue atorvastatin   DVT prophylaxis: Lovenox Code Status: DNR Family Communication: None at bedside. Disposition Plan: Discharge home tomorrow pending improvement  of hypernatremia   Consultants:   None  Procedures:  None  Antimicrobials:  None    Subjective: Patient reports no pain today.   Objective: Vitals:   06/09/16 1430 06/09/16 2004 06/10/16 0457 06/10/16 0800  BP: 109/60 139/81 (!) 141/82 100/72  Pulse: 86 85 100 80  Resp: 20 16 18    Temp: 97.9 F (36.6 C) 97.8 F (36.6 C) 99.3 F (37.4 C) 97.8 F (36.6 C)  TempSrc: Oral Axillary Oral Axillary  SpO2: 100% 100% 100% 100%  Weight:        Intake/Output Summary (Last 24 hours) at 06/10/16 1326 Last data filed at 06/10/16 1000  Gross per 24 hour  Intake             3685 ml  Output              200 ml  Net             3485 ml   Filed Weights   06/09/16 0419  Weight: 47.6 kg (104 lb 15 oz)    Examination:  General exam: Appears calm and comfortable  Respiratory system: Clear to auscultation. Respiratory effort normal. Cardiovascular system: S1 & S2 heard, RRR. No murmurs, rubs, gallops or clicks. Gastrointestinal system: Abdomen is nondistended, soft and nontender. Normal bowel sounds heard. Central nervous system: Alert and oriented to person only Extremities: No edema. No calf tenderness Skin: No cyanosis. No rashes Psychiatry: Judgement and insight appear impaired. Mood & affect depressed and flat    Data Reviewed: I have personally reviewed following labs and imaging studies  CBC:  Recent Labs Lab 06/08/16 1335 06/09/16 0440  WBC 11.4* 10.9*  NEUTROABS 7.8*  --   HGB 12.9* 10.4*  HCT 41.3 34.0*  MCV 94.3 95.0  PLT  372 329   Basic Metabolic Panel:  Recent Labs Lab 06/08/16 1335 06/08/16 2227 06/09/16 0440 06/09/16 1652 06/10/16 0409 06/10/16 1217  NA 153* 160* 156* 151* 154*  --   K 5.4* 3.6 4.4 3.8 4.0  --   CL 116* 129* 126* 121* 121*  --   CO2 27 25 23 23 24   --   GLUCOSE 622* 242* 253* 378* 101* 449*  BUN 54* 41* 38* 35* 31*  --   CREATININE 1.80* 1.14 1.08 1.26* 1.06  --   CALCIUM 9.9 8.8* 8.6* 8.7* 9.0  --    GFR: Estimated  Creatinine Clearance: 38 mL/min (by C-G formula based on SCr of 1.06 mg/dL). Liver Function Tests:  Recent Labs Lab 06/08/16 1335  AST 37  ALT 119*  ALKPHOS 98  BILITOT 0.4  PROT 9.6*  ALBUMIN 3.3*   No results for input(s): LIPASE, AMYLASE in the last 168 hours. No results for input(s): AMMONIA in the last 168 hours. Coagulation Profile: No results for input(s): INR, PROTIME in the last 168 hours. Cardiac Enzymes: No results for input(s): CKTOTAL, CKMB, CKMBINDEX, TROPONINI in the last 168 hours. BNP (last 3 results) No results for input(s): PROBNP in the last 8760 hours. HbA1C:  Recent Labs  06/08/16 2227  HGBA1C 8.7*   CBG:  Recent Labs Lab 06/09/16 2126 06/09/16 2155 06/10/16 0336 06/10/16 0727 06/10/16 1154  GLUCAP 58* 73 86 121* 421*   Lipid Profile: No results for input(s): CHOL, HDL, LDLCALC, TRIG, CHOLHDL, LDLDIRECT in the last 72 hours. Thyroid Function Tests: No results for input(s): TSH, T4TOTAL, FREET4, T3FREE, THYROIDAB in the last 72 hours. Anemia Panel: No results for input(s): VITAMINB12, FOLATE, FERRITIN, TIBC, IRON, RETICCTPCT in the last 72 hours. Sepsis Labs:  Recent Labs Lab 06/08/16 1354 06/08/16 1659 06/08/16 2227  LATICACIDVEN 2.43* 2.91* 2.4*    Recent Results (from the past 240 hour(s))  Urine culture     Status: Abnormal   Collection Time: 06/08/16  4:22 PM  Result Value Ref Range Status   Specimen Description URINE, CLEAN CATCH  Final   Special Requests NONE  Final   Culture (A)  Final    <10,000 COLONIES/mL INSIGNIFICANT GROWTH Performed at [facility], [address] [place], [place] [address]    Report Status 06/10/2016 FINAL  Final         Radiology Studies: Dg Chest 2 View  Result Date: 06/08/2016 CLINICAL DATA:  Hyperglycemia.  Dementia. EXAM: CHEST  2 VIEW COMPARISON:  Single-view of the chest 05/28/2016 and 07/04/2015. FINDINGS: Lungs are clear. Heart size is normal. No pneumothorax or pleural  effusion. No acute bony abnormality. IMPRESSION: No acute disease. Electronically Signed   By: Drusilla Kannerhomas  Dalessio M.D.   On: 06/08/2016 14:33        Scheduled Meds: . aspirin  325 mg Oral Daily  . atorvastatin  20 mg Oral Daily  . cefTRIAXone (ROCEPHIN)  IV  1 g Intravenous Q24H  . cholecalciferol  1,000 Units Oral Daily  . citalopram  10 mg Oral QHS  . enoxaparin (LOVENOX) injection  30 mg Subcutaneous Q24H  . folic acid  1 mg Oral Daily  . insulin aspart  0-5 Units Subcutaneous QHS  . insulin aspart  0-9 Units Subcutaneous TID WC  . insulin glargine  5 Units Subcutaneous QHS  . lactose free nutrition  237 mL Oral BID BM  . memantine  10 mg Oral BID  . multivitamin with minerals  1 tablet Oral Daily  .  thiamine  100 mg Oral Daily   Continuous Infusions: . sodium chloride 100 mL/hr at 06/10/16 0603     LOS: 2 days     Jacquelin Hawking [facility] 06/10/2016, 1:26 PM Pager: [phone]  If 7PM-7AM, please contact night-coverage [email or web address] Password [email or web address] 06/10/2016, 1:26 PM

## 2016-06-10 NOTE — Progress Notes (Signed)
MD notified of  CBG of 421, order was placed for stat lab glucose check as per instructions in MAR.

## 2016-06-10 NOTE — Clinical Social Work Note (Signed)
MSW has attempted to contact patient's sister, [name] in reference to discharge planning/[facility] for today, 2/23. MSW left a message for a returned phone call.   Derenda FennelBashira Shaday Rayborn, MSW [phone] 06/10/2016 11:11 AM

## 2016-06-10 NOTE — Progress Notes (Signed)
Hypoglycemic Event  CBG:58       Treatment: 15 GM carbohydrate snack  Symptoms: None           Follow-up CBG: Time:2155 CBG Result:73     Possile Reasons for Event: Inadequate meal intake  Comments/MD notified: no new orders   Shraddha Lebron, Nolon LennertLinda Diane

## 2016-06-10 NOTE — Clinical Social Work Note (Signed)
Clinical Social Work Assessment  Patient Details  Name: Eric Cameron MRN: [id number] Date of Birth: 1935-03-02  Date of referral:  06/10/16               Reason for consult:  Discharge Planning                Permission sought to share information with:  Family Supports, Magazine features editoracility Contact Representative, Case Estate manager/land agentManager Permission granted to share information::  Yes, Verbal Permission Granted  Name::      Eric Cameron(Eric Cameron )  Agency::   ([facility] )  Relationship::   (Sister )  Contact Information:   [phone])  Housing/Transportation Living arrangements for the past 2 months:  Single Family Home, Skilled Nursing Facility Source of Information:  Other (Comment Required) (Sister ) Patient Interpreter Needed:  None Criminal Activity/Legal Involvement Pertinent to Current Situation/Hospitalization:  No - Comment as needed Significant Relationships:  Siblings Lives with:  Siblings Do you feel safe going back to the place where you live?  Yes Need for family participation in patient care:  Yes (Comment)  Care giving concerns:  [facility] after one week of STR.    Social Worker assessment / plan: MSW spoke with patient's sister, Eric Austrialeanor in reference to [facility]. Patient's sister reported that she is agreeable to patient returning to SNF and has been receiving update from MD. No further concerns reported at this time by pt's sister. Family and facility aware of possible dc for today, 2/23. MSW remains available as needed.   Employment status:  Retired Health and safety inspectornsurance information:  Armed forces operational officerMedicare, Medicaid In [place] PT Recommendations:  Not assessed at this time Information / Referral to community resources:  Skilled Nursing Facility  Patient/Family's Response to care:  Patient oriented to person only. Sister agreeable to [facility]. Patient's sister supportive and involved in  pt's care. Sister appreciated social work intervention.   Patient/Family's Understanding of and Emotional Response to Diagnosis, Current Treatment, and Prognosis:  Pt's sister knowledgeable of medical intervention through updates from MD.   Emotional Assessment Appearance:  Appears stated age Attitude/Demeanor/Rapport:  Unable to Assess Affect (typically observed):  Unable to Assess Orientation:  Oriented to Self, Oriented to Place Alcohol / Substance use:  Not Applicable Psych involvement (Current and /or in the community):  No (Comment)  Discharge Needs  Concerns to be addressed:    Readmission within the last 30 days:    Current discharge risk:  Cognitively Impaired Barriers to Discharge:  Continued Medical Work up   Derenda FennelNixon, Patra Gherardi A 06/10/2016, 12:44 PM

## 2016-06-10 NOTE — Progress Notes (Signed)
MD instructed to cover pt with 9 units of Novolog for blood sugar of 421.

## 2016-06-11 LAB — BASIC METABOLIC PANEL
ANION GAP: 6 (ref 5–15)
BUN: 26 mg/dL — ABNORMAL HIGH (ref 6–20)
CALCIUM: 8.9 mg/dL (ref 8.9–10.3)
CO2: 25 mmol/L (ref 22–32)
CREATININE: 0.89 mg/dL (ref 0.61–1.24)
Chloride: 117 mmol/L — ABNORMAL HIGH (ref 101–111)
Glucose, Bld: 162 mg/dL — ABNORMAL HIGH (ref 65–99)
Potassium: 4.4 mmol/L (ref 3.5–5.1)
SODIUM: 148 mmol/L — AB (ref 135–145)

## 2016-06-11 LAB — GLUCOSE, CAPILLARY
GLUCOSE-CAPILLARY: 257 mg/dL — AB (ref 65–99)
Glucose-Capillary: 147 mg/dL — ABNORMAL HIGH (ref 65–99)

## 2016-06-11 MED ORDER — INSULIN GLARGINE 100 UNIT/ML ~~LOC~~ SOLN: 5.0000 [IU] | Freq: Every day | SUBCUTANEOUS | Status: DC

## 2016-06-11 NOTE — Discharge Summary (Signed)
Physician Discharge Summary  Eric Cameron ZOX:[id number] DOB: 1934/12/02 DOA: 06/08/2016  PCP: [name], MD  Admit date: 06/08/2016 Discharge date: 06/11/2016  Admitted From: SNF Disposition: SNF  Recommendations for Outpatient Follow-up:  1. Follow up with PCP in 1 week 2. Patient needs good oral intake to maintain hydration 3. Consider repeat BMP to recheck sodium if not eating adequately 4. Diabetes management with Lantus started   Discharge Condition: Stable CODE STATUS: DNR Diet recommendation: Dysphagia 1 (puree); thin liquid   Brief/Interim Summary:  Chief Complaint: Hyperglycemia  HPI: Eric Cameron is a 82 y.o. male with medical history significant of dementia, diabetes mellitus, hypernatremia, CVA. He presents with hyperglycemia from his nursing facility. Patient is unable to provide history secondary to dementia. Per chart review and sign out report, patient's blood sugar this morning was 600 at the nursing facility and he was transported to the [facility] ED for evaluation and treatment. He has been baseline with regard to his mental status and is not taking much by mouth.  ED Course: Vitals: Afebrile, normal pulse, respirations and blood pressure. On room air. Labs: Blood glucose of 622 -> 267, Sodium of 153, potassium of 5.4, chloride of 116, BUN of 54, Creatinine of 1.80, ALT of 119, WBC of 11.4, lactic acid 2.43->2.91, troponin negative, urine significant for large leukocytes, Hgb/RBCs, budding yeast and WBC Imaging: CXR significant for no acute disease. Medications/Course: Ceftriaxone, 2L NS bolus, Novolog 8u. In/Out cath relieved about 1L of urine   Hospital course:  Acute kidney injury Secondary to prerenal azotemia from poor oral intake. Initial creatinine of 1.80 improved to 0.89 before discharge.  Hypernatremia Elevated secondary to poor oral intake likely in the setting of severe hyperglycemia. Patient given D5 fluids which were then switched to  1/2NS fluids with good reduction of sodium to baseline. Mental status improved with correction as well.Marland Kitchen  ?Urinary tract infection Ruled out. Urine culture with insignificant growth. Ceftriaxone was started empirically and discontinued on discharge.  ?Urinary retention Patient evacuated 1L of urine with an in/out catheter but has had urine output in his underwear. Unable to quantify at the time. No foley placed and patient had recurrent voids.  Elevated lactic acid Secondary to dehydration. Improved with IV fluids boluses.  Severe hyperglycemia Diabetes mellitus, type 2 Patient was not on diabetes therapy as an outpatient which likely was the cause of his hyperglycemia. Improved with insulin. Started on Lantus 5 units and sliding scale. He had one episode of hypoglycemia secondary to poor oral intake, which improved. He then started to have elevated uncontrolled blood sugars secondary to D5 fluids. These were discontinued and his blood sugar remained stable with Lantus and sliding scale. Recommend continuing Lantus as an outpatient.  Dementia Depression Continued Celexa.  History of CVA Continued ASA and atorvastatin   Discharge Diagnoses:  Principal Problem:   Acute kidney injury (HCC) Active Problems:   Diabetes mellitus type 2, uncontrolled (HCC)   Dementia   H/O: CVA (cerebrovascular accident)   UTI (urinary tract infection)   Elevated lactic acid level    Discharge Instructions   Allergies as of 06/11/2016   No Known Allergies     Medication List    TAKE these medications   aspirin 325 MG tablet Take 1 tablet (325 mg total) by mouth daily.   atorvastatin 20 MG tablet Commonly known as:  LIPITOR Take 20 mg by mouth daily.   cholecalciferol 1000 units tablet Commonly known as:  VITAMIN D Take 1,000 Units by mouth  daily.   citalopram 10 MG tablet Commonly known as:  CELEXA Take 10 mg by mouth at bedtime.   folic acid 1 MG tablet Commonly known as:   FOLVITE Take 1 tablet (1 mg total) by mouth daily.   HIGH PROTEIN PO Take 1 Bottle by mouth 2 (two) times daily.   insulin glargine 100 UNIT/ML injection Commonly known as:  LANTUS Inject 0.05 mLs (5 Units total) into the skin at bedtime.   memantine 10 MG tablet Commonly known as:  NAMENDA Take 10 mg by mouth 2 (two) times daily.   multivitamin with minerals Tabs tablet Take 1 tablet by mouth daily.   NUTRITIONAL SUPPLEMENT PLUS Liqd Take 1 Bottle by mouth 2 (two) times daily.   thiamine 100 MG tablet Take 1 tablet (100 mg total) by mouth daily.      Follow-up Information    [name], MD. Schedule an appointment as soon as possible for a visit in 1 week(s).   Specialty:  Internal Medicine Contact information: [address] [place] [address] [phone]          No Known Allergies  Consultations:  None   Procedures/Studies: Dg Chest 2 View  Result Date: 06/08/2016 CLINICAL DATA:  Hyperglycemia.  Dementia. EXAM: CHEST  2 VIEW COMPARISON:  Single-view of the chest 05/28/2016 and 07/04/2015. FINDINGS: Lungs are clear. Heart size is normal. No pneumothorax or pleural effusion. No acute bony abnormality. IMPRESSION: No acute disease. Electronically Signed   By: Drusilla Kanner M.D.   On: 06/08/2016 14:33   Ct Head Wo Contrast  Result Date: 05/29/2016 CLINICAL DATA:  Acute encephalopathy. History of seizures, hypertension, diabetes, dementia, and Alzheimer's disease. EXAM: CT HEAD WITHOUT CONTRAST TECHNIQUE: Contiguous axial images were obtained from the base of the skull through the vertex without intravenous contrast. COMPARISON:  MRI brain 07/02/2015.  CT head 07/01/2015. FINDINGS: Brain: Diffuse cerebral atrophy. Ventricular dilatation consistent with central atrophy. Patchy low-attenuation changes in the deep white matter consistent with small vessel ischemia. Prominent calcification of the falx. No mass effect or midline shift. No abnormal  extra-axial fluid collections. Gray-white matter junctions are distinct. Basal cisterns are not effaced. No acute intracranial hemorrhage. Vascular: Vascular calcifications are present peer Skull: Normal. Negative for fracture or focal lesion. Sinuses/Orbits: No acute finding. Other: No significant changes since previous study. IMPRESSION: No acute intracranial abnormalities. Mild chronic atrophy and small vessel ischemic changes. Electronically Signed   By: Burman Nieves M.D.   On: 05/29/2016 00:37   Dg Chest Port 1 View  Result Date: 05/28/2016 CLINICAL DATA:  High fever and altered mental status. Patient was uncooperative. EXAM: PORTABLE CHEST 1 VIEW COMPARISON:  05/28/2016 at 1801 hours FINDINGS: Shallow inspiration. Heart size and pulmonary vascularity are normal. No focal airspace disease or consolidation in the lungs. No pneumothorax. Calcified and tortuous aorta. IMPRESSION: No evidence of active pulmonary disease. Electronically Signed   By: Burman Nieves M.D.   On: 05/28/2016 23:23   Dg Chest Port 1 View  Result Date: 05/28/2016 CLINICAL DATA:  Acute mental status change.  Fever. EXAM: PORTABLE CHEST 1 VIEW COMPARISON:  July 04, 2015 FINDINGS: The study is markedly limited due to positioning. The patient was unable/unwilling to cooperate. Within this limitation, no pneumothorax. The right lateral lower lung was not imaged. Within visualize limits, no pneumonia identified. The cardiomediastinal silhouette is unchanged given difference in positioning. IMPRESSION: Markedly limited study as above.  No acute abnormalities are seen. Electronically Signed   By: Onalee Hua  Judithe ModestWilliams III M.D   On: 05/28/2016 18:15       Subjective: Patient reports no pain. Afebrile.  Discharge Exam: Vitals:   06/10/16 2148 06/11/16 0456  BP: 99/64 (!) 106/55  Pulse: 85 86  Resp: (!) 22 (!) 22  Temp: 99.9 F (37.7 C) 99.3 F (37.4 C)   Vitals:   06/10/16 0800 06/10/16 1400 06/10/16 2148 06/11/16 0456   BP: 100/72 113/60 99/64 (!) 106/55  Pulse: 80 71 85 86  Resp:   (!) 22 (!) 22  Temp: 97.8 F (36.6 C) 98.6 F (37 C) 99.9 F (37.7 C) 99.3 F (37.4 C)  TempSrc: Axillary Oral Oral Oral  SpO2: 100% 100% 100% 100%  Weight:        General exam: Appears calm and comfortable  ENT: no thrush. Very poor dentition with carries Respiratory system: Clear to auscultation. Respiratory effort normal. Cardiovascular system: S1 & S2 heard, RRR. No murmurs, rubs, gallops or clicks. Gastrointestinal system: Abdomen is nondistended, soft and nontender. Normal bowel sounds heard. Central nervous system: Alert and oriented to person only Extremities: No edema. No calf tenderness Skin: No cyanosis. No rashes Psychiatry: Judgement and insight appear impaired. Mood & affect depressed and flat   The results of significant diagnostics from this hospitalization (including imaging, microbiology, ancillary and laboratory) are listed below for reference.     Microbiology: Recent Results (from the past 240 hour(s))  Urine culture     Status: Abnormal   Collection Time: 06/08/16  4:22 PM  Result Value Ref Range Status   Specimen Description URINE, CLEAN CATCH  Final   Special Requests NONE  Final   Culture (A)  Final    <10,000 COLONIES/mL INSIGNIFICANT GROWTH Performed at [facility], [address] [place], [place] [address]    Report Status 06/10/2016 FINAL  Final     Labs: BNP (last 3 results) No results for input(s): BNP in the last 8760 hours. Basic Metabolic Panel:  Recent Labs Lab 06/09/16 0440 06/09/16 1652 06/10/16 0409 06/10/16 1217 06/10/16 1645 06/11/16 0445  NA 156* 151* 154*  --  146* 148*  K 4.4 3.8 4.0  --  3.8 4.4  CL 126* 121* 121*  --  118* 117*  CO2 23 23 24   --  22 25  GLUCOSE 253* 378* 101* 449* 133* 162*  BUN 38* 35* 31*  --  30* 26*  CREATININE 1.08 1.26* 1.06  --  0.90 0.89  CALCIUM 8.6* 8.7* 9.0  --  8.8* 8.9   Liver Function Tests:  Recent  Labs Lab 06/08/16 1335  AST 37  ALT 119*  ALKPHOS 98  BILITOT 0.4  PROT 9.6*  ALBUMIN 3.3*   No results for input(s): LIPASE, AMYLASE in the last 168 hours. No results for input(s): AMMONIA in the last 168 hours. CBC:  Recent Labs Lab 06/08/16 1335 06/09/16 0440  WBC 11.4* 10.9*  NEUTROABS 7.8*  --   HGB 12.9* 10.4*  HCT 41.3 34.0*  MCV 94.3 95.0  PLT 372 329   Cardiac Enzymes: No results for input(s): CKTOTAL, CKMB, CKMBINDEX, TROPONINI in the last 168 hours. BNP: Invalid input(s): POCBNP CBG:  Recent Labs Lab 06/10/16 1154 06/10/16 1414 06/10/16 1714 06/10/16 2145 06/11/16 0753  GLUCAP 421* 363* 107* 162* 147*   D-Dimer No results for input(s): DDIMER in the last 72 hours. Hgb A1c  Recent Labs  06/08/16 2227  HGBA1C 8.7*   Lipid Profile No results for input(s): CHOL, HDL, LDLCALC, TRIG, CHOLHDL,  LDLDIRECT in the last 72 hours. Thyroid function studies No results for input(s): TSH, T4TOTAL, T3FREE, THYROIDAB in the last 72 hours.  Invalid input(s): FREET3 Anemia work up No results for input(s): VITAMINB12, FOLATE, FERRITIN, TIBC, IRON, RETICCTPCT in the last 72 hours. Urinalysis    Component Value Date/Time   COLORURINE YELLOW 06/08/2016 1622   APPEARANCEUR TURBID (A) 06/08/2016 1622   LABSPEC 1.013 06/08/2016 1622   PHURINE 6.0 06/08/2016 1622   GLUCOSEU >=500 (A) 06/08/2016 1622   HGBUR MODERATE (A) 06/08/2016 1622   BILIRUBINUR NEGATIVE 06/08/2016 1622   KETONESUR NEGATIVE 06/08/2016 1622   PROTEINUR 100 (A) 06/08/2016 1622   UROBILINOGEN 1.0 11/30/2013 1734   NITRITE NEGATIVE 06/08/2016 1622   LEUKOCYTESUR LARGE (A) 06/08/2016 1622   Sepsis Labs Invalid input(s): PROCALCITONIN,  WBC,  LACTICIDVEN Microbiology Recent Results (from the past 240 hour(s))  Urine culture     Status: Abnormal   Collection Time: 06/08/16  4:22 PM  Result Value Ref Range Status   Specimen Description URINE, CLEAN CATCH  Final   Special Requests NONE  Final    Culture (A)  Final    <10,000 COLONIES/mL INSIGNIFICANT GROWTH Performed at [facility], [address], [place], [place] [address]    Report Status 06/10/2016 FINAL  Final     Time coordinating discharge: Over 30 minutes  SIGNED:   Jacquelin Hawking, MD [facility] 06/11/2016, 10:43 AM Pager [phone]  If 7PM-7AM, please contact night-coverage [email or web address] Password [email or web address]

## 2016-06-11 NOTE — Progress Notes (Addendum)
Assessment unchanged. VSS. [facility] here to [facility]. Unable to review AVS with pt due to dementia and confusion. Copy of AVS given to EMS attendants along with packet prepared by SW. Discharged via stretcher accompanied by [facility] attendants x 2.  Report called to [facility] shortly after pt left and given to MelbourneRochelle, LPN.

## 2016-06-11 NOTE — Care Management Note (Signed)
Case Management Note  Patient Details  Name: Cornelia CopaRobert Fuster MRN: [id number] Date of Birth: 05/23/1934  Subjective/Objective:     hyperglycemia                 Action/Plan: Discharge Planning: AVS reviewed:  Chart reviewed. CSW following for SNF placement. Scheduled dc today.    PCP Merrilee SeashoreALEXANDER, ANNE D MD   Expected Discharge Date:  06/11/16               Expected Discharge Plan:  Skilled Nursing Facility  In-House Referral:  Clinical Social Work  Discharge planning Services  CM Consult  Post Acute Care Choice:  NA Choice offered to:  NA  DME Arranged:  N/A DME Agency:  NA  HH Arranged:  NA HH Agency:  NA  Status of Service:  Completed, signed off  If discussed at Long Length of Stay Meetings, dates discussed:    Additional Comments:  [name], Antwyne Pingree Ellen, RN 06/11/2016, 1:21 PM

## 2016-06-11 NOTE — Clinical Social Work Note (Signed)
Medical Social Worker facilitated patient discharge including contacting patient family and facility to confirm patient discharge plans.  Clinical information faxed to facility and family agreeable with plan.  MSW arranged ambulance transport via [facility].  RN to call report prior to discharge.  Medical Social Worker will sign off for now as social work intervention is no longer needed. Please consult us again if new need arises.  Eric Cameron, MSW [phone] 06/11/2016 1:16 PM

## 2016-06-11 NOTE — Care Management Important Message (Addendum)
Important Message  Patient Details  Name: Eric Cameron MRN: [id number] Date of Birth: 16-Jan-1935   Medicare Important Message Given:  Yes  Explained document, pt unable to sign.   [name], [name], RN 06/11/2016, 1:16 PM

## 2016-06-13 ENCOUNTER — Non-Acute Institutional Stay (SKILLED_NURSING_FACILITY): Payer: Medicaid Other | Admitting: Internal Medicine

## 2016-06-13 ENCOUNTER — Encounter: Payer: Self-pay | Admitting: Internal Medicine

## 2016-06-13 DIAGNOSIS — Z794 Long term (current) use of insulin: Secondary | ICD-10-CM | POA: Diagnosis not present

## 2016-06-13 DIAGNOSIS — E782 Mixed hyperlipidemia: Secondary | ICD-10-CM

## 2016-06-13 DIAGNOSIS — R627 Adult failure to thrive: Secondary | ICD-10-CM | POA: Diagnosis not present

## 2016-06-13 DIAGNOSIS — R569 Unspecified convulsions: Secondary | ICD-10-CM | POA: Diagnosis not present

## 2016-06-13 DIAGNOSIS — Z8673 Personal history of transient ischemic attack (TIA), and cerebral infarction without residual deficits: Secondary | ICD-10-CM

## 2016-06-13 DIAGNOSIS — I10 Essential (primary) hypertension: Secondary | ICD-10-CM | POA: Diagnosis not present

## 2016-06-13 DIAGNOSIS — F32A Depression, unspecified: Secondary | ICD-10-CM

## 2016-06-13 DIAGNOSIS — G308 Other Alzheimer's disease: Secondary | ICD-10-CM

## 2016-06-13 DIAGNOSIS — E1165 Type 2 diabetes mellitus with hyperglycemia: Secondary | ICD-10-CM | POA: Diagnosis not present

## 2016-06-13 DIAGNOSIS — G309 Alzheimer's disease, unspecified: Secondary | ICD-10-CM

## 2016-06-13 DIAGNOSIS — R5381 Other malaise: Secondary | ICD-10-CM | POA: Diagnosis not present

## 2016-06-13 DIAGNOSIS — F0281 Dementia in other diseases classified elsewhere with behavioral disturbance: Secondary | ICD-10-CM | POA: Diagnosis not present

## 2016-06-13 DIAGNOSIS — F329 Major depressive disorder, single episode, unspecified: Secondary | ICD-10-CM | POA: Diagnosis not present

## 2016-06-13 NOTE — Progress Notes (Signed)
Patient ID: Eric Cameron, male   DOB: 1934-12-18, 82 y.o.   MRN: [id number]    HISTORY AND PHYSICAL   DATE: 06/13/2016  Location:     [facility] Room Number: 786 B Place of Service: SNF (31)   Extended Emergency Contact Information Primary Emergency Contact: [name],[name] Address: [address]          [place] [address] [place] Phone: [phone] Mobile Phone: [phone] Relation: Sister  Advanced Directive information Does Patient Have a Medical Advance Directive?: Yes, Type of Advance Directive: Out of facility DNR (pink MOST or yellow form), Pre-existing out of facility DNR order (yellow form or pink MOST form): Pink MOST form placed in chart (order not valid for inpatient use);Yellow form placed in chart (order not valid for inpatient use)  Chief Complaint  Patient presents with  . Readmit To SNF    Readmit from hospital    HPI:  82 yo male long term resident seen today for readmission into SNF following hospital stay for hyperglycemia 2/2 uncontrolled DM, AKI, hypernatremia, possible UTI, dementia, depression, hx CVA, hyperlipidemia. He presented to the ED with elevated blood sugar (600). He was tx with IVF and lantus. He was given IV rocephin for ? UTI but urine cx neg and rocephin stopped. Lactic acidosis 2.43-->2.4; WBC 11.4K -->10.9K; abs neutrophils 7.8K; Hgb 12.9-->10.4; A1c 8.7%; serum glucose 676-->162; K 5.4-->4.4; albumin 3.3; Na peaked at 160-->148; Cr 1.8-->0.89; urine Na level 91 at d/c. He presents to SNF for short term rehab.  Today he reports he fell and now has abrasion to upper back. No f/c.  He had a fall on yesterday. No injury. He is a poor historian due to dementia. Hx obtained from chart.  Alzheimer's dementia/FTT - albumin 3.3. Weight 109.2 lbs (prev 108). He takes namenda. He gets nutritional supplements + folate/thiamine  DM - now on Lantus and SSI. CBG 307 today.  HTN - diet controlled. Takes ASA  daily  sz d/o - recent EEG revealed no epileptic changes. He does not take any meds  Hyperlipidemia - stable on lipitor  Hx CVA - stable on ASA daily  Depression - stable on citalopram  Past Medical History:  Diagnosis Date  . Acute encephalopathy   . Alzheimer disease   . Dehydration   . Dementia   . Depression   . Diabetes mellitus without complication ([place])   . Elevated troponin   . Hypertension   . PNA (pneumonia)   . Pressure ulcer   . Seizures ([place])     Past Surgical History:  Procedure Laterality Date  . NO PAST SURGERIES      Patient Care Team: Gildardo Cranker, DO as PCP - General (Internal Medicine)  Social History   Social History  . Marital status: Single    Spouse name: N/A  . Number of children: 0  . Years of education: 4   Occupational History  . retired    Social History Main Topics  . Smoking status: Former Smoker    Quit date: 07/07/2010  . Smokeless tobacco: Never Used  . Alcohol use No  . Drug use: No  . Sexual activity: Not on file   Other Topics Concern  . Not on file   Social History Narrative   Lives with sister and brother   Caffeine use: Tea    Very little coffee   Soda sometimes     reports that he quit smoking about 5 years ago. He has never  used smokeless tobacco. He reports that he does not drink alcohol or use drugs.  Family History  Problem Relation Age of Onset  . Dementia Mother   . Colon cancer Father   . Diabetes Mellitus II Brother    Family Status  Relation Status  . Mother Deceased at age 47  . Father Deceased  . Sister Alive  . Brother     Immunization History  Administered Date(s) Administered  . Influenza,inj,Quad PF,36+ Mos 07/07/2015  . PPD Test 07/10/2015, 06/04/2016    No Known Allergies  Medications: Patient's Medications  New Prescriptions   No medications on file  Previous Medications   AMINO ACIDS (HIGH PROTEIN PO)    Take 30 mLs by mouth 2 (two) times daily.    ASPIRIN 325 MG  TABLET    Take 1 tablet (325 mg total) by mouth daily.   ATORVASTATIN (LIPITOR) 20 MG TABLET    Take 20 mg by mouth daily.   CHOLECALCIFEROL (VITAMIN D) 1000 UNITS TABLET    Take 1,000 Units by mouth daily.   CITALOPRAM (CELEXA) 10 MG TABLET    Take 10 mg by mouth at bedtime.    FOLIC ACID (FOLVITE) 1 MG TABLET    Take 1 tablet (1 mg total) by mouth daily.   INSULIN GLARGINE (LANTUS) 100 UNIT/ML INJECTION    Inject 0.05 mLs (5 Units total) into the skin at bedtime.   MEMANTINE (NAMENDA) 10 MG TABLET    Take 10 mg by mouth 2 (two) times daily.    MULTIPLE VITAMIN (MULTIVITAMIN WITH MINERALS) TABS TABLET    Take 1 tablet by mouth daily.   NUTRITIONAL SUPPLEMENTS (NUTRITIONAL SUPPLEMENT PLUS) LIQD    Take 1 Bottle by mouth 2 (two) times daily. Med Pass 120 mL   THIAMINE 100 MG TABLET    Take 1 tablet (100 mg total) by mouth daily.  Modified Medications   No medications on file  Discontinued Medications   No medications on file    Review of Systems  Unable to perform ROS: Dementia    Vitals:   06/13/16 1000  BP: 137/79  Pulse: 89  Resp: 18  Temp: 98.3 F (36.8 C)  TempSrc: Oral  SpO2: 95%  Weight: 109 lb 3.2 oz (49.5 kg)  Height: _0  (1.727 m)   Body mass index is 16.6 kg/m.  Physical Exam  Constitutional: He appears well-developed.  Frail appearing sitting in w/c in NAD  HENT:  Mouth/Throat: Oropharynx is clear and moist.  MMM; no oral thrush  Eyes: Pupils are equal, round, and reactive to light. No scleral icterus.  Neck: Neck supple. Carotid bruit is not present. No thyromegaly present.  Cardiovascular: Normal rate, regular rhythm and intact distal pulses.  Exam reveals no gallop and no friction rub.   Murmur (1/6 SEM) heard. no distal LE swelling. No calf TTP  Pulmonary/Chest: Effort normal. No stridor. He has no wheezes. He has no rales. He exhibits no tenderness.  Poor inspiratory effort  Abdominal: Soft. Bowel sounds are normal. He exhibits no distension, no  abdominal bruit, no pulsatile midline mass and no mass. There is no hepatomegaly. There is no tenderness. There is no rebound and no guarding.  Musculoskeletal: He exhibits edema.  Lymphadenopathy:    He has no cervical adenopathy.  Neurological: He is alert.  Skin: Skin is warm and dry. No rash noted.  Psychiatric: He has a normal mood and affect. His behavior is normal.     Labs reviewed: Nursing Home  on 06/13/2016  Component Date Value Ref Range Status  . Glucose 06/08/2016 676  mg/dL Final  . BUN 06/08/2016 45* 4 - 21 mg/dL Final  . Creatinine 06/08/2016 1.3  0.6 - 1.3 mg/dL Final  . Potassium 06/08/2016 5.0  3.4 - 5.3 mmol/L Final  . Sodium 06/08/2016 153* 137 - 147 mmol/L Final  Admission on 06/08/2016, Discharged on 06/11/2016  Component Date Value Ref Range Status  . WBC 06/08/2016 11.4* 4.0 - 10.5 K/uL Final  . RBC 06/08/2016 4.38  4.22 - 5.81 MIL/uL Final  . Hemoglobin 06/08/2016 12.9* 13.0 - 17.0 g/dL Final  . HCT 06/08/2016 41.3  39.0 - 52.0 % Final  . MCV 06/08/2016 94.3  78.0 - 100.0 fL Final  . MCH 06/08/2016 29.5  26.0 - 34.0 pg Final  . MCHC 06/08/2016 31.2  30.0 - 36.0 g/dL Final  . RDW 06/08/2016 14.0  11.5 - 15.5 % Final  . Platelets 06/08/2016 372  150 - 400 K/uL Final  . Neutrophils Relative % 06/08/2016 68  % Final  . Neutro Abs 06/08/2016 7.8* 1.7 - 7.7 K/uL Final  . Lymphocytes Relative 06/08/2016 18  % Final  . Lymphs Abs 06/08/2016 2.0  0.7 - 4.0 K/uL Final  . Monocytes Relative 06/08/2016 13  % Final  . Monocytes Absolute 06/08/2016 1.5* 0.1 - 1.0 K/uL Final  . Eosinophils Relative 06/08/2016 1  % Final  . Eosinophils Absolute 06/08/2016 0.1  0.0 - 0.7 K/uL Final  . Basophils Relative 06/08/2016 0  % Final  . Basophils Absolute 06/08/2016 0.0  0.0 - 0.1 K/uL Final  . Sodium 06/08/2016 153* 135 - 145 mmol/L Final  . Potassium 06/08/2016 5.4* 3.5 - 5.1 mmol/L Final  . Chloride 06/08/2016 116* 101 - 111 mmol/L Final  . CO2 06/08/2016 27  22 - 32  mmol/L Final  . Glucose, Bld 06/08/2016 622* 65 - 99 mg/dL Final   Comment: CRITICAL RESULT CALLED TO, READ BACK BY AND VERIFIED WITH: M.QUICK RN [place] [id number] A.[email or web address]   . BUN 06/08/2016 54* 6 - 20 mg/dL Final  . Creatinine, Ser 06/08/2016 1.80* 0.61 - 1.24 mg/dL Final  . Calcium 06/08/2016 9.9  8.9 - 10.3 mg/dL Final  . Total Protein 06/08/2016 9.6* 6.5 - 8.1 g/dL Final  . Albumin 06/08/2016 3.3* 3.5 - 5.0 g/dL Final  . AST 06/08/2016 37  15 - 41 U/L Final  . ALT 06/08/2016 119* 17 - 63 U/L Final  . Alkaline Phosphatase 06/08/2016 98  38 - 126 U/L Final  . Total Bilirubin 06/08/2016 0.4  0.3 - 1.2 mg/dL Final  . GFR calc non Af Amer 06/08/2016 34* >60 mL/min Final  . GFR calc Af Amer 06/08/2016 40* >60 mL/min Final   Comment: (NOTE) The eGFR has been calculated using the CKD EPI equation. This calculation has not been validated in all clinical situations. eGFR's persistently <60 mL/min signify possible Chronic Kidney Disease.   . Anion gap 06/08/2016 10  5 - 15 Final  . Lactic Acid, Venous 06/08/2016 2.43* 0.5 - 1.9 mmol/L Final  . Comment 06/08/2016 NOTIFIED PHYSICIAN   Final  . Troponin i, poc 06/08/2016 0.00  0.00 - 0.08 ng/mL Final  . Comment 3 06/08/2016          Final   Comment: Due to the release kinetics of cTnI, a negative result within the first hours of the onset of symptoms does not rule out myocardial infarction with certainty. If myocardial infarction is still suspected, repeat the test  at appropriate intervals.   . Color, Urine 06/08/2016 YELLOW  YELLOW Final  . APPearance 06/08/2016 TURBID* CLEAR Final  . Specific Gravity, Urine 06/08/2016 1.013  1.005 - 1.030 Final  . pH 06/08/2016 6.0  5.0 - 8.0 Final  . Glucose, UA 06/08/2016 >=500* NEGATIVE mg/dL Final  . Hgb urine dipstick 06/08/2016 MODERATE* NEGATIVE Final  . Bilirubin Urine 06/08/2016 NEGATIVE  NEGATIVE Final  . Ketones, ur 06/08/2016 NEGATIVE  NEGATIVE mg/dL Final  . Protein, ur 06/08/2016 100*  NEGATIVE mg/dL Final  . Nitrite 06/08/2016 NEGATIVE  NEGATIVE Final  . Leukocytes, UA 06/08/2016 LARGE* NEGATIVE Final  . RBC / HPF 06/08/2016 6-30  0 - 5 RBC/hpf Final  . WBC, UA 06/08/2016 TOO NUMEROUS TO COUNT  0 - 5 WBC/hpf Final  . Bacteria, UA 06/08/2016 RARE* NONE SEEN Final  . Squamous Epithelial / LPF 06/08/2016 NONE SEEN  NONE SEEN Final  . WBC Clumps 06/08/2016 PRESENT   Final  . Budding Yeast 06/08/2016 PRESENT   Final  . Glucose-Capillary 06/08/2016 520* 65 - 99 mg/dL Final  . Lactic Acid, Venous 06/08/2016 2.91* 0.5 - 1.9 mmol/L Final  . Comment 06/08/2016 NOTIFIED PHYSICIAN   Final  . Specimen Description 06/08/2016 URINE, CLEAN CATCH   Final  . Special Requests 06/08/2016 NONE   Final  . Culture 06/08/2016 *  Final                   Value:<10,000 COLONIES/mL INSIGNIFICANT GROWTH Performed at [facility], [place] [address] [place], [place] [address]   . Report Status 06/08/2016 06/10/2016 FINAL   Final  . Glucose-Capillary 06/08/2016 310* 65 - 99 mg/dL Final  . Glucose-Capillary 06/08/2016 267* 65 - 99 mg/dL Final  . Sodium 06/08/2016 160* 135 - 145 mmol/L Final  . Potassium 06/08/2016 3.6  3.5 - 5.1 mmol/L Final  . Chloride 06/08/2016 129* 101 - 111 mmol/L Final  . CO2 06/08/2016 25  22 - 32 mmol/L Final  . Glucose, Bld 06/08/2016 242* 65 - 99 mg/dL Final  . BUN 06/08/2016 41* 6 - 20 mg/dL Final  . Creatinine, Ser 06/08/2016 1.14  0.61 - 1.24 mg/dL Final  . Calcium 06/08/2016 8.8* 8.9 - 10.3 mg/dL Final  . GFR calc non Af Amer 06/08/2016 59* >60 mL/min Final  . GFR calc Af Amer 06/08/2016 >60  >60 mL/min Final   Comment: (NOTE) The eGFR has been calculated using the CKD EPI equation. This calculation has not been validated in all clinical situations. eGFR's persistently <60 mL/min signify possible Chronic Kidney Disease.   . Anion gap 06/08/2016 6  5 - 15 Final  . Sodium 06/09/2016 156* 135 - 145 mmol/L Final  . Potassium 06/09/2016 4.4  3.5 - 5.1 mmol/L  Final   Comment: DELTA CHECK NOTED REPEATED TO VERIFY SLIGHT HEMOLYSIS   . Chloride 06/09/2016 126* 101 - 111 mmol/L Final  . CO2 06/09/2016 23  22 - 32 mmol/L Final  . Glucose, Bld 06/09/2016 253* 65 - 99 mg/dL Final  . BUN 06/09/2016 38* 6 - 20 mg/dL Final  . Creatinine, Ser 06/09/2016 1.08  0.61 - 1.24 mg/dL Final  . Calcium 06/09/2016 8.6* 8.9 - 10.3 mg/dL Final  . GFR calc non Af Amer 06/09/2016 >60  >60 mL/min Final  . GFR calc Af Amer 06/09/2016 >60  >60 mL/min Final   Comment: (NOTE) The eGFR has been calculated using the CKD EPI equation. This calculation has not been validated in all clinical situations. eGFR's persistently <  60 mL/min signify possible Chronic Kidney Disease.   . Anion gap 06/09/2016 7  5 - 15 Final  . Hgb A1c MFr Bld 06/08/2016 8.7* 4.8 - 5.6 % Final   Comment: (NOTE)         Pre-diabetes: 5.7 - 6.4         Diabetes: >6.4         Glycemic control for adults with diabetes: <7.0   . Mean Plasma Glucose 06/08/2016 203   Final   Comment: (NOTE) Performed At: [facility] [place], [place] [id number] Lindon Romp MD CB:[id number]   . WBC 06/09/2016 10.9* 4.0 - 10.5 K/uL Final  . RBC 06/09/2016 3.58* 4.22 - 5.81 MIL/uL Final  . Hemoglobin 06/09/2016 10.4* 13.0 - 17.0 g/dL Final  . HCT 06/09/2016 34.0* 39.0 - 52.0 % Final  . MCV 06/09/2016 95.0  78.0 - 100.0 fL Final  . MCH 06/09/2016 29.1  26.0 - 34.0 pg Final  . MCHC 06/09/2016 30.6  30.0 - 36.0 g/dL Final  . RDW 06/09/2016 13.9  11.5 - 15.5 % Final  . Platelets 06/09/2016 329  150 - 400 K/uL Final  . Sodium, Ur 06/08/2016 91  mmol/L Final  . Creatinine, Urine 06/08/2016 27.97  mg/dL Final  . Lactic Acid, Venous 06/08/2016 2.4* 0.5 - 1.9 mmol/L Final   Comment: CRITICAL RESULT CALLED TO, READ BACK BY AND VERIFIED WITH: [place] [id number] @ 1062 BY J SCOTTON   . Glucose-Capillary 06/08/2016 216* 65 - 99 mg/dL Final  . Glucose-Capillary 06/09/2016 257* 65 - 99 mg/dL  Final  . Glucose-Capillary 06/09/2016 299* 65 - 99 mg/dL Final  . Sodium 06/09/2016 151* 135 - 145 mmol/L Final  . Potassium 06/09/2016 3.8  3.5 - 5.1 mmol/L Final  . Chloride 06/09/2016 121* 101 - 111 mmol/L Final  . CO2 06/09/2016 23  22 - 32 mmol/L Final  . Glucose, Bld 06/09/2016 378* 65 - 99 mg/dL Final  . BUN 06/09/2016 35* 6 - 20 mg/dL Final  . Creatinine, Ser 06/09/2016 1.26* 0.61 - 1.24 mg/dL Final  . Calcium 06/09/2016 8.7* 8.9 - 10.3 mg/dL Final  . GFR calc non Af Amer 06/09/2016 52* >60 mL/min Final  . GFR calc Af Amer 06/09/2016 >60  >60 mL/min Final   Comment: (NOTE) The eGFR has been calculated using the CKD EPI equation. This calculation has not been validated in all clinical situations. eGFR's persistently <60 mL/min signify possible Chronic Kidney Disease.   . Anion gap 06/09/2016 7  5 - 15 Final  . Glucose-Capillary 06/09/2016 330* 65 - 99 mg/dL Final  . Sodium 06/10/2016 154* 135 - 145 mmol/L Final  . Potassium 06/10/2016 4.0  3.5 - 5.1 mmol/L Final  . Chloride 06/10/2016 121* 101 - 111 mmol/L Final  . CO2 06/10/2016 24  22 - 32 mmol/L Final  . Glucose, Bld 06/10/2016 101* 65 - 99 mg/dL Final  . BUN 06/10/2016 31* 6 - 20 mg/dL Final  . Creatinine, Ser 06/10/2016 1.06  0.61 - 1.24 mg/dL Final  . Calcium 06/10/2016 9.0  8.9 - 10.3 mg/dL Final  . GFR calc non Af Amer 06/10/2016 >60  >60 mL/min Final  . GFR calc Af Amer 06/10/2016 >60  >60 mL/min Final   Comment: (NOTE) The eGFR has been calculated using the CKD EPI equation. This calculation has not been validated in all clinical situations. eGFR's persistently <60 mL/min signify possible Chronic Kidney Disease.   . Anion gap 06/10/2016 9  5 -  15 Final  . Glucose-Capillary 06/09/2016 58* 65 - 99 mg/dL Final  . Glucose-Capillary 06/09/2016 73  65 - 99 mg/dL Final  . Sodium 06/10/2016 146* 135 - 145 mmol/L Final  . Potassium 06/10/2016 3.8  3.5 - 5.1 mmol/L Final  . Chloride 06/10/2016 118* 101 - 111 mmol/L  Final  . CO2 06/10/2016 22  22 - 32 mmol/L Final  . Glucose, Bld 06/10/2016 133* 65 - 99 mg/dL Final  . BUN 06/10/2016 30* 6 - 20 mg/dL Final  . Creatinine, Ser 06/10/2016 0.90  0.61 - 1.24 mg/dL Final  . Calcium 06/10/2016 8.8* 8.9 - 10.3 mg/dL Final  . GFR calc non Af Amer 06/10/2016 >60  >60 mL/min Final  . GFR calc Af Amer 06/10/2016 >60  >60 mL/min Final   Comment: (NOTE) The eGFR has been calculated using the CKD EPI equation. This calculation has not been validated in all clinical situations. eGFR's persistently <60 mL/min signify possible Chronic Kidney Disease.   . Anion gap 06/10/2016 6  5 - 15 Final  . Glucose-Capillary 06/10/2016 86  65 - 99 mg/dL Final  . Glucose-Capillary 06/10/2016 121* 65 - 99 mg/dL Final  . Glucose-Capillary 06/10/2016 421* 65 - 99 mg/dL Final  . Glucose, Bld 06/10/2016 449* 65 - 99 mg/dL Final  . Glucose-Capillary 06/10/2016 363* 65 - 99 mg/dL Final  . Sodium 06/11/2016 148* 135 - 145 mmol/L Final  . Potassium 06/11/2016 4.4  3.5 - 5.1 mmol/L Final  . Chloride 06/11/2016 117* 101 - 111 mmol/L Final  . CO2 06/11/2016 25  22 - 32 mmol/L Final  . Glucose, Bld 06/11/2016 162* 65 - 99 mg/dL Final  . BUN 06/11/2016 26* 6 - 20 mg/dL Final  . Creatinine, Ser 06/11/2016 0.89  0.61 - 1.24 mg/dL Final  . Calcium 06/11/2016 8.9  8.9 - 10.3 mg/dL Final  . GFR calc non Af Amer 06/11/2016 >60  >60 mL/min Final  . GFR calc Af Amer 06/11/2016 >60  >60 mL/min Final   Comment: (NOTE) The eGFR has been calculated using the CKD EPI equation. This calculation has not been validated in all clinical situations. eGFR's persistently <60 mL/min signify possible Chronic Kidney Disease.   . Anion gap 06/11/2016 6  5 - 15 Final  . Glucose-Capillary 06/10/2016 107* 65 - 99 mg/dL Final  . Glucose-Capillary 06/10/2016 162* 65 - 99 mg/dL Final  . Glucose-Capillary 06/11/2016 147* 65 - 99 mg/dL Final  . Glucose-Capillary 06/11/2016 257* 65 - 99 mg/dL Final  Admission on  05/28/2016, Discharged on 06/03/2016  Component Date Value Ref Range Status  . Lactic Acid, Venous 05/28/2016 2.95* 0.5 - 1.9 mmol/L Final  . Comment 05/28/2016 NOTIFIED PHYSICIAN   Final  . WBC 05/28/2016 12.5* 4.0 - 10.5 K/uL Final  . RBC 05/28/2016 3.78* 4.22 - 5.81 MIL/uL Final  . Hemoglobin 05/28/2016 11.0* 13.0 - 17.0 g/dL Final  . HCT 05/28/2016 35.1* 39.0 - 52.0 % Final  . MCV 05/28/2016 92.9  78.0 - 100.0 fL Final  . MCH 05/28/2016 29.1  26.0 - 34.0 pg Final  . MCHC 05/28/2016 31.3  30.0 - 36.0 g/dL Final  . RDW 05/28/2016 14.2  11.5 - 15.5 % Final  . Platelets 05/28/2016 272  150 - 400 K/uL Final  . Neutrophils Relative % 05/28/2016 79  % Final  . Neutro Abs 05/28/2016 10.0* 1.7 - 7.7 K/uL Final  . Lymphocytes Relative 05/28/2016 15  % Final  . Lymphs Abs 05/28/2016 1.8  0.7 - 4.0 K/uL Final  .  Monocytes Relative 05/28/2016 6  % Final  . Monocytes Absolute 05/28/2016 0.7  0.1 - 1.0 K/uL Final  . Eosinophils Relative 05/28/2016 0  % Final  . Eosinophils Absolute 05/28/2016 0.0  0.0 - 0.7 K/uL Final  . Basophils Relative 05/28/2016 0  % Final  . Basophils Absolute 05/28/2016 0.0  0.0 - 0.1 K/uL Final  . Specimen Description 05/28/2016 BLOOD LEFT FOREARM   Final  . Special Requests 05/28/2016 BOTTLES DRAWN AEROBIC AND ANAEROBIC 5CC   Final  . Culture 05/28/2016 NO GROWTH 5 DAYS   Final  . Report Status 05/28/2016 06/02/2016 FINAL   Final  . Specimen Description 05/28/2016 BLOOD RIGHT HAND   Final  . Special Requests 05/28/2016 IN PEDIATRIC BOTTLE 2CC   Final  . Culture 05/28/2016 NO GROWTH 5 DAYS   Final  . Report Status 05/28/2016 06/02/2016 FINAL   Final  . Color, Urine 05/28/2016 YELLOW  YELLOW Final  . APPearance 05/28/2016 CLOUDY* CLEAR Final  . Specific Gravity, Urine 05/28/2016 1.006  1.005 - 1.030 Final  . pH 05/28/2016 7.0  5.0 - 8.0 Final  . Glucose, UA 05/28/2016 >=500* NEGATIVE mg/dL Final  . Hgb urine dipstick 05/28/2016 SMALL* NEGATIVE Final  . Bilirubin  Urine 05/28/2016 NEGATIVE  NEGATIVE Final  . Ketones, ur 05/28/2016 NEGATIVE  NEGATIVE mg/dL Final  . Protein, ur 05/28/2016 NEGATIVE  NEGATIVE mg/dL Final  . Nitrite 05/28/2016 NEGATIVE  NEGATIVE Final  . Leukocytes, UA 05/28/2016 LARGE* NEGATIVE Final  . RBC / HPF 05/28/2016 6-30  0 - 5 RBC/hpf Final  . WBC, UA 05/28/2016 TOO NUMEROUS TO COUNT  0 - 5 WBC/hpf Final  . Bacteria, UA 05/28/2016 RARE* NONE SEEN Final  . Squamous Epithelial / LPF 05/28/2016 0-5* NONE SEEN Final  . Mucous 05/28/2016 PRESENT   Final  . Budding Yeast 05/28/2016 PRESENT   Final  . Influenza A By PCR 05/28/2016 NEGATIVE  NEGATIVE Final  . Influenza B By PCR 05/28/2016 NEGATIVE  NEGATIVE Final   Comment: (NOTE) The Xpert Xpress Flu assay is intended as an aid in the diagnosis of  influenza and should not be used as a sole basis for treatment.  This  assay is FDA approved for nasopharyngeal swab specimens only. Nasal  washings and aspirates are unacceptable for Xpert Xpress Flu testing.   . Sodium 05/28/2016 146* 135 - 145 mmol/L Final  . Potassium 05/28/2016 3.3* 3.5 - 5.1 mmol/L Final  . Chloride 05/28/2016 103  101 - 111 mmol/L Final  . CO2 05/28/2016 30  22 - 32 mmol/L Final  . Glucose, Bld 05/28/2016 256* 65 - 99 mg/dL Final  . BUN 05/28/2016 17  6 - 20 mg/dL Final  . Creatinine, Ser 05/28/2016 0.80  0.61 - 1.24 mg/dL Final  . Calcium 05/28/2016 9.4  8.9 - 10.3 mg/dL Final  . Total Protein 05/28/2016 8.2* 6.5 - 8.1 g/dL Final  . Albumin 05/28/2016 3.1* 3.5 - 5.0 g/dL Final  . AST 05/28/2016 37  15 - 41 U/L Final  . ALT 05/28/2016 47  17 - 63 U/L Final  . Alkaline Phosphatase 05/28/2016 83  38 - 126 U/L Final  . Total Bilirubin 05/28/2016 0.4  0.3 - 1.2 mg/dL Final  . GFR calc non Af Amer 05/28/2016 >60  >60 mL/min Final  . GFR calc Af Amer 05/28/2016 >60  >60 mL/min Final   Comment: (NOTE) The eGFR has been calculated using the CKD EPI equation. This calculation has not been validated in all clinical  situations.  eGFR's persistently <60 mL/min signify possible Chronic Kidney Disease.   . Anion gap 05/28/2016 13  5 - 15 Final  . Lactic Acid, Venous 05/28/2016 1.96* 0.5 - 1.9 mmol/L Final  . Specimen Description 05/28/2016 URINE, RANDOM   Final  . Special Requests 05/28/2016 ADDED AT [id number] ON [id number]   Final  . Culture 05/28/2016 MULTIPLE SPECIES PRESENT, SUGGEST RECOLLECTION*  Final  . Report Status 05/28/2016 05/30/2016 FINAL   Final  . Lactic Acid, Venous 05/28/2016 2.1* 0.5 - 1.9 mmol/L Final   Comment: CRITICAL RESULT CALLED TO, READ BACK BY AND VERIFIED WITH: OLOKA,O RN 05/29/2016 0025 JORDANS   . Procalcitonin 05/28/2016 0.11  ng/mL Final   Comment:        Interpretation: PCT (Procalcitonin) <= 0.5 ng/mL: Systemic infection (sepsis) is not likely. Local bacterial infection is possible. (NOTE)         ICU PCT Algorithm               Non ICU PCT Algorithm    ----------------------------     ------------------------------         PCT < 0.25 ng/mL                 PCT < 0.1 ng/mL     Stopping of antibiotics            Stopping of antibiotics       strongly encouraged.               strongly encouraged.    ----------------------------     ------------------------------       PCT level decrease by               PCT < 0.25 ng/mL       >= 80% from peak PCT       OR PCT 0.25 - 0.5 ng/mL          Stopping of antibiotics                                             encouraged.     Stopping of antibiotics           encouraged.    ----------------------------     ------------------------------       PCT level decrease by              PCT >= 0.25 ng/mL       < 80% from peak PCT        AND PCT >= 0.5 ng/mL            Continuin                          g antibiotics                                              encouraged.       Continuing antibiotics            encouraged.    ----------------------------     ------------------------------     PCT level increase compared          PCT >  0.5 ng/mL         with peak PCT  AND          PCT >= 0.5 ng/mL             Escalation of antibiotics                                          strongly encouraged.      Escalation of antibiotics        strongly encouraged.   . WBC 05/29/2016 12.4* 4.0 - 10.5 K/uL Final  . RBC 05/29/2016 3.35* 4.22 - 5.81 MIL/uL Final  . Hemoglobin 05/29/2016 9.6* 13.0 - 17.0 g/dL Final  . HCT 05/29/2016 31.0* 39.0 - 52.0 % Final  . MCV 05/29/2016 92.5  78.0 - 100.0 fL Final  . MCH 05/29/2016 28.7  26.0 - 34.0 pg Final  . MCHC 05/29/2016 31.0  30.0 - 36.0 g/dL Final  . RDW 05/29/2016 14.5  11.5 - 15.5 % Final  . Platelets 05/29/2016 256  150 - 400 K/uL Final  . Neutrophils Relative % 05/29/2016 66  % Final  . Neutro Abs 05/29/2016 8.1* 1.7 - 7.7 K/uL Final  . Lymphocytes Relative 05/29/2016 24  % Final  . Lymphs Abs 05/29/2016 3.0  0.7 - 4.0 K/uL Final  . Monocytes Relative 05/29/2016 10  % Final  . Monocytes Absolute 05/29/2016 1.3* 0.1 - 1.0 K/uL Final  . Eosinophils Relative 05/29/2016 0  % Final  . Eosinophils Absolute 05/29/2016 0.0  0.0 - 0.7 K/uL Final  . Basophils Relative 05/29/2016 0  % Final  . Basophils Absolute 05/29/2016 0.0  0.0 - 0.1 K/uL Final  . Troponin I 05/28/2016 0.04* <0.03 ng/mL Final   Comment: CRITICAL RESULT CALLED TO, READ BACK BY AND VERIFIED WITH: OLOKA,O RN 05/29/2016 0025 JORDANS   . Troponin I 05/29/2016 0.05* <0.03 ng/mL Final  . Troponin I 05/29/2016 0.05* <0.03 ng/mL Final  . Sodium 05/29/2016 149* 135 - 145 mmol/L Final  . Potassium 05/29/2016 3.3* 3.5 - 5.1 mmol/L Final  . Chloride 05/29/2016 110  101 - 111 mmol/L Final  . CO2 05/29/2016 27  22 - 32 mmol/L Final  . Glucose, Bld 05/29/2016 196* 65 - 99 mg/dL Final  . BUN 05/29/2016 13  6 - 20 mg/dL Final  . Creatinine, Ser 05/29/2016 0.74  0.61 - 1.24 mg/dL Final  . Calcium 05/29/2016 8.5* 8.9 - 10.3 mg/dL Final  . GFR calc non Af Amer 05/29/2016 >60  >60 mL/min Final  . GFR calc Af Amer 05/29/2016 >60  >60  mL/min Final   Comment: (NOTE) The eGFR has been calculated using the CKD EPI equation. This calculation has not been validated in all clinical situations. eGFR's persistently <60 mL/min signify possible Chronic Kidney Disease.   . Anion gap 05/29/2016 12  5 - 15 Final  . MRSA by PCR 05/29/2016 NEGATIVE  NEGATIVE Final   Comment:        The GeneXpert MRSA Assay (FDA approved for NASAL specimens only), is one component of a comprehensive MRSA colonization surveillance program. It is not intended to diagnose MRSA infection nor to guide or monitor treatment for MRSA infections.   . Lactic Acid, Venous 05/29/2016 2.0* 0.5 - 1.9 mmol/L Final   Comment: CRITICAL RESULT CALLED TO, READ BACK BY AND VERIFIED WITH: S.ADAMS RN @ [phone] 05/29/16 BY C.EDENS   . Magnesium 05/29/2016 1.5* 1.7 - 2.4 mg/dL Final  . WBC 05/30/2016 10.5  4.0 -  10.5 K/uL Final  . RBC 05/30/2016 3.32* 4.22 - 5.81 MIL/uL Final  . Hemoglobin 05/30/2016 9.6* 13.0 - 17.0 g/dL Final  . HCT 05/30/2016 31.3* 39.0 - 52.0 % Final  . MCV 05/30/2016 94.3  78.0 - 100.0 fL Final  . MCH 05/30/2016 28.9  26.0 - 34.0 pg Final  . MCHC 05/30/2016 30.7  30.0 - 36.0 g/dL Final  . RDW 05/30/2016 14.6  11.5 - 15.5 % Final  . Platelets 05/30/2016 207  150 - 400 K/uL Final  . Sodium 05/30/2016 155* 135 - 145 mmol/L Final  . Potassium 05/30/2016 3.6  3.5 - 5.1 mmol/L Final  . Chloride 05/30/2016 119* 101 - 111 mmol/L Final  . CO2 05/30/2016 25  22 - 32 mmol/L Final  . Glucose, Bld 05/30/2016 114* 65 - 99 mg/dL Final  . BUN 05/30/2016 17  6 - 20 mg/dL Final  . Creatinine, Ser 05/30/2016 0.95  0.61 - 1.24 mg/dL Final  . Calcium 05/30/2016 8.3* 8.9 - 10.3 mg/dL Final  . GFR calc non Af Amer 05/30/2016 >60  >60 mL/min Final  . GFR calc Af Amer 05/30/2016 >60  >60 mL/min Final   Comment: (NOTE) The eGFR has been calculated using the CKD EPI equation. This calculation has not been validated in all clinical situations. eGFR's persistently  <60 mL/min signify possible Chronic Kidney Disease.   . Anion gap 05/30/2016 11  5 - 15 Final  . Magnesium 05/30/2016 2.5* 1.7 - 2.4 mg/dL Final  . WBC 05/31/2016 9.6  4.0 - 10.5 K/uL Final  . RBC 05/31/2016 3.43* 4.22 - 5.81 MIL/uL Final  . Hemoglobin 05/31/2016 10.0* 13.0 - 17.0 g/dL Final  . HCT 05/31/2016 32.2* 39.0 - 52.0 % Final  . MCV 05/31/2016 93.9  78.0 - 100.0 fL Final  . MCH 05/31/2016 29.2  26.0 - 34.0 pg Final  . MCHC 05/31/2016 31.1  30.0 - 36.0 g/dL Final  . RDW 05/31/2016 14.4  11.5 - 15.5 % Final  . Platelets 05/31/2016 194  150 - 400 K/uL Final  . Sodium 05/31/2016 152* 135 - 145 mmol/L Final  . Potassium 05/31/2016 2.9* 3.5 - 5.1 mmol/L Final  . Chloride 05/31/2016 117* 101 - 111 mmol/L Final  . CO2 05/31/2016 25  22 - 32 mmol/L Final  . Glucose, Bld 05/31/2016 201* 65 - 99 mg/dL Final  . BUN 05/31/2016 15  6 - 20 mg/dL Final  . Creatinine, Ser 05/31/2016 0.82  0.61 - 1.24 mg/dL Final  . Calcium 05/31/2016 8.6* 8.9 - 10.3 mg/dL Final  . GFR calc non Af Amer 05/31/2016 >60  >60 mL/min Final  . GFR calc Af Amer 05/31/2016 >60  >60 mL/min Final   Comment: (NOTE) The eGFR has been calculated using the CKD EPI equation. This calculation has not been validated in all clinical situations. eGFR's persistently <60 mL/min signify possible Chronic Kidney Disease.   . Anion gap 05/31/2016 10  5 - 15 Final  . WBC 06/01/2016 8.9  4.0 - 10.5 K/uL Final  . RBC 06/01/2016 3.66* 4.22 - 5.81 MIL/uL Final  . Hemoglobin 06/01/2016 10.6* 13.0 - 17.0 g/dL Final  . HCT 06/01/2016 35.0* 39.0 - 52.0 % Final  . MCV 06/01/2016 95.6  78.0 - 100.0 fL Final  . MCH 06/01/2016 29.0  26.0 - 34.0 pg Final  . MCHC 06/01/2016 30.3  30.0 - 36.0 g/dL Final  . RDW 06/01/2016 14.9  11.5 - 15.5 % Final  . Platelets 06/01/2016 217  150 - 400  K/uL Final  . Sodium 06/01/2016 153* 135 - 145 mmol/L Final  . Potassium 06/01/2016 4.2  3.5 - 5.1 mmol/L Final  . Chloride 06/01/2016 119* 101 - 111 mmol/L  Final  . CO2 06/01/2016 25  22 - 32 mmol/L Final  . Glucose, Bld 06/01/2016 211* 65 - 99 mg/dL Final  . BUN 06/01/2016 13  6 - 20 mg/dL Final  . Creatinine, Ser 06/01/2016 0.81  0.61 - 1.24 mg/dL Final  . Calcium 06/01/2016 8.8* 8.9 - 10.3 mg/dL Final  . GFR calc non Af Amer 06/01/2016 >60  >60 mL/min Final  . GFR calc Af Amer 06/01/2016 >60  >60 mL/min Final   Comment: (NOTE) The eGFR has been calculated using the CKD EPI equation. This calculation has not been validated in all clinical situations. eGFR's persistently <60 mL/min signify possible Chronic Kidney Disease.   . Anion gap 06/01/2016 9  5 - 15 Final  . Magnesium 06/01/2016 2.0  1.7 - 2.4 mg/dL Final  Office Visit on 04/05/2016  Component Date Value Ref Range Status  . Vitamin B-12 04/05/2016 1462* 232 - 1,245 pg/mL Final  . Folate 04/05/2016 >20.0  >3.0 ng/mL Final   Comment: A serum folate concentration of less than 3.1 ng/mL is considered to represent clinical deficiency.   . RPR Ser Ql 04/05/2016 Non Reactive  Non Reactive Final    Dg Chest 2 View  Result Date: 06/08/2016 CLINICAL DATA:  Hyperglycemia.  Dementia. EXAM: CHEST  2 VIEW COMPARISON:  Single-view of the chest 05/28/2016 and 07/04/2015. FINDINGS: Lungs are clear. Heart size is normal. No pneumothorax or pleural effusion. No acute bony abnormality. IMPRESSION: No acute disease. Electronically Signed   By: Inge Rise M.D.   On: 06/08/2016 14:33   Ct Head Wo Contrast  Result Date: 05/29/2016 CLINICAL DATA:  Acute encephalopathy. History of seizures, hypertension, diabetes, dementia, and Alzheimer's disease. EXAM: CT HEAD WITHOUT CONTRAST TECHNIQUE: Contiguous axial images were obtained from the base of the skull through the vertex without intravenous contrast. COMPARISON:  MRI brain 07/02/2015.  CT head 07/01/2015. FINDINGS: Brain: Diffuse cerebral atrophy. Ventricular dilatation consistent with central atrophy. Patchy low-attenuation changes in the deep  white matter consistent with small vessel ischemia. Prominent calcification of the falx. No mass effect or midline shift. No abnormal extra-axial fluid collections. Gray-white matter junctions are distinct. Basal cisterns are not effaced. No acute intracranial hemorrhage. Vascular: Vascular calcifications are present peer Skull: Normal. Negative for fracture or focal lesion. Sinuses/Orbits: No acute finding. Other: No significant changes since previous study. IMPRESSION: No acute intracranial abnormalities. Mild chronic atrophy and small vessel ischemic changes. Electronically Signed   By: Lucienne Capers M.D.   On: 05/29/2016 00:37   Dg Chest Port 1 View  Result Date: 05/28/2016 CLINICAL DATA:  High fever and altered mental status. Patient was uncooperative. EXAM: PORTABLE CHEST 1 VIEW COMPARISON:  05/28/2016 at 1801 hours FINDINGS: Shallow inspiration. Heart size and pulmonary vascularity are normal. No focal airspace disease or consolidation in the lungs. No pneumothorax. Calcified and tortuous aorta. IMPRESSION: No evidence of active pulmonary disease. Electronically Signed   By: Lucienne Capers M.D.   On: 05/28/2016 23:23   Dg Chest Port 1 View  Result Date: 05/28/2016 CLINICAL DATA:  Acute mental status change.  Fever. EXAM: PORTABLE CHEST 1 VIEW COMPARISON:  July 04, 2015 FINDINGS: The study is markedly limited due to positioning. The patient was unable/unwilling to cooperate. Within this limitation, no pneumothorax. The right lateral lower lung was not imaged.  Within visualize limits, no pneumonia identified. The cardiomediastinal silhouette is unchanged given difference in positioning. IMPRESSION: Markedly limited study as above.  No acute abnormalities are seen. Electronically Signed   By: Dorise Bullion III M.D   On: 05/28/2016 18:15     Assessment/Plan   ICD-9-CM ICD-10-CM   1. Type 2 diabetes mellitus with hyperglycemia, with long-term current use of insulin (HCC) 250.00 E11.65     790.29 Z79.4    V58.67    2. FTT (failure to thrive) in adult 783.7 R62.7   3. Physical deconditioning 799.3 R53.81   4. Alzheimer's dementia with behavioral disturbance, unspecified timing of dementia onset 331.0 G30.8    294.11 F02.81   5. Seizures (HCC) 780.39 R56.9   6. Essential hypertension 401.9 I10   7. Mixed hyperlipidemia 272.2 E78.2   8. H/O: CVA (cerebrovascular accident) V12.54 Z86.73   9. Depression, unspecified depression type 311 F32.9     Check BMP  Follow CBGs  Fall precautions  Cont nutritional supplements per protocol  Cont current meds as ordered  PT/OT/ST as ordered  GOAL: short term rehab and d/c home when medically appropriate. Communicated with pt and nursing.  Will follow  Mykel Sponaugle S. Perlie Gold  [facility] [address] [place], [place] [address] [phone] Cell (Monday-Friday 8 AM - 5 PM) [phone] After 5 PM and follow prompts

## 2016-07-12 ENCOUNTER — Non-Acute Institutional Stay (SKILLED_NURSING_FACILITY): Payer: Medicaid Other | Admitting: Internal Medicine

## 2016-07-12 DIAGNOSIS — I10 Essential (primary) hypertension: Secondary | ICD-10-CM

## 2016-07-12 DIAGNOSIS — R569 Unspecified convulsions: Secondary | ICD-10-CM

## 2016-07-12 DIAGNOSIS — E1165 Type 2 diabetes mellitus with hyperglycemia: Secondary | ICD-10-CM

## 2016-07-12 DIAGNOSIS — E1149 Type 2 diabetes mellitus with other diabetic neurological complication: Secondary | ICD-10-CM | POA: Diagnosis not present

## 2016-07-12 DIAGNOSIS — F0280 Dementia in other diseases classified elsewhere without behavioral disturbance: Secondary | ICD-10-CM

## 2016-07-12 DIAGNOSIS — G301 Alzheimer's disease with late onset: Secondary | ICD-10-CM | POA: Diagnosis not present

## 2016-07-12 DIAGNOSIS — IMO0002 Reserved for concepts with insufficient information to code with codable children: Secondary | ICD-10-CM

## 2016-07-12 DIAGNOSIS — N179 Acute kidney failure, unspecified: Secondary | ICD-10-CM | POA: Diagnosis not present

## 2016-07-12 NOTE — Progress Notes (Signed)
This is a routine visit.  Level care skilled.  Facility is [facility]  Chief complaint medical management of chronic medical issues including dementia-diabetes-acute kidney injury-depression-history CVA-hyperlipidemia-hypertension-.  History of present illness.  Patient is a 82 year old male with the above diagnoses he was hospitalized last month for hyperglycemia secondary to uncontrolled diabetes.  Blood sugar in the facility was found to be 600 and he was sent to the ER.  With treatment in hospital blood sugar did go down to the 267 level.   His sodium also was elevated at 153 this was thought secondary to poor oral intake in the setting of severe hyperglycemia  .  His mental status improved with correction.  He also had elevated lactic acid this was thought secondary to renal insufficiency and improved with IV fluids.  Regards to diabetes-he was started on insulin Lantus 5 units and sliding scale.  Blood sugars appear to be variable this morning was 237 and last  night was 245.  History morning was 349.  It appears generally blood sugars run from the mid 100s to occasionally above 300 although readings above 300 are fairly rare average appears to be more in the 200s range will update a hemoglobin A1c to see where we stand.  Regards to hypertension recent blood pressures 141/82-118/78 is not currently on blood pressure medication.  In regards to dementia he continues on Namenda 10 mg twice a day he appears to be doing relatively well with supportive care is also on Celexa for coexistent depression  Regards to CVA he continues on aspirin.  Of note his weight appears to be stable at 115 pounds     Past Medical History:  Diagnosis Date  . Acute encephalopathy   . Alzheimer disease   . Dehydration   . Dementia   . Depression   . Diabetes mellitus without complication (HCC)   . Elevated troponin   . Hypertension   . PNA (pneumonia)   . Pressure ulcer    . Seizures (HCC)     Past Surgical History:  Procedure Laterality Date  . NO PAST SURGERIES      Patient Care Team: Kirt BoysMonica Carter, DO as PCP - General (Internal Medicine)  Social History        Social History  . Marital status: Single    Spouse name: N/A  . Number of children: 0  . Years of education: 4       Occupational History  . retired         Social History Main Topics  . Smoking status: Former Smoker    Quit date: 07/07/2010  . Smokeless tobacco: Never Used  . Alcohol use No  . Drug use: No  . Sexual activity: Not on file       Other Topics Concern  . Not on file      Social History Narrative   Lives with sister and brother   Caffeine use: Tea    Very little coffee   Soda sometimes     reports that he quit smoking about 5 years ago. He has never used smokeless tobacco. He reports that he does not drink alcohol or use drugs.       Family History  Problem Relation Age of Onset  . Dementia Mother   . Colon cancer Father   . Diabetes Mellitus II Brother        Family Status  Relation Status  . Mother Deceased at age 80  . Father Deceased  .  Sister Alive  . Brother         Immunization History  Administered Date(s) Administered  . Influenza,inj,Quad PF,36+ Mos 07/07/2015  . PPD Test 07/10/2015, 06/04/2016    No Known Allergies  Medications:     Patient's Medications  New Prescriptions   No medications on file  Previous Medications   AMINO ACIDS (HIGH PROTEIN PO)    Take 30 mLs by mouth 2 (two) times daily.    ASPIRIN 325 MG TABLET    Take 1 tablet (325 mg total) by mouth daily.   ATORVASTATIN (LIPITOR) 20 MG TABLET    Take 20 mg by mouth daily.   CHOLECALCIFEROL (VITAMIN D) 1000 UNITS TABLET    Take 1,000 Units by mouth daily.   CITALOPRAM (CELEXA) 10 MG TABLET    Take 10 mg by mouth at bedtime.    FOLIC ACID (FOLVITE) 1 MG TABLET    Take 1 tablet (1 mg total) by mouth daily.   INSULIN  GLARGINE (LANTUS) 100 UNIT/ML INJECTION    Inject 0.05 mLs (5 Units total) into the skin at bedtime.   MEMANTINE (NAMENDA) 10 MG TABLET    Take 10 mg by mouth 2 (two) times daily.    MULTIPLE VITAMIN (MULTIVITAMIN WITH MINERALS) TABS TABLET    Take 1 tablet by mouth daily.   NUTRITIONAL SUPPLEMENTS (NUTRITIONAL SUPPLEMENT PLUS) LIQD    Take 1 Bottle by mouth 2 (two) times daily. Med Pass 120 mL   THIAMINE 100 MG TABLET    Take 1 tablet (100 mg total) by mouth daily.  Modified Medications   No medications on file  Discontinued Medications   No medications on file    Labs.  06/11/2016.  Sodium 140 potassium 4.4 BUN 26 creatinine 0.89  06/08/2016.  Hemoglobin A1c was 8.7   .  white count ws 11.4 hgb 12. Platelets 372  Liver function tests 07/06/2016  showed an albumin of 3. AlLT-119 othrwise liver function tets within normal limits  Review of systems.  Essentially unattainable secondary to dementia please see history of present illness.  Physical exam.  Temperature is 97.8 pulse 92 respirations 20 blood pressure 141/82-118/78.  Weight appears stable at 115 pounds actually appears to be slowly gaining  In general this is a frail appearing elderly male in no distress sitting comfortably in his wheelchair.  His skin is warm and dry.  Eyes sclera and conjunctiva appear to be relatively clear visual acuity grossly intact.  Oropharynx is clear mucous membranes moist she has numerous extractions.  Heart is regular rate and rhythm with a minimal 1 / 6 systolic murmur this is barely audible he does not have significant lower extremity edema chest is clear to auscultation there is no labored breathing  Abdomen is soft nontender with positive bowel sounds.  Musculoskeletal is able to move all extremities 4 with some general frailty all extremities his upper extremities and somewhat of a contracted position but is easily movable with gentle range of motion.  Neurologic is  grossly intact he is alert he does follow simple verbal commands does not really make eye contact however.  Psych findings compatible with significant dementia he does follow simple verbal commands       assessment and plan.  #1 diabetes type 2 currently on Lantus 5 units a day as well as sliding scale-CBGs show some variability will update a hemoglobin A1c to assess long-term control here.  #2 history hypertension this appears diet-controlled- And relatively stable at this point we  will monitor I do not see consistent systolic elevations  #3 history of hyperlipidemia he does continue on Lipitor-will update lipid panel.  #4 history of dementia with failure to thrive his weight appears to be stable  last albumin was 3.3-he takes Namenda as well as nutritional supplements including folate and thiamine.  #5 history question seizure disorder recent EEG didn't show any epileptic changes.  #6 history of depression he is on Celexa.  #7 history of acute kidney injury apparently this resolved with hydration will update a metabolic panel  Of note Will update a CBC secondary to mildly elevated white count in hospital-as well as a metabolic panel to update liver function tests and renal function.  Also will update a hemoglobin A1c as noted above.    [id number] note greater than 40 minutes spent assessing patient-reviewing his chart-reviewing his labs-and coordinating and formulating a plan of care for numerous diagnoses-note greater than 50% of time spent coordinating plan of care-

## 2016-07-13 LAB — CBC AND DIFFERENTIAL
HEMATOCRIT: 32 % — AB (ref 41–53)
HEMOGLOBIN: 10.1 g/dL — AB (ref 13.5–17.5)
NEUTROS ABS: 4 /uL
Platelets: 219 10*3/uL (ref 150–399)
WBC: 7.8 10^3/mL

## 2016-07-19 ENCOUNTER — Encounter: Payer: Self-pay | Admitting: Adult Health

## 2016-07-19 NOTE — Progress Notes (Signed)
ENTERED IN ERROR

## 2016-08-11 ENCOUNTER — Other Ambulatory Visit: Payer: Self-pay | Admitting: Adult Health

## 2016-08-16 ENCOUNTER — Other Ambulatory Visit: Payer: Self-pay | Admitting: Adult Health

## 2016-09-03 ENCOUNTER — Other Ambulatory Visit: Payer: Self-pay | Admitting: Adult Health

## 2016-11-06 ENCOUNTER — Other Ambulatory Visit: Payer: Self-pay | Admitting: Adult Health

## 2017-02-01 ENCOUNTER — Encounter (HOSPITAL_COMMUNITY): Payer: Self-pay | Admitting: *Deleted

## 2017-02-01 ENCOUNTER — Emergency Department (HOSPITAL_COMMUNITY): Payer: Medicare Other

## 2017-02-01 ENCOUNTER — Inpatient Hospital Stay (HOSPITAL_COMMUNITY)
Admission: EM | Admit: 2017-02-01 | Discharge: 2017-02-11 | DRG: 871 | Disposition: A | Discharge status: Skilled Nursing Facility | Payer: Medicare Other | Attending: Internal Medicine | Admitting: Internal Medicine

## 2017-02-01 DIAGNOSIS — Z66 Do not resuscitate: Secondary | ICD-10-CM | POA: Diagnosis present

## 2017-02-01 DIAGNOSIS — E876 Hypokalemia: Secondary | ICD-10-CM | POA: Diagnosis not present

## 2017-02-01 DIAGNOSIS — Z7984 Long term (current) use of oral hypoglycemic drugs: Secondary | ICD-10-CM

## 2017-02-01 DIAGNOSIS — Z681 Body mass index (BMI) 19 or less, adult: Secondary | ICD-10-CM

## 2017-02-01 DIAGNOSIS — R739 Hyperglycemia, unspecified: Secondary | ICD-10-CM

## 2017-02-01 DIAGNOSIS — I959 Hypotension, unspecified: Secondary | ICD-10-CM | POA: Diagnosis not present

## 2017-02-01 DIAGNOSIS — I10 Essential (primary) hypertension: Secondary | ICD-10-CM | POA: Diagnosis not present

## 2017-02-01 DIAGNOSIS — E861 Hypovolemia: Secondary | ICD-10-CM | POA: Diagnosis present

## 2017-02-01 DIAGNOSIS — Z23 Encounter for immunization: Secondary | ICD-10-CM | POA: Diagnosis present

## 2017-02-01 DIAGNOSIS — F0280 Dementia in other diseases classified elsewhere without behavioral disturbance: Secondary | ICD-10-CM | POA: Diagnosis present

## 2017-02-01 DIAGNOSIS — Z87891 Personal history of nicotine dependence: Secondary | ICD-10-CM

## 2017-02-01 DIAGNOSIS — A040 Enteropathogenic Escherichia coli infection: Secondary | ICD-10-CM | POA: Diagnosis not present

## 2017-02-01 DIAGNOSIS — R6520 Severe sepsis without septic shock: Secondary | ICD-10-CM | POA: Diagnosis not present

## 2017-02-01 DIAGNOSIS — A419 Sepsis, unspecified organism: Secondary | ICD-10-CM | POA: Diagnosis not present

## 2017-02-01 DIAGNOSIS — E162 Hypoglycemia, unspecified: Secondary | ICD-10-CM | POA: Diagnosis not present

## 2017-02-01 DIAGNOSIS — Z8673 Personal history of transient ischemic attack (TIA), and cerebral infarction without residual deficits: Secondary | ICD-10-CM

## 2017-02-01 DIAGNOSIS — E43 Unspecified severe protein-calorie malnutrition: Secondary | ICD-10-CM | POA: Diagnosis present

## 2017-02-01 DIAGNOSIS — N390 Urinary tract infection, site not specified: Secondary | ICD-10-CM | POA: Diagnosis present

## 2017-02-01 DIAGNOSIS — G301 Alzheimer's disease with late onset: Secondary | ICD-10-CM | POA: Diagnosis not present

## 2017-02-01 DIAGNOSIS — R64 Cachexia: Secondary | ICD-10-CM | POA: Diagnosis present

## 2017-02-01 DIAGNOSIS — Z7189 Other specified counseling: Secondary | ICD-10-CM | POA: Diagnosis not present

## 2017-02-01 DIAGNOSIS — R6521 Severe sepsis with septic shock: Secondary | ICD-10-CM | POA: Diagnosis present

## 2017-02-01 DIAGNOSIS — Z7982 Long term (current) use of aspirin: Secondary | ICD-10-CM

## 2017-02-01 DIAGNOSIS — E1110 Type 2 diabetes mellitus with ketoacidosis without coma: Secondary | ICD-10-CM | POA: Diagnosis present

## 2017-02-01 DIAGNOSIS — E870 Hyperosmolality and hypernatremia: Secondary | ICD-10-CM | POA: Diagnosis present

## 2017-02-01 DIAGNOSIS — R569 Unspecified convulsions: Secondary | ICD-10-CM

## 2017-02-01 DIAGNOSIS — L89151 Pressure ulcer of sacral region, stage 1: Secondary | ICD-10-CM | POA: Diagnosis not present

## 2017-02-01 DIAGNOSIS — L8990 Pressure ulcer of unspecified site, unspecified stage: Secondary | ICD-10-CM | POA: Insufficient documentation

## 2017-02-01 DIAGNOSIS — E860 Dehydration: Secondary | ICD-10-CM | POA: Diagnosis present

## 2017-02-01 DIAGNOSIS — R319 Hematuria, unspecified: Secondary | ICD-10-CM | POA: Diagnosis present

## 2017-02-01 DIAGNOSIS — G309 Alzheimer's disease, unspecified: Secondary | ICD-10-CM | POA: Diagnosis not present

## 2017-02-01 DIAGNOSIS — N179 Acute kidney failure, unspecified: Secondary | ICD-10-CM | POA: Diagnosis present

## 2017-02-01 DIAGNOSIS — Z79899 Other long term (current) drug therapy: Secondary | ICD-10-CM

## 2017-02-01 DIAGNOSIS — Z515 Encounter for palliative care: Secondary | ICD-10-CM

## 2017-02-01 DIAGNOSIS — B377 Candidal sepsis: Secondary | ICD-10-CM | POA: Diagnosis present

## 2017-02-01 DIAGNOSIS — N3000 Acute cystitis without hematuria: Secondary | ICD-10-CM | POA: Diagnosis not present

## 2017-02-01 DIAGNOSIS — Z9181 History of falling: Secondary | ICD-10-CM

## 2017-02-01 LAB — CBC WITH DIFFERENTIAL/PLATELET
BAND NEUTROPHILS: 0 %
BASOS ABS: 0 10*3/uL (ref 0.0–0.1)
BLASTS: 0 %
Basophils Relative: 0 %
EOS PCT: 0 %
Eosinophils Absolute: 0 10*3/uL (ref 0.0–0.7)
HEMATOCRIT: 26.6 % — AB (ref 39.0–52.0)
Hemoglobin: 8.2 g/dL — ABNORMAL LOW (ref 13.0–17.0)
LYMPHS ABS: 2.5 10*3/uL (ref 0.7–4.0)
LYMPHS PCT: 9 %
MCH: 26.6 pg (ref 26.0–34.0)
MCHC: 30.8 g/dL (ref 30.0–36.0)
MCV: 86.4 fL (ref 78.0–100.0)
METAMYELOCYTES PCT: 0 %
MONOS PCT: 1 %
Monocytes Absolute: 0.3 10*3/uL (ref 0.1–1.0)
Myelocytes: 0 %
NEUTROS ABS: 24.5 10*3/uL — AB (ref 1.7–7.7)
Neutrophils Relative %: 90 %
OTHER: 0 %
PLATELETS: 613 10*3/uL — AB (ref 150–400)
Promyelocytes Absolute: 0 %
RBC: 3.08 MIL/uL — AB (ref 4.22–5.81)
RDW: 15.9 % — AB (ref 11.5–15.5)
WBC: 27.3 10*3/uL — AB (ref 4.0–10.5)
nRBC: 0 /100 WBC

## 2017-02-01 LAB — URINALYSIS, ROUTINE W REFLEX MICROSCOPIC
Bilirubin Urine: NEGATIVE
GLUCOSE, UA: NEGATIVE mg/dL
KETONES UR: NEGATIVE mg/dL
Nitrite: NEGATIVE
PROTEIN: 100 mg/dL — AB
SQUAMOUS EPITHELIAL / LPF: NONE SEEN
Specific Gravity, Urine: 1.013 (ref 1.005–1.030)
pH: 5 (ref 5.0–8.0)

## 2017-02-01 LAB — COMPREHENSIVE METABOLIC PANEL
ALBUMIN: 2.1 g/dL — AB (ref 3.5–5.0)
ALK PHOS: 81 U/L (ref 38–126)
ALT: 19 U/L (ref 17–63)
AST: 17 U/L (ref 15–41)
Anion gap: 11 (ref 5–15)
BUN: 142 mg/dL — ABNORMAL HIGH (ref 6–20)
CALCIUM: 10.9 mg/dL — AB (ref 8.9–10.3)
CHLORIDE: 114 mmol/L — AB (ref 101–111)
CO2: 11 mmol/L — AB (ref 22–32)
CREATININE: 3.68 mg/dL — AB (ref 0.61–1.24)
GFR calc Af Amer: 17 mL/min — ABNORMAL LOW (ref >60)
GFR calc non Af Amer: 14 mL/min — ABNORMAL LOW (ref >60)
GLUCOSE: 566 mg/dL — AB (ref 65–99)
Potassium: 6.9 mmol/L (ref 3.5–5.1)
SODIUM: 136 mmol/L (ref 135–145)
Total Bilirubin: 0.3 mg/dL (ref 0.3–1.2)
Total Protein: 9 g/dL — ABNORMAL HIGH (ref 6.5–8.1)

## 2017-02-01 LAB — CBG MONITORING, ED
GLUCOSE-CAPILLARY: 508 mg/dL — AB (ref 65–99)
GLUCOSE-CAPILLARY: 482 mg/dL — AB (ref 65–99)
GLUCOSE-CAPILLARY: 449 mg/dL — AB (ref 65–99)
GLUCOSE-CAPILLARY: 362 mg/dL — AB (ref 65–99)

## 2017-02-01 LAB — I-STAT CG4 LACTIC ACID, ED
Lactic Acid, Venous: 3.18 mmol/L (ref 0.5–1.9)
Lactic Acid, Venous: 4.05 mmol/L (ref 0.5–1.9)

## 2017-02-01 LAB — I-STAT CHEM 8, ED
BUN: 115 mg/dL — ABNORMAL HIGH (ref 6–20)
CREATININE: 3 mg/dL — AB (ref 0.61–1.24)
Calcium, Ion: 1.51 mmol/L (ref 1.15–1.40)
Chloride: 123 mmol/L — ABNORMAL HIGH (ref 101–111)
Glucose, Bld: 478 mg/dL — ABNORMAL HIGH (ref 65–99)
HEMATOCRIT: 29 % — AB (ref 39.0–52.0)
HEMOGLOBIN: 9.9 g/dL — AB (ref 13.0–17.0)
POTASSIUM: 6.2 mmol/L — AB (ref 3.5–5.1)
SODIUM: 146 mmol/L — AB (ref 135–145)
TCO2: 13 mmol/L — AB (ref 22–32)

## 2017-02-01 LAB — GLUCOSE, CAPILLARY: Glucose-Capillary: 284 mg/dL — ABNORMAL HIGH (ref 65–99)

## 2017-02-01 LAB — I-STAT TROPONIN, ED: TROPONIN I, POC: 0 ng/mL (ref 0.00–0.08)

## 2017-02-01 LAB — PROTIME-INR
INR: 1.27
Prothrombin Time: 15.8 seconds — ABNORMAL HIGH (ref 11.4–15.2)

## 2017-02-01 MED ORDER — DEXTROSE-NACL 5-0.45 % IV SOLN: INTRAVENOUS | Status: DC

## 2017-02-01 MED ORDER — SODIUM CHLORIDE 0.9 % IV SOLN
INTRAVENOUS | Status: DC
  Administered 2017-02-02: via INTRAVENOUS

## 2017-02-01 MED ORDER — SODIUM CHLORIDE 0.9 % IV SOLN
INTRAVENOUS | Status: DC
  Administered 2017-02-01: 4.2 [IU]/h via INTRAVENOUS
  Filled 2017-02-01: qty 1

## 2017-02-01 MED ORDER — SODIUM CHLORIDE 0.9 % IV BOLUS (SEPSIS)
1000.0000 mL | Freq: Once | INTRAVENOUS | Status: AC
  Administered 2017-02-01: 1000 mL via INTRAVENOUS

## 2017-02-01 MED ORDER — SODIUM CHLORIDE 0.9 % IV SOLN
INTRAVENOUS | Status: DC
  Administered 2017-02-02: 2.9 [IU]/h via INTRAVENOUS

## 2017-02-01 MED ORDER — SODIUM CHLORIDE 0.9 % IV BOLUS (SEPSIS)
250.0000 mL | Freq: Once | INTRAVENOUS | Status: AC
  Administered 2017-02-01: 250 mL via INTRAVENOUS

## 2017-02-01 MED ORDER — VANCOMYCIN HCL IN DEXTROSE 1-5 GM/200ML-% IV SOLN
1000.0000 mg | Freq: Once | INTRAVENOUS | Status: AC
Start: 2017-02-02 — End: 2017-02-02
  Administered 2017-02-02: 1000 mg via INTRAVENOUS
  Filled 2017-02-01: qty 200

## 2017-02-01 MED ORDER — DEXTROSE 5 % IV SOLN
2.0000 g | Freq: Once | INTRAVENOUS | Status: AC
  Administered 2017-02-01: 2 g via INTRAVENOUS
  Filled 2017-02-01: qty 2

## 2017-02-01 MED ORDER — SODIUM CHLORIDE 0.9 % IV SOLN
1.0000 g | Freq: Once | INTRAVENOUS | Status: AC
  Administered 2017-02-01: 1 g via INTRAVENOUS
  Filled 2017-02-01: qty 10

## 2017-02-01 MED ORDER — ENOXAPARIN SODIUM 30 MG/0.3ML ~~LOC~~ SOLN
30.0000 mg | Freq: Every day | SUBCUTANEOUS | Status: DC
  Administered 2017-02-02 – 2017-02-05 (×4): 30 mg via SUBCUTANEOUS
  Filled 2017-02-01 (×4): qty 0.3

## 2017-02-01 MED ORDER — PIPERACILLIN-TAZOBACTAM IN DEX 2-0.25 GM/50ML IV SOLN
2.2500 g | Freq: Three times a day (TID) | INTRAVENOUS | Status: DC
  Administered 2017-02-02 (×2): 2.25 g via INTRAVENOUS
  Filled 2017-02-01 (×3): qty 50

## 2017-02-01 MED ORDER — SODIUM CHLORIDE 0.9 % IV BOLUS (SEPSIS): 1000.0000 mL | Freq: Once | INTRAVENOUS | Status: DC

## 2017-02-01 MED ORDER — SODIUM CHLORIDE 0.9 % IV BOLUS (SEPSIS)
500.0000 mL | Freq: Once | INTRAVENOUS | Status: AC
  Administered 2017-02-01: 500 mL via INTRAVENOUS

## 2017-02-01 MED ORDER — SODIUM CHLORIDE 0.9 % IV SOLN
INTRAVENOUS | Status: AC
  Administered 2017-02-02: via INTRAVENOUS

## 2017-02-01 MED ORDER — DEXTROSE-NACL 5-0.45 % IV SOLN
INTRAVENOUS | Status: DC
  Administered 2017-02-02 (×2): via INTRAVENOUS

## 2017-02-01 MED ORDER — VANCOMYCIN HCL 500 MG IV SOLR: 500.0000 mg | INTRAVENOUS | Status: DC

## 2017-02-01 MED ORDER — DEXTROSE 5 % IV SOLN: 1.0000 g | INTRAVENOUS | Status: DC

## 2017-02-01 NOTE — Progress Notes (Signed)
Pharmacy Antibiotic Note  Eric Cameron is a 82 y.o. male admitted on 02/01/2017 with sepsis.  Pharmacy has been consulted for vancomycin and zosyn dosing.  Temp normal, WBC elevated at 27.3, and LA elevated at 4.05. SCr 3 for estimated CrCl ~ 10-20 mL/min.   Plan: Vancomycin 1g IV x1, then 500mg  IV q48hr  Zosyn 2.25g IV q8hr  [facility] and as needed (goal 15-20 mcg/mL) Monitor renal function, clinical picture, and culture data F/u length of therapy and de-escalation   Temp (24hrs), Avg:98.7 F (37.1 C), Min:98.3 F (36.8 C), Max:99.1 F (37.3 C)   Recent Labs Lab 02/01/17 1640 02/01/17 1649 02/01/17 2053 02/01/17 2054  WBC 27.3*  --   --   --   CREATININE 3.68*  --  3.00*  --   LATICACIDVEN  --  3.18*  --  4.05*    CrCl cannot be calculated (Unknown ideal weight.).    No Known Allergies  Antimicrobials this admission: 10/17 CTX x1 10/17 Zosyn >>  10/17 Vanc >>   Microbiology results: pending   Einar CrowKatherine Franciscojavier Wronski, PharmD Clinical Pharmacist 02/01/17 11:36 PM

## 2017-02-01 NOTE — ED Notes (Signed)
Called phlebotomy for assistance with blood draws requested by MD.  Unable to obtain.  All IVs currently in use.

## 2017-02-01 NOTE — ED Triage Notes (Signed)
Family reports pt is has hx of dementia and alzheimers. Has been sleeping more over past few days, normally is able to assist with ambulation and ADLs but since yesterday has become total care and not able to help with transfers and ambulation. Pt feels warm at triage, is hypotensive and altered. Is arousable with stimuli but not answering questions at triage.

## 2017-02-01 NOTE — ED Provider Notes (Addendum)
[name] EMERGENCY DEPARTMENT Provider Note   CSN: [id number] Arrival date & time: 02/01/17  1600     History   Chief Complaint Chief Complaint  Patient presents with  . Weakness    HPI Eric Cameron is a 82 y.o. male. Chief complaint is altered, and decreased level of consciousness  HPI: -year-old male. Lives at home. His care is given by his elderly 2 sisters with whom he lives. His sister accompanies him here and provides his history.  Normally he does get up and out of bed and walks. Normally feeds himself with his sisters help. He Is confused on a regular basis but does communicate.  He had a considerable change "a few days ago". Sr. States it was 2 or 3 days. Became less active. He had less desire to eat. He has drank a little. He did eat some yogurt this morning. He would not get out of bed today. Sister states is because he was weak. She did not feel those is because he simply did not want to.  She's not noticed cough, fever, or vomiting. She does not know if he has urinated or had a bowel movement today. Has not recognized any diarrhea.  He has a history of diabetes. Is not currently on insulin although he "used to be". He is on "a pill"  Past Medical History:  Diagnosis Date  . Acute encephalopathy   . Alzheimer disease   . Dehydration   . Dementia   . Depression   . Diabetes mellitus without complication (HCC)   . Elevated troponin   . Hypertension   . PNA (pneumonia)   . Pressure ulcer   . Seizures [facility])     Patient Active Problem List   Diagnosis Date Noted  . Acute kidney injury (HCC) 06/08/2016  . Elevated lactic acid level 06/08/2016  . Sepsis (HCC) 05/28/2016  . UTI (urinary tract infection) 05/28/2016  . Alzheimer's dementia without behavioral disturbance 04/10/2016  . Fungemia 07/18/2015  . Leukocytosis 07/18/2015  . Pressure ulcer of hip 07/06/2015  . PNA (pneumonia) 07/06/2015  . Elevated troponin 07/01/2015  .  Dehydration 07/01/2015  . Seizures (HCC) 12/03/2013  . H/O: CVA (cerebrovascular accident) 12/01/2013  . Altered mental status 11/30/2013  . Acute encephalopathy 11/30/2013  . HTN (hypertension) 11/30/2013  . Diabetes mellitus type 2, uncontrolled (HCC) 11/30/2013  . Dementia 11/30/2013  . HLD (hyperlipidemia) 11/30/2013    Past Surgical History:  Procedure Laterality Date  . NO PAST SURGERIES         Home Medications    Prior to Admission medications   Medication Sig Start Date End Date Taking? Authorizing Provider  Amino Acids (HIGH PROTEIN PO) Take 30 mLs by mouth 2 (two) times daily.    Yes [provider]  aspirin 325 MG tablet Take 1 tablet (325 mg total) by mouth daily. 12/03/13  Yes Mikhail, Nita Sells, DO  atorvastatin (LIPITOR) 20 MG tablet Take 20 mg by mouth daily.   Yes [provider]  folic acid (FOLVITE) 1 MG tablet Take 1 tablet (1 mg total) by mouth daily. 07/10/15  Yes Meredeth Ide, MD  lisinopril (PRINIVIL,ZESTRIL) 2.5 MG tablet Take 2.5 mg by mouth daily.   Yes [provider]  memantine (NAMENDA) 10 MG tablet Take 10 mg by mouth 2 (two) times daily.    Yes [provider]  metFORMIN (GLUCOPHAGE-XR) 500 MG 24 hr tablet Take 1,000 mg by mouth daily. 01/31/17  Yes [provider]  Nutritional Supplements (NUTRITIONAL SUPPLEMENT PLUS) LIQD Take 1 Bottle by mouth 2 (two) times daily. Med Pass 120 mL   Yes [provider]  thiamine 100 MG tablet Take 1 tablet (100 mg total) by mouth daily. 07/10/15  Yes Meredeth Ide, MD  vitamin B-12 (CYANOCOBALAMIN) 100 MCG tablet Take 100 mcg by mouth daily.   Yes [provider]  insulin glargine (LANTUS) 100 UNIT/ML injection Inject 0.05 mLs (5 Units total) into the skin at bedtime. Patient not taking: Reported on 02/01/2017 06/11/16   Narda Bonds, MD    Family History Family History  Problem Relation Age of Onset  . Dementia Mother   . Colon cancer Father   .  Diabetes Mellitus II Brother     Social History Social History  Substance Use Topics  . Smoking status: Former Smoker    Quit date: 07/07/2010  . Smokeless tobacco: Never Used  . Alcohol use No     Allergies   Patient has no known allergies.   Review of Systems Review of Systems  Unable to perform ROS: Mental status change     Physical Exam Updated Vital Signs BP (!) 124/56   Pulse (!) 108   Temp 98.3 F (36.8 C) (Rectal)   Resp (!) 25   SpO2 100%   Physical Exam  Constitutional:  Thin and frail-appearing. Curled in a fetal position. Eyes closed. Will open them to voice. Does not speak  HENT:  Conjunctiva not pale. No scleral icterus. Mucous membranes of his mouth are dry  Eyes: Pupils are equal, round, and reactive to light.  Neck: Normal range of motion. No JVD present.  Cardiovascular:  Sinus tachycardia at 105-110. Regular rhythm.  Pulmonary/Chest:  No increased work of breathing. Not tachypneic. Oxygenating high 90s on room air  Abdominal:  Soft abdomen. No mass. No apparent tenderness to exam  Musculoskeletal: He exhibits no edema.  Neurological:  Nonverbal. Moves all 4 extremities.  Skin: Skin is dry.     ED Treatments / Results  Labs (all labs ordered are listed, but only abnormal results are displayed) Labs Reviewed  COMPREHENSIVE METABOLIC PANEL - Abnormal; Notable for the following:       Result Value   Potassium 6.9 (*)    Chloride 114 (*)    CO2 11 (*)    Glucose, Bld 566 (*)    BUN 142 (*)    Creatinine, Ser 3.68 (*)    Calcium 10.9 (*)    Total Protein 9.0 (*)    Albumin 2.1 (*)    GFR calc non Af Amer 14 (*)    GFR calc Af Amer 17 (*)    All other components within normal limits  CBC WITH DIFFERENTIAL/PLATELET - Abnormal; Notable for the following:    WBC 27.3 (*)    RBC 3.08 (*)    Hemoglobin 8.2 (*)    HCT 26.6 (*)    RDW 15.9 (*)    Platelets 613 (*)    Neutro Abs 24.5 (*)    All other components within normal limits    PROTIME-INR - Abnormal; Notable for the following:    Prothrombin Time 15.8 (*)    All other components within normal limits  URINALYSIS, ROUTINE W REFLEX MICROSCOPIC - Abnormal; Notable for the following:    APPearance TURBID (*)    Hgb urine dipstick MODERATE (*)    Protein, ur 100 (*)    Leukocytes, UA MODERATE (*)    Bacteria, UA FEW (*)  All other components within normal limits  I-STAT CG4 LACTIC ACID, ED - Abnormal; Notable for the following:    Lactic Acid, Venous 3.18 (*)    All other components within normal limits  CBG MONITORING, ED - Abnormal; Notable for the following:    Glucose-Capillary 508 (*)    All other components within normal limits  CBG MONITORING, ED - Abnormal; Notable for the following:    Glucose-Capillary 482 (*)    All other components within normal limits  CULTURE, BLOOD (ROUTINE X 2)  CULTURE, BLOOD (ROUTINE X 2)  URINE CULTURE  I-STAT TROPONIN, ED  I-STAT CG4 LACTIC ACID, ED  I-STAT CHEM 8, ED    EKG  EKG Interpretation  Date/Time:  Wednesday February 01 2017 16:12:21 EDT Ventricular Rate:  107 PR Interval:  140 QRS Duration: 68 QT Interval:  310 QTC Calculation: 413 R Axis:   -26 Text Interpretation:  Sinus tachycardia Cannot rule out Anterior infarct , age undetermined Abnormal ECG Confirmed by Rolland PorterJames, Morgaine Kimball ([id number]) on 02/01/2017 5:57:49 PM       Radiology Dg Chest Port 1 View  Result Date: 02/01/2017 CLINICAL DATA:  Altered mental status, question sepsis. EXAM: PORTABLE CHEST 1 VIEW COMPARISON:  06/08/2016 FINDINGS: The heart size and mediastinal contours are within normal limits. There is minimal atelectasis at the left lung base. There is mild uncoiling of the thoracic aorta, similar in appearance to prior. No acute nor suspicious osseous abnormalities. High-riding humeral heads are noted bilaterally which may reflect chronic rotator cuff tears. IMPRESSION: No active disease. Electronically Signed   By: Tollie Ethavid  Kwon M.D.   On:  02/01/2017 19:01    Procedures Procedures (including critical care time)  Medications Ordered in ED Medications  dextrose 5 %-0.45 % sodium chloride infusion (not administered)  insulin regular (NOVOLIN R,HUMULIN R) 100 Units in sodium chloride 0.9 % 100 mL (1 Units/mL) infusion (4.2 Units/hr Intravenous New Bag/Given 02/01/17 1946)  sodium chloride 0.9 % bolus 1,000 mL (1,000 mLs Intravenous New Bag/Given 02/01/17 1818)    And  sodium chloride 0.9 % bolus 500 mL (0 mLs Intravenous Stopped 02/01/17 2001)    And  sodium chloride 0.9 % bolus 250 mL (250 mLs Intravenous New Bag/Given 02/01/17 1834)  cefTRIAXone (ROCEPHIN) 2 g in dextrose 5 % 50 mL IVPB (0 g Intravenous Stopped 02/01/17 2001)  calcium gluconate 1 g in sodium chloride 0.9 % 100 mL IVPB (0 g Intravenous Stopped 02/01/17 2001)     Initial Impression / Assessment and Plan / ED Course  I have reviewed the triage vital signs and the nursing notes.  Pertinent labs & imaging results that were available during my care of the patient were reviewed by me and considered in my medical decision making (see chart for details).     Elderly male. Probable sepsis. Is afebrile even on rectal exam. However is hypotensive and tachycardic. His urine has the appearance of milk, and appears obviously infected. Blood cultures were obtained. Was given sepsis fluids at 30/kg. IV Rocephin 2 grams. Code sepsis initiated. Potassium is high at 6.9. Hyperglycemic over 500. Acidotic with bicarbonate of 11. However anion gap of only 11. EKG shows prominant T-wave in V2 only. Does not have any other stigmata of hyperkalemia. I suspect his potassium has gotten high slowly.  Catheter is placed. Indication is sepsis, SKI need for ongoing UO trend.  He is starting to produce urine. His heart rate has decreased. His blood pressure is 120. His mental status remains poor. He  was given Rocephin 2 g IV for his UTI and urosepsis.  Angiocath insertion Performed by:  Claudean Kinds  Consent: Verbal consent obtained. Risks and benefits: risks, benefits and alternatives were discussed Time out: Immediately prior to procedure a "time out" was called to verify the correct patient, procedure, equipment, support staff and site/side marked as required.  Preparation: Patient was prepped and draped in the usual sterile fashion.  Vein Location: RUE Proximal  +Ultrasound Guided  Gauge: 20  Normal blood return and flush without difficulty Patient tolerance: Patient tolerated the procedure well with no immediate complications.  Angiocath insertion Performed by: Claudean Kinds  Consent: Verbal consent obtained. Risks and benefits: risks, benefits and alternatives were discussed Time out: Immediately prior to procedure a "time out" was called to verify the correct patient, procedure, equipment, support staff and site/side marked as required.  Preparation: Patient was prepped and draped in the usual sterile fashion.  Vein Location: lue UPPER  +Ultrasound Guided  Gauge: +  Normal blood return and flush without difficulty Patient tolerance: Patient tolerated the procedure well with no immediate complications.  CRITICAL CARE Performed by: Rolland Porter JOSEPH   Total critical care time: 65 minutes  Critical care time was exclusive of separately billable procedures and treating other patients.  Critical care was necessary to treat or prevent imminent or life-threatening deterioration.  Critical care was time spent personally by me on the following activities: development of treatment plan with patient and/or surrogate as well as nursing, discussions with consultants, evaluation of patient's response to treatment, examination of patient, obtaining history from patient or surrogate, ordering and performing treatments and interventions, ordering and review of laboratory studies, ordering and review of radiographic studies, pulse oximetry and  re-evaluation of patient's condition.   Discussed with Dr. Ophelia Charter. We're currently repeating basic metabolic profile to see if his potassium has improved. His been on insulin drip for approximately one hour now.   Final Clinical Impressions(s) / ED Diagnoses   Final diagnoses:  Sepsis, due to unspecified organism [facility])  Urinary tract infection with hematuria, site unspecified  Hypotension, unspecified hypotension type  AKI (acute kidney injury) (HCC)  Hyperglycemia    New Prescriptions New Prescriptions   No medications on file     Rolland Porter, MD 02/01/17 2012    Rolland Porter, MD 02/01/17 2227

## 2017-02-01 NOTE — H&P (Addendum)
History and Physical    Eric Cameron ZOX:[id number] DOB: [birth year] DOA: 02/01/2017  PCP: No primary care provider on file. Patient coming from:  Home - lives with his 2 elderly sisters   Chief Complaint: AMS  HPI: Eric Cameron is a 81 y.o. male with medical history significant of seizures, dementia, HTN, and DM presenting with AMS.  The patient was unaccompanied at the time of my evaluation and unable to answer any questions other than that he was "pretty good."  He was curled up in a fetal position.  HPI per Dr. Fayrene Fearing:  Lives at home. His care is given by his elderly 2 sisters with whom he lives. His sister accompanies him here and provides his history.  Normally he does get up and out of bed and walks. Normally feeds himself with his sisters help. He Is confused on a regular basis but does communicate.  He had a considerable change "a few days ago". Sr. States it was 2 or 3 days. Became less active. He had less desire to eat. He has drank a little. He did eat some yogurt this morning. He would not get out of bed today. Sister states is because he was weak. She did not feel those is because he simply did not want to.  She's not noticed cough, fever, or vomiting. She does not know if he has urinated or had a bowel movement today. Has not recognized any diarrhea.  He has a history of diabetes. Is not currently on insulin although he "used to be". He is on "a pill"  ED Course: Sepsis but no fever.  Hypotensive, tachycardic.  "Urine has the appearance of milk."  Blood cultures pending.  30 cc/kg bolus and then an additional bolus were given for hypotension.  Code sepsis.  K" 6.9.  Glucose >500.  Bicarb 11.  Anion gap 11.  Foley with urine return.  Clearly AMS.  Rocephin 2 grams IV.  Insulin drip.  Review of Systems: Unable to assess   PMH, PSH, SH, and FH were reviewed in Epic  Past Medical History:  Diagnosis Date  . Acute encephalopathy   . Alzheimer disease   . Dehydration   .  Dementia   . Depression   . Diabetes mellitus without complication (HCC)   . Elevated troponin   . Hypertension   . PNA (pneumonia)   . Pressure ulcer   . Seizures (HCC)     Past Surgical History:  Procedure Laterality Date  . NO PAST SURGERIES      Social History   Social History  . Marital status: Single    Spouse name: N/A  . Number of children: 0  . Years of education: 4   Occupational History  . retired    Social History Main Topics  . Smoking status: Former Smoker    Quit date: 07/07/2010  . Smokeless tobacco: Never Used  . Alcohol use No  . Drug use: No  . Sexual activity: Not on file   Other Topics Concern  . Not on file   Social History Narrative   Lives with sister and brother   Caffeine use: Tea    Very little coffee   Soda sometimes    No Known Allergies  Family History  Problem Relation Age of Onset  . Dementia Mother   . Colon cancer Father   . Diabetes Mellitus II Brother     Prior to Admission medications   Medication Sig Start Date End Date Taking? Authorizing  Provider  Amino Acids (HIGH PROTEIN PO) Take 30 mLs by mouth 2 (two) times daily.    Yes [provider]  aspirin 325 MG tablet Take 1 tablet (325 mg total) by mouth daily. 12/03/13  Yes Mikhail, Nita Sells, DO  atorvastatin (LIPITOR) 20 MG tablet Take 20 mg by mouth daily.   Yes [provider]  folic acid (FOLVITE) 1 MG tablet Take 1 tablet (1 mg total) by mouth daily. 07/10/15  Yes Meredeth Ide, MD  lisinopril (PRINIVIL,ZESTRIL) 2.5 MG tablet Take 2.5 mg by mouth daily.   Yes [provider]  memantine (NAMENDA) 10 MG tablet Take 10 mg by mouth 2 (two) times daily.    Yes [provider]  metFORMIN (GLUCOPHAGE-XR) 500 MG 24 hr tablet Take 1,000 mg by mouth daily. 01/31/17  Yes [provider]  Nutritional Supplements (NUTRITIONAL SUPPLEMENT PLUS) LIQD Take 1 Bottle by mouth 2 (two) times daily. Med Pass 120 mL   Yes [provider]   thiamine 100 MG tablet Take 1 tablet (100 mg total) by mouth daily. 07/10/15  Yes Meredeth Ide, MD  vitamin B-12 (CYANOCOBALAMIN) 100 MCG tablet Take 100 mcg by mouth daily.   Yes [provider]  insulin glargine (LANTUS) 100 UNIT/ML injection Inject 0.05 mLs (5 Units total) into the skin at bedtime. Patient not taking: Reported on 02/01/2017 06/11/16   Narda Bonds, MD    Physical Exam: Vitals:   02/01/17 2130 02/01/17 2140 02/01/17 2150 02/01/17 2151  BP: 104/60 95/60 (!) 110/58   Pulse: (!) 106 (!) 108  (!) 109  Resp: 17  20 17   Temp:      TempSrc:      SpO2: 97% 94%  100%     General: Lying in the fetal position.  Opens eyes to voice, states that he is "pretty good" but does not attempt to answer additional questions. Eyes:  PERRL, EOMI, normal lids, iris ENT:  Dry mm Neck:  no LAD, masses or thyromegaly; no carotid bruits Cardiovascular:  Mild tachycardia, no m/r/g. No LE edema.  Respiratory:   CTA bilaterally with no wheezes/rales/rhonchi.  Normal respiratory effort. Abdomen:  soft, NT, ND, NABS Back:   normal alignment, no CVAT Skin:  no rash or induration seen on limited exam Musculoskeletal:  grossly normal tone BUE/BLE, good ROM, no bony abnormality.  Cachectic. Psychiatric: Delirium vs. Dementia, only responds that he is "pretty good" but will not attempt to state his name. Neurologic: Unable to perform    Radiological Exams on Admission: Dg Chest Port 1 View  Result Date: 02/01/2017 CLINICAL DATA:  Altered mental status, question sepsis. EXAM: PORTABLE CHEST 1 VIEW COMPARISON:  06/08/2016 FINDINGS: The heart size and mediastinal contours are within normal limits. There is minimal atelectasis at the left lung base. There is mild uncoiling of the thoracic aorta, similar in appearance to prior. No acute nor suspicious osseous abnormalities. High-riding humeral heads are noted bilaterally which may reflect chronic rotator cuff tears. IMPRESSION: No active  disease. Electronically Signed   By: Tollie Eth M.D.   On: 02/01/2017 19:01    EKG: Independently reviewed.  Sinus tachycardia with rate 107; nonspecific ST changes with no evidence of acute ischemia   Labs on Admission: I have personally reviewed the available labs and imaging studies at the time of the admission.  Pertinent labs:   K+ 6.9 CO2 11 Glucose 566, 482, 449 BUN 142/Creatinine 3.68/GFR 17; 26/0.89/>60 in 2/18 Calcium 10.9 Albumin 2.1 Lactate 3.18,  4.05 WBC 27.3 Hgb 8.2; 10.4 in 2/18 Platelets 613 UA: moderate Hgb, moderate LE, negative nitrite, 100 protein, few bacteria, TNTC WNC Troponin 0.00  Assessment/Plan Principal Problem:   Severe sepsis (HCC) Active Problems:   HTN (hypertension)   Seizures (HCC)   Alzheimer's dementia without behavioral disturbance   UTI (urinary tract infection)   Acute renal failure (ARF) (HCC)   DKA (diabetic ketoacidosis) (HCC)   Sepsis due to UTI, severe sepsis, possible shock -Markedly elevated WBC count, tachypnea, tachycardia with elevated lactate to 3.2 and uptrending to 4.1 and mild hypotension -While awaiting blood cultures, this appears to be a preseptic condition concerning for severe sepsis and/or septic shock. -Sepsis protocol initiated; received all of IVF bolus but ongoing hypotension and tachycardia and so bolused again  -Suspect urinary source  -Blood and urine cultures pending -Will admit to SDU and continue to monitor -Treated with IV Rocephin in the ER but will broaden coverage to Vanc/Zosyn empirically until certain of UTI as the source -Will trend lactate to ensure improvement -Will order procalcitonin level.  Antibiotics would not be indicated for PCT <0.1 and probably should not be used for < 0.25.  >0.5 indicates infection and >>0.5 indicates more serious disease.  As the procalcitonin level normalizes, it will be reasonable to consider de-escalation of antibiotic coverage.  DKA -Patient with suboptimal  baseline control, A1c 8.7 in 2/18 -DKA likely resulting from severe infection -Moderate DKA on admission based on CO2 11, patient drowsy -Will admit to SDU with DKA protocol -Would recommend continuing insulin drip at least until morning regardless of rapidity of closure of gap and normalization of labs -K+ increased at time of presentation but this is very likely due to shifts and is expected to correct with ongoing hydration and insulin -IVF at 150 cc/hr, NS until glucose <250 and then decrease rate to 125 and change to D51/2NS -NPO -Check BMP q4h for now  Acute renal failure  -Likely due to prerenal failure secondary to dehydration and DKA as well as continuation of ACEI and Glucophage. -IVF as above -Follow up renal function by BMP q4h -Avoid ACEI and NSAIDs  HTN -Currently with hypotension -Hold all meds  Alzheimer's -Uncertain baseline - elderly sister reports that patient is able to walk/talk/eat  -Hold Namenda for now  Seizures -Has h/o taking Vimpat, which has periodically dropped off his medication list -He does not appear to be taking any seizure medications at this time -EEG in 2/18 did not show any epileptiform activity -No further seizure medications recommended unless seizure activity recurs  DVT prophylaxis: Lovenox Code Status:  Full  Family Communication: None present  Disposition Plan:  To be determined Consults called: None Admission status: Admit - It is my clinical opinion that admission to INPATIENT is reasonable and necessary because this patient will require at least 2 midnights in the hospital to treat this condition based on the medical complexity of the problems presented.  Given the aforementioned information, the predictability of an adverse outcome is felt to be significant.  Total critical care time: 55 minutes Critical care time was exclusive of separately billable procedures and treating other patients. Critical care was necessary to treat or  prevent imminent or life-threatening deterioration. Critical care was time spent personally by me on the following activities: development of treatment plan with patient and/or surrogate as well as nursing, discussions with consultants, evaluation of patient's response to treatment, examination of patient, obtaining history from patient or surrogate, ordering and performing treatments and interventions,  ordering and review of laboratory studies, ordering and review of radiographic studies, pulse oximetry and re-evaluation of patient's condition.   Jonah Blue MD [facility]  If note is complete, please contact covering daytime or nighttime physician. [email or web address] Password [email or web address]  02/01/2017, 10:32 PM

## 2017-02-02 DIAGNOSIS — I10 Essential (primary) hypertension: Secondary | ICD-10-CM

## 2017-02-02 DIAGNOSIS — R739 Hyperglycemia, unspecified: Secondary | ICD-10-CM

## 2017-02-02 DIAGNOSIS — I959 Hypotension, unspecified: Secondary | ICD-10-CM

## 2017-02-02 DIAGNOSIS — N179 Acute kidney failure, unspecified: Secondary | ICD-10-CM

## 2017-02-02 LAB — GLUCOSE, CAPILLARY
GLUCOSE-CAPILLARY: 117 mg/dL — AB (ref 65–99)
GLUCOSE-CAPILLARY: 151 mg/dL — AB (ref 65–99)
GLUCOSE-CAPILLARY: 204 mg/dL — AB (ref 65–99)
GLUCOSE-CAPILLARY: 205 mg/dL — AB (ref 65–99)
GLUCOSE-CAPILLARY: 205 mg/dL — AB (ref 65–99)
Glucose-Capillary: 113 mg/dL — ABNORMAL HIGH (ref 65–99)
Glucose-Capillary: 149 mg/dL — ABNORMAL HIGH (ref 65–99)
Glucose-Capillary: 157 mg/dL — ABNORMAL HIGH (ref 65–99)
Glucose-Capillary: 145 mg/dL — ABNORMAL HIGH (ref 65–99)
Glucose-Capillary: 167 mg/dL — ABNORMAL HIGH (ref 65–99)
Glucose-Capillary: 176 mg/dL — ABNORMAL HIGH (ref 65–99)
Glucose-Capillary: 195 mg/dL — ABNORMAL HIGH (ref 65–99)
Glucose-Capillary: 73 mg/dL (ref 65–99)
Glucose-Capillary: 127 mg/dL — ABNORMAL HIGH (ref 65–99)
Glucose-Capillary: 135 mg/dL — ABNORMAL HIGH (ref 65–99)

## 2017-02-02 LAB — BASIC METABOLIC PANEL
ANION GAP: 11 (ref 5–15)
ANION GAP: 11 (ref 5–15)
ANION GAP: 8 (ref 5–15)
ANION GAP: 9 (ref 5–15)
Anion gap: 10 (ref 5–15)
BUN: 106 mg/dL — ABNORMAL HIGH (ref 6–20)
BUN: 83 mg/dL — AB (ref 6–20)
BUN: 85 mg/dL — AB (ref 6–20)
BUN: 89 mg/dL — ABNORMAL HIGH (ref 6–20)
BUN: 96 mg/dL — ABNORMAL HIGH (ref 6–20)
CALCIUM: 10.3 mg/dL (ref 8.9–10.3)
CALCIUM: 10.1 mg/dL (ref 8.9–10.3)
CALCIUM: 10 mg/dL (ref 8.9–10.3)
CHLORIDE: 126 mmol/L — AB (ref 101–111)
CHLORIDE: 126 mmol/L — AB (ref 101–111)
CHLORIDE: 125 mmol/L — AB (ref 101–111)
CHLORIDE: 125 mmol/L — AB (ref 101–111)
CO2: 11 mmol/L — AB (ref 22–32)
CO2: 12 mmol/L — AB (ref 22–32)
CO2: 13 mmol/L — AB (ref 22–32)
CO2: 13 mmol/L — AB (ref 22–32)
CO2: 14 mmol/L — AB (ref 22–32)
CREATININE: 1.71 mg/dL — AB (ref 0.61–1.24)
CREATININE: 1.67 mg/dL — AB (ref 0.61–1.24)
Calcium: 10.7 mg/dL — ABNORMAL HIGH (ref 8.9–10.3)
Calcium: 10.2 mg/dL (ref 8.9–10.3)
Chloride: 122 mmol/L — ABNORMAL HIGH (ref 101–111)
Creatinine, Ser: 2.28 mg/dL — ABNORMAL HIGH (ref 0.61–1.24)
Creatinine, Ser: 2.04 mg/dL — ABNORMAL HIGH (ref 0.61–1.24)
Creatinine, Ser: 1.73 mg/dL — ABNORMAL HIGH (ref 0.61–1.24)
GFR calc Af Amer: 43 mL/min — ABNORMAL LOW (ref >60)
GFR calc non Af Amer: 25 mL/min — ABNORMAL LOW (ref >60)
GFR calc non Af Amer: 29 mL/min — ABNORMAL LOW (ref >60)
GFR calc non Af Amer: 36 mL/min — ABNORMAL LOW (ref >60)
GFR calc non Af Amer: 36 mL/min — ABNORMAL LOW (ref >60)
GFR calc non Af Amer: 37 mL/min — ABNORMAL LOW (ref >60)
GFR, EST AFRICAN AMERICAN: 29 mL/min — AB (ref >60)
GFR, EST AFRICAN AMERICAN: 34 mL/min — AB (ref >60)
GFR, EST AFRICAN AMERICAN: 41 mL/min — AB (ref >60)
GFR, EST AFRICAN AMERICAN: 42 mL/min — AB (ref >60)
Glucose, Bld: 131 mg/dL — ABNORMAL HIGH (ref 65–99)
Glucose, Bld: 145 mg/dL — ABNORMAL HIGH (ref 65–99)
Glucose, Bld: 197 mg/dL — ABNORMAL HIGH (ref 65–99)
Glucose, Bld: 139 mg/dL — ABNORMAL HIGH (ref 65–99)
Glucose, Bld: 117 mg/dL — ABNORMAL HIGH (ref 65–99)
POTASSIUM: 4.5 mmol/L (ref 3.5–5.1)
Potassium: 4.6 mmol/L (ref 3.5–5.1)
Potassium: 4.8 mmol/L (ref 3.5–5.1)
Potassium: 4.2 mmol/L (ref 3.5–5.1)
Potassium: 4.5 mmol/L (ref 3.5–5.1)
SODIUM: 148 mmol/L — AB (ref 135–145)
SODIUM: 148 mmol/L — AB (ref 135–145)
Sodium: 144 mmol/L (ref 135–145)
Sodium: 148 mmol/L — ABNORMAL HIGH (ref 135–145)
Sodium: 148 mmol/L — ABNORMAL HIGH (ref 135–145)

## 2017-02-02 LAB — URINE CULTURE

## 2017-02-02 LAB — BLOOD CULTURE ID PANEL (REFLEXED)
ACINETOBACTER BAUMANNII: NOT DETECTED
CANDIDA ALBICANS: NOT DETECTED
CANDIDA GLABRATA: NOT DETECTED
CANDIDA PARAPSILOSIS: NOT DETECTED
CANDIDA TROPICALIS: NOT DETECTED
Candida krusei: NOT DETECTED
ENTEROBACTER CLOACAE COMPLEX: NOT DETECTED
ENTEROBACTERIACEAE SPECIES: NOT DETECTED
Enterococcus species: NOT DETECTED
Escherichia coli: NOT DETECTED
Haemophilus influenzae: NOT DETECTED
KLEBSIELLA OXYTOCA: NOT DETECTED
KLEBSIELLA PNEUMONIAE: NOT DETECTED
Listeria monocytogenes: NOT DETECTED
Neisseria meningitidis: NOT DETECTED
Proteus species: NOT DETECTED
Pseudomonas aeruginosa: NOT DETECTED
Serratia marcescens: NOT DETECTED
Staphylococcus aureus (BCID): NOT DETECTED
Staphylococcus species: NOT DETECTED
Streptococcus agalactiae: NOT DETECTED
Streptococcus pneumoniae: NOT DETECTED
Streptococcus pyogenes: NOT DETECTED
Streptococcus species: NOT DETECTED

## 2017-02-02 LAB — MRSA PCR SCREENING: MRSA by PCR: NEGATIVE

## 2017-02-02 LAB — LACTIC ACID, PLASMA
Lactic Acid, Venous: 2.6 mmol/L (ref 0.5–1.9)
Lactic Acid, Venous: 2.7 mmol/L (ref 0.5–1.9)

## 2017-02-02 LAB — HEMOGLOBIN A1C
Hgb A1c MFr Bld: 7.9 % — ABNORMAL HIGH (ref 4.8–5.6)
MEAN PLASMA GLUCOSE: 180.03 mg/dL

## 2017-02-02 LAB — PROCALCITONIN: Procalcitonin: 3.23 ng/mL

## 2017-02-02 MED ORDER — INSULIN GLARGINE 100 UNIT/ML ~~LOC~~ SOLN
10.0000 [IU] | Freq: Every day | SUBCUTANEOUS | Status: DC
  Administered 2017-02-02 – 2017-02-09 (×8): 10 [IU] via SUBCUTANEOUS
  Filled 2017-02-02 (×9): qty 0.1

## 2017-02-02 MED ORDER — VANCOMYCIN HCL 500 MG IV SOLR
500.0000 mg | INTRAVENOUS | Status: DC
  Administered 2017-02-03: 500 mg via INTRAVENOUS
  Filled 2017-02-02: qty 500

## 2017-02-02 MED ORDER — INSULIN ASPART 100 UNIT/ML ~~LOC~~ SOLN
0.0000 [IU] | SUBCUTANEOUS | Status: DC
  Administered 2017-02-02 – 2017-02-03 (×2): 2 [IU] via SUBCUTANEOUS
  Administered 2017-02-03: 1 [IU] via SUBCUTANEOUS
  Administered 2017-02-03: 2 [IU] via SUBCUTANEOUS
  Administered 2017-02-04 (×2): 1 [IU] via SUBCUTANEOUS
  Administered 2017-02-05 (×2): 2 [IU] via SUBCUTANEOUS
  Administered 2017-02-05: 3 [IU] via SUBCUTANEOUS
  Administered 2017-02-05: 2 [IU] via SUBCUTANEOUS
  Administered 2017-02-05 (×2): 3 [IU] via SUBCUTANEOUS
  Administered 2017-02-06: 1 [IU] via SUBCUTANEOUS
  Administered 2017-02-06: 2 [IU] via SUBCUTANEOUS
  Administered 2017-02-06: 3 [IU] via SUBCUTANEOUS
  Administered 2017-02-06 (×2): 2 [IU] via SUBCUTANEOUS
  Administered 2017-02-06: 3 [IU] via SUBCUTANEOUS
  Administered 2017-02-06: 7 [IU] via SUBCUTANEOUS
  Administered 2017-02-07: 5 [IU] via SUBCUTANEOUS
  Administered 2017-02-07: 3 [IU] via SUBCUTANEOUS
  Administered 2017-02-07 (×2): 2 [IU] via SUBCUTANEOUS
  Administered 2017-02-07: 3 [IU] via SUBCUTANEOUS
  Administered 2017-02-08: 2 [IU] via SUBCUTANEOUS
  Administered 2017-02-08: 3 [IU] via SUBCUTANEOUS
  Administered 2017-02-09 (×2): 1 [IU] via SUBCUTANEOUS
  Administered 2017-02-09 – 2017-02-10 (×2): 3 [IU] via SUBCUTANEOUS
  Administered 2017-02-10: 2 [IU] via SUBCUTANEOUS
  Administered 2017-02-10: 3 [IU] via SUBCUTANEOUS

## 2017-02-02 MED ORDER — PIPERACILLIN-TAZOBACTAM 3.375 G IVPB
3.3750 g | Freq: Three times a day (TID) | INTRAVENOUS | Status: DC
  Administered 2017-02-02 – 2017-02-08 (×18): 3.375 g via INTRAVENOUS
  Filled 2017-02-02 (×21): qty 50

## 2017-02-02 MED ORDER — LACTATED RINGERS IV SOLN
INTRAVENOUS | Status: DC
  Administered 2017-02-02 – 2017-02-03 (×3): via INTRAVENOUS

## 2017-02-02 NOTE — Progress Notes (Signed)
PROGRESS NOTE  Brodric Schauer GNF:[id number] DOB: [birth year] DOA: 02/01/2017 PCP: No primary care provider on file.   LOS: 1 day   Brief Narrative / Interim history: 82-year-old male with history of seizures, dementia, hypertension, diabetes who presented to the hospital with altered mental status.  Patient has underlying dementia, and lives with his sisters, who tells me over the phone that over the last couple of days he has been more lethargic than normal.  He was found to be septic with a UTI and was admitted to the hospital broad-spectrum antibiotics.  Assessment & Plan: Principal Problem:   Severe sepsis (HCC) Active Problems:   HTN (hypertension)   Seizures (HCC)   Alzheimer's dementia without behavioral disturbance   UTI (urinary tract infection)   Acute renal failure (ARF) (HCC)   DKA (diabetic ketoacidosis) (HCC)   Sepsis due to urinary tract infection -Patient with tachycardia, tachypnea, elevated white count, elevated lactic acid and hypotension -Started on vancomycin and Zosyn, cultures are pending, continue for now -Sepsis physiology slightly improving today, is not as tachycardic and his blood pressure seems to be more stable  Hyperglycemic state / DM -Patient without anion gap, his bicarb is low but this is likely in the setting of renal failure, acute was placed on insulin infusion overnight, CBGs are now improved, will give Lantus and sliding scale every 4 hours -On metformin at home, CBGs elevated likely due to sepsis -Most recent A1c of 8.7 in February 2018, recheck another one  NAGMA -In the setting of renal failure, change fluids to Ringer's lactate  Acute kidney injury -Patient with prior normal renal function, creatinine on admission 3.6 likely in the setting of sepsis -Continue fluids, improved to 1.7 this morning  Hypernatremia -Likely due to dehydration, fluids  History of seizure disorder -He is being followed by neurology as an outpatient, does  not appear to be on any AED right now  Dementia -Baseline is not clear to me, apparently he has a good appetite and is eating well however he has been having persistent confusion even at home  Hypertension -Currently septic, hold blood pressure meds  DVT prophylaxis: Lovenox Code Status: DNR (formed with the patient's sister/POA over the phone) Family Communication: Discussed with sister  Callas the phone Disposition Plan: Remain in stepdown  Consultants:   None  Procedures:   None   Antimicrobials:  Vancomycin 10/17 >>  Zosyn 10/17 >>   Subjective: -In fetal position in bed, no complaints, states everything is "okay".  Dementia limits interaction  Objective: Vitals:   02/02/17 0300 02/02/17 0400 02/02/17 0500 02/02/17 0600  BP: (!) 104/58 103/65 (!) 106/58 (!) 108/51  Pulse: (!) 111 (!) 110 (!) 116 (!) 116  Resp: (!) 21 (!) 22 (!) 23 (!) 23  Temp: 98.6 F (37 C) 99.1 F (37.3 C) 99.5 F (37.5 C) 99.9 F (37.7 C)  TempSrc:      SpO2: 100% 99% 100% 97%  Weight:      Height:        Intake/Output Summary (Last 24 hours) at 02/02/17 1108 Last data filed at 02/02/17 1000  Gross per 24 hour  Intake           1477.5 ml  Output             3225 ml  Net          -1747.5 ml   Filed Weights   02/01/17 2300 02/01/17 2326  Weight: 47.8 kg (105 lb 6.1 oz) 47.8 kg (  105 lb 6.1 oz)    Examination:  Constitutional: Cachectic appearing African-American male sitting in a fetal position in bed Eyes:  lids and conjunctivae normal, no scleral icterus ENMT: Mucous membranes are dry Respiratory: clear to auscultation bilaterally, no wheezing, no crackles. Normal respiratory effort.  Cardiovascular: Regular rate and rhythm, no murmurs / rubs / gallops. No LE edema.  Abdomen: no tenderness. Bowel sounds positive.  Neurologic: Equal strength Psychiatric: Alert to self only  Data Reviewed: I have independently reviewed following labs and imaging studies    CBC:  Recent Labs Lab 2017/02/02 1640 02-02-17 2053  WBC 27.3*  --   NEUTROABS 24.5*  --   HGB 8.2* 9.9*  HCT 26.6* 29.0*  MCV 86.4  --   PLT 613*  --    Basic Metabolic Panel:  Recent Labs Lab Feb 02, 2017 1640 02/02/17 2053 02/02/17 0024 02/02/17 0256 02/02/17 0722  NA 136 146* 144 148* 148*  K 6.9* 6.2* 4.5 4.6 4.8  CL 114* 123* 122* 126* 126*  CO2 11*  --  13* 14* 11*  GLUCOSE 566* 478* 131* 145* 197*  BUN 142* 115* 106* 96* 89*  CREATININE 3.68* 3.00* 2.28* 2.04* 1.73*  CALCIUM 10.9*  --  10.7* 10.2 10.3   GFR: Estimated Creatinine Clearance: 23 mL/min (A) (by C-G formula based on SCr of 1.73 mg/dL (H)). Liver Function Tests:  Recent Labs Lab 02/02/17 1640  AST 17  ALT 19  ALKPHOS 81  BILITOT 0.3  PROT 9.0*  ALBUMIN 2.1*   No results for input(s): LIPASE, AMYLASE in the last 168 hours. No results for input(s): AMMONIA in the last 168 hours. Coagulation Profile:  Recent Labs Lab 02-02-2017 1640  INR 1.27   Cardiac Enzymes: No results for input(s): CKTOTAL, CKMB, CKMBINDEX, TROPONINI in the last 168 hours. BNP (last 3 results) No results for input(s): PROBNP in the last 8760 hours. HbA1C: No results for input(s): HGBA1C in the last 72 hours. CBG:  Recent Labs Lab 02/02/17 0547 02/02/17 0647 02/02/17 0752 02/02/17 0902 02/02/17 1006  GLUCAP 167* 176* 205* 205* 195*   Lipid Profile: No results for input(s): CHOL, HDL, LDLCALC, TRIG, CHOLHDL, LDLDIRECT in the last 72 hours. Thyroid Function Tests: No results for input(s): TSH, T4TOTAL, FREET4, T3FREE, THYROIDAB in the last 72 hours. Anemia Panel: No results for input(s): VITAMINB12, FOLATE, FERRITIN, TIBC, IRON, RETICCTPCT in the last 72 hours. Urine analysis:    Component Value Date/Time   COLORURINE YELLOW February 02, 2017 1728   APPEARANCEUR TURBID (A) 02-02-17 1728   LABSPEC 1.013 2017/02/02 1728   PHURINE 5.0 02-02-17 1728   GLUCOSEU NEGATIVE 02-02-2017 1728   HGBUR MODERATE (A)  February 02, 2017 1728   BILIRUBINUR NEGATIVE 02-02-17 1728   KETONESUR NEGATIVE February 02, 2017 1728   PROTEINUR 100 (A) 02/02/2017 1728   UROBILINOGEN 1.0 11/30/2013 1734   NITRITE NEGATIVE Feb 02, 2017 1728   LEUKOCYTESUR MODERATE (A) 02-02-2017 1728   Sepsis Labs: Invalid input(s): PROCALCITONIN, LACTICIDVEN  Recent Results (from the past 240 hour(s))  MRSA PCR Screening     Status: None   Collection Time: Feb 02, 2017 11:15 PM  Result Value Ref Range Status   MRSA by PCR NEGATIVE NEGATIVE Final    Comment:        The GeneXpert MRSA Assay (FDA approved for NASAL specimens only), is one component of a comprehensive MRSA colonization surveillance program. It is not intended to diagnose MRSA infection nor to guide or monitor treatment for MRSA infections.      Radiology Studies: Dg Chest [facility]  1 View  Result Date: 02/01/2017 CLINICAL DATA:  Altered mental status, question sepsis. EXAM: PORTABLE CHEST 1 VIEW COMPARISON:  06/08/2016 FINDINGS: The heart size and mediastinal contours are within normal limits. There is minimal atelectasis at the left lung base. There is mild uncoiling of the thoracic aorta, similar in appearance to prior. No acute nor suspicious osseous abnormalities. High-riding humeral heads are noted bilaterally which may reflect chronic rotator cuff tears. IMPRESSION: No active disease. Electronically Signed   By: Tollie Ethavid  Kwon M.D.   On: 02/01/2017 19:01    Scheduled Meds: . enoxaparin (LOVENOX) injection  30 mg Subcutaneous Daily  . insulin aspart  0-9 Units Subcutaneous Q4H  . insulin glargine  10 Units Subcutaneous Daily   Continuous Infusions: . lactated ringers 100 mL/hr at 02/02/17 1035  . piperacillin-tazobactam (ZOSYN)  IV    . [START ON 02/03/2017] vancomycin       Pamella Pertostin Ayson Cherubini, MD, PhD [facility] Pager [phone]  If 7PM-7AM, please contact night-coverage [email or web address] Password [email or web address] 02/02/2017, 11:08 AM

## 2017-02-02 NOTE — Progress Notes (Signed)
Pharmacy Antibiotic Note  Eric Cameron is a 82 y.o. male admitted on 02/01/2017 with sepsis.  Pharmacy has been consulted for Vancomycin and Zosyn dosing.  The patient was noted to have AKI on admission with SCr 3 - now improved and trended down to 1.73 << 2.04, CrCl~20-30 ml/min. Will adjust antibiotic doses.   Plan: 1. Adjust Zosyn to 3.375g IV every 8 hours (infused over 4 hours) 2. Adjust Vancomycin to 500 mg IV every 24 hours 3. Will continue to follow renal function, culture results, LOT, and antibiotic de-escalation plans   Temp (24hrs), Avg:98.6 F (37 C), Min:97.7 F (36.5 C), Max:99.9 F (37.7 C)   Recent Labs Lab 02/01/17 1640 02/01/17 1649 02/01/17 2053 02/01/17 2054 02/02/17 0024 02/02/17 0256 02/02/17 0722  WBC 27.3*  --   --   --   --   --   --   CREATININE 3.68*  --  3.00*  --  2.28* 2.04* 1.73*  LATICACIDVEN  --  3.18*  --  4.05* 2.6* 2.7*  --     Estimated Creatinine Clearance: 23 mL/min (A) (by C-G formula based on SCr of 1.73 mg/dL (H)).    No Known Allergies  Antimicrobials this admission: CTX 10/17 x 1 Vanc 10/18 >> Zosyn 10/18 >>  Microbiology results:  10/17 UCx >> 10/17 BCx >> 10/17 MRSA PCR >> neg  Thank you for allowing pharmacy to be a part of this patient's care.  Georgina PillionElizabeth Jaid Quirion, PharmD, BCPS Clinical Pharmacist Pager: [phone] Clinical phone for 02/02/2017 from 7a-3:30p: [phone] If after 3:30p, please call main pharmacy at: [phone] 02/02/2017 10:27 AM

## 2017-02-02 NOTE — Progress Notes (Signed)
PHARMACY - PHYSICIAN COMMUNICATION CRITICAL VALUE ALERT - BLOOD CULTURE IDENTIFICATION (BCID)  Results for orders placed or performed during the hospital encounter of 02/01/17  Blood Culture ID Panel (Reflexed) (Collected: 02/01/2017  6:05 PM)  Result Value Ref Range   Enterococcus species NOT DETECTED NOT DETECTED   Listeria monocytogenes NOT DETECTED NOT DETECTED   Staphylococcus species NOT DETECTED NOT DETECTED   Staphylococcus aureus NOT DETECTED NOT DETECTED   Streptococcus species NOT DETECTED NOT DETECTED   Streptococcus agalactiae NOT DETECTED NOT DETECTED   Streptococcus pneumoniae NOT DETECTED NOT DETECTED   Streptococcus pyogenes NOT DETECTED NOT DETECTED   Acinetobacter baumannii NOT DETECTED NOT DETECTED   Enterobacteriaceae species NOT DETECTED NOT DETECTED   Enterobacter cloacae complex NOT DETECTED NOT DETECTED   Escherichia coli NOT DETECTED NOT DETECTED   Klebsiella oxytoca NOT DETECTED NOT DETECTED   Klebsiella pneumoniae NOT DETECTED NOT DETECTED   Proteus species NOT DETECTED NOT DETECTED   Serratia marcescens NOT DETECTED NOT DETECTED   Haemophilus influenzae NOT DETECTED NOT DETECTED   Neisseria meningitidis NOT DETECTED NOT DETECTED   Pseudomonas aeruginosa NOT DETECTED NOT DETECTED   Candida albicans NOT DETECTED NOT DETECTED   Candida glabrata NOT DETECTED NOT DETECTED   Candida krusei NOT DETECTED NOT DETECTED   Candida parapsilosis NOT DETECTED NOT DETECTED   Candida tropicalis NOT DETECTED NOT DETECTED    Name of physician (or Provider) Contacted: [facility]  Changes to prescribed antibiotics required: GPC in 1/4 with BCID negative. Likely contaminant but covered with Zosyn and vancomycin.   Sheron NightingaleJames A Makeisha Jentsch 02/02/2017  10:40 PM

## 2017-02-03 DIAGNOSIS — N3000 Acute cystitis without hematuria: Secondary | ICD-10-CM

## 2017-02-03 LAB — COMPREHENSIVE METABOLIC PANEL
ALT: 19 U/L (ref 17–63)
AST: 25 U/L (ref 15–41)
Albumin: 1.8 g/dL — ABNORMAL LOW (ref 3.5–5.0)
Alkaline Phosphatase: 63 U/L (ref 38–126)
Anion gap: 10 (ref 5–15)
BUN: 69 mg/dL — ABNORMAL HIGH (ref 6–20)
CALCIUM: 10.3 mg/dL (ref 8.9–10.3)
CHLORIDE: 128 mmol/L — AB (ref 101–111)
CO2: 15 mmol/L — ABNORMAL LOW (ref 22–32)
CREATININE: 1.51 mg/dL — AB (ref 0.61–1.24)
GFR, EST AFRICAN AMERICAN: 49 mL/min — AB (ref >60)
GFR, EST NON AFRICAN AMERICAN: 42 mL/min — AB (ref >60)
Glucose, Bld: 123 mg/dL — ABNORMAL HIGH (ref 65–99)
Potassium: 4.5 mmol/L (ref 3.5–5.1)
Sodium: 153 mmol/L — ABNORMAL HIGH (ref 135–145)
TOTAL PROTEIN: 7.7 g/dL (ref 6.5–8.1)
Total Bilirubin: 0.5 mg/dL (ref 0.3–1.2)

## 2017-02-03 LAB — CULTURE, BLOOD (ROUTINE X 2): Special Requests: ADEQUATE

## 2017-02-03 LAB — GLUCOSE, CAPILLARY
GLUCOSE-CAPILLARY: 102 mg/dL — AB (ref 65–99)
GLUCOSE-CAPILLARY: 110 mg/dL — AB (ref 65–99)
GLUCOSE-CAPILLARY: 117 mg/dL — AB (ref 65–99)
GLUCOSE-CAPILLARY: 143 mg/dL — AB (ref 65–99)
Glucose-Capillary: 143 mg/dL — ABNORMAL HIGH (ref 65–99)
Glucose-Capillary: 171 mg/dL — ABNORMAL HIGH (ref 65–99)

## 2017-02-03 LAB — CBC
HCT: 26.1 % — ABNORMAL LOW (ref 39.0–52.0)
Hemoglobin: 8 g/dL — ABNORMAL LOW (ref 13.0–17.0)
MCH: 26.2 pg (ref 26.0–34.0)
MCHC: 30.7 g/dL (ref 30.0–36.0)
MCV: 85.6 fL (ref 78.0–100.0)
PLATELETS: 537 10*3/uL — AB (ref 150–400)
RBC: 3.05 MIL/uL — AB (ref 4.22–5.81)
RDW: 15.8 % — ABNORMAL HIGH (ref 11.5–15.5)
WBC: 17.3 10*3/uL — AB (ref 4.0–10.5)

## 2017-02-03 MED ORDER — DEXTROSE 5 % IV SOLN
INTRAVENOUS | Status: DC
  Administered 2017-02-03 – 2017-02-07 (×6): via INTRAVENOUS

## 2017-02-03 MED ORDER — ACETAMINOPHEN 650 MG RE SUPP
650.0000 mg | Freq: Once | RECTAL | Status: AC
Start: 2017-02-03 — End: 2017-02-03
  Administered 2017-02-03: 650 mg via RECTAL
  Filled 2017-02-03: qty 1

## 2017-02-03 MED ORDER — ORAL CARE MOUTH RINSE
15.0000 mL | Freq: Two times a day (BID) | OROMUCOSAL | Status: DC
  Administered 2017-02-03 – 2017-02-11 (×14): 15 mL via OROMUCOSAL

## 2017-02-03 MED ORDER — PNEUMOCOCCAL VAC POLYVALENT 25 MCG/0.5ML IJ INJ
0.5000 mL | INJECTION | INTRAMUSCULAR | Status: AC
  Administered 2017-02-04: 0.5 mL via INTRAMUSCULAR
  Filled 2017-02-03: qty 0.5

## 2017-02-03 MED ORDER — INFLUENZA VAC SPLIT HIGH-DOSE 0.5 ML IM SUSY
0.5000 mL | PREFILLED_SYRINGE | INTRAMUSCULAR | Status: AC
  Administered 2017-02-04: 0.5 mL via INTRAMUSCULAR
  Filled 2017-02-03: qty 0.5

## 2017-02-03 MED ORDER — FLUCONAZOLE 100MG IVPB
100.0000 mg | INTRAVENOUS | Status: DC
  Administered 2017-02-03 – 2017-02-09 (×7): 100 mg via INTRAVENOUS
  Filled 2017-02-03 (×10): qty 50

## 2017-02-03 NOTE — Plan of Care (Signed)
Problem: Pain Managment: Goal: General experience of comfort will improve Outcome: Progressing Pt has shown no signs or symptoms of being in pain during this shift

## 2017-02-03 NOTE — Evaluation (Signed)
Clinical/Bedside Swallow Evaluation Patient Details  Name: Eric Cameron MRN: [id number] Date of Birth: 01-Dec-1934  Today's Date: 02/03/2017 Time: SLP Start Time (ACUTE ONLY): 1056 SLP Stop Time (ACUTE ONLY): 1117 SLP Time Calculation (min) (ACUTE ONLY): 21 min  Past Medical History:  Past Medical History:  Diagnosis Date  . Acute encephalopathy   . Alzheimer disease   . Dehydration   . Dementia   . Depression   . Diabetes mellitus without complication (HCC)   . Elevated troponin   . Hypertension   . PNA (pneumonia)   . Pressure ulcer   . Seizures (HCC)    Past Surgical History:  Past Surgical History:  Procedure Laterality Date  . NO PAST SURGERIES     HPI:  Ptis an 82 y.o.male presenting with AMS and found to have sepsis from UTI. PMH includes dementia, HTN, DM, seizures, PNA, dehydration, and depression. Pt has had swallow studies in the past, including MBS in 2015 that recommended Dys 3 diet and thin liquids. Since then he has had three clinical evaluations, and although he has been able to stay on thin liquids, his most recent diet recommendation was for Dys 1 textures due in large part to his cognitive status.   Assessment / Plan / Recommendation Clinical Impression  Pt appears to have a cognitively-based dysphagia that is likely exacerbated by acute illness. He takes in small amounts of POs with oral holding noted, particularly with purees, although this is facilitated with liquid washes. Pt has no overt s/s of aspiration, although he does become tachypneic (up to 36) during PO intake, which has the potential to impact his ability to coordinate swallowing/respirations. Recommend to remain NPO except for meds crushed in puree and small sips of water after oral care as long is pt is alert and upright (he tends to favor laying on his L side). Suspect good prognosis for return to PO diet as acute infection clears, although with his dementia I am not sure how much intake he  typically gets.  SLP Visit Diagnosis: Dysphagia, oral phase (R13.11)    Aspiration Risk  Moderate aspiration risk    Diet Recommendation NPO except meds;Free water protocol after oral care   Liquid Administration via: Straw Medication Administration: Crushed with puree Supervision: Full supervision/cueing for compensatory strategies;Staff to assist with self feeding Compensations: Slow rate;Small sips/bites;Follow solids with liquid Postural Changes: Seated upright at 90 degrees;Remain upright for at least 30 minutes after po intake    Other  Recommendations Oral Care Recommendations: Oral care QID;Oral care prior to ice chip/H20 Other Recommendations: Have oral suction available   Follow up Recommendations  (tba)      Frequency and Duration min 2x/week  2 weeks       Prognosis Prognosis for Safe Diet Advancement: Good Barriers to Reach Goals: Cognitive deficits      Swallow Study   General HPI: Ptis an 82 y.o.male presenting with AMS and found to have sepsis from UTI. PMH includes dementia, HTN, DM, seizures, PNA, dehydration, and depression. Pt has had swallow studies in the past, including MBS in 2015 that recommended Dys 3 diet and thin liquids. Since then he has had three clinical evaluations, and although he has been able to stay on thin liquids, his most recent diet recommendation was for Dys 1 textures due in large part to his cognitive status. Type of Study: Bedside Swallow Evaluation Previous Swallow Assessment: see HPI Diet Prior to this Study: NPO Temperature Spikes Noted: Yes (100.4) Respiratory Status: Room  air History of Recent Intubation: No Behavior/Cognition: Alert;Cooperative;Requires cueing Oral Cavity Assessment:  (difficult to assess - limited mouth opening to command) Oral Cavity - Dentition: Poor condition;Missing dentition (from what can be visualized) Self-Feeding Abilities: Total assist Patient Positioning: Upright in bed;Other (comment) (pt  tends to shift to his L) Baseline Vocal Quality: Normal    Oral/Motor/Sensory Function     Ice Chips Ice chips: Not tested   Thin Liquid Thin Liquid: Impaired Presentation: Straw Oral Phase Functional Implications: Oral residue Pharyngeal  Phase Impairments: Multiple swallows;Change in Vital Signs    Nectar Thick Nectar Thick Liquid: Not tested   Honey Thick Honey Thick Liquid: Not tested   Puree Puree: Impaired Presentation: Spoon Oral Phase Functional Implications: Oral holding;Oral residue Pharyngeal Phase Impairments: Change in Vital Signs   Solid   GO   Solid: Not tested        Eric Cameron, Eric Cameron 02/03/2017,11:23 AM  Eric Cameron, M.A. CCC-SLP [phone]

## 2017-02-03 NOTE — Care Management Note (Addendum)
Case Management Note  Patient Details  Name: Eric Cameron MRN: [id number] Date of Birth: 1934-07-18  Subjective/Objective:   From home with his sisters, presents with servere sepsis and uti, has a BKA.  Awaiting pt eval.   10/22 1246 Letha Capeeborah Kyrus Hyde RN, BSN - per floor RN, sisters want everything done until they come back on Tues or Wed and make a decision.  Pt eval rec SNF.  Plan per palliative note will be to SNF with palliative or home with hospice with sister.                    Action/Plan: NCM will follow for dc needs.   Expected Discharge Date:                  Expected Discharge Plan:     In-House Referral:     Discharge planning Services  CM Consult  Post Acute Care Choice:    Choice offered to:     DME Arranged:    DME Agency:     HH Arranged:    HH Agency:     Status of Service:  In process, will continue to follow  If discussed at Long Length of Stay Meetings, dates discussed:    Additional Comments:  Leone Havenaylor, Georgio Hattabaugh Clinton, RN 02/03/2017, 6:42 PM

## 2017-02-03 NOTE — Progress Notes (Signed)
Initial Nutrition Assessment  DOCUMENTATION CODES:   Severe malnutrition in context of chronic illness, Underweight  INTERVENTION:    PO diet advancement vs TF initiation. RD to follow for nutrition care plan.  NUTRITION DIAGNOSIS:   Malnutrition (severe) related to chronic illness (dementia, depression) as evidenced by severe depletion of body fat, severe depletion of muscle mass  GOAL:   Patient will meet greater than or equal to 90% of their needs  MONITOR:   PO intake, Supplement acceptance, Labs, Weight trends, Skin, I & O's  REASON FOR ASSESSMENT:   Low Braden  ASSESSMENT:   82 yo Male with history of seizures, dementia, hypertension, diabetes who presented to the hospital with altered mental status.  Patient has underlying dementia, and lives with his sisters, who tells me over the phone that over the last couple of days he has been more lethargic than normal.  He was found to be septic with a UTI and was admitted to the hospital broad-spectrum antibiotics.   Pt laying in a fetal position in bed upon RD visit.  His care is given by his two (2) elderly sisters with whom he lives. PO intake and the desire to eat had deteriorated prior to admission.  S/p bedside swallow evaluation. SLP rec NPO status at this time. Medications reviewed and include ABX and Lantus. Labs: Na 153 (H). CBG's [id number].  Low braden score places patient at risk for skin breakdown.  Nutrition-Focused physical exam completed. Findings are severe fat depletion, severe muscle depletion, and no edema.   Diet Order:  Diet NPO time specified Except for: Other (See Comments)  Skin:  Wound (see comment) (Stage I to R & L hip)  Last BM:  N/A  Height:   Ht Readings from Last 1 Encounters:  02/01/17 5\' 5"  (1.651 m)    Weight:   Wt Readings from Last 1 Encounters:  02/01/17 105 lb 6.1 oz (47.8 kg)    Ideal Body Weight:  56.8 kg  BMI:  Body mass index is 17.54 kg/m.  Estimated  Nutritional Needs:   Kcal:  1300-1500  Protein:  65-80 gm  Fluid:  >/= 1.5 L  EDUCATION NEEDS:   No education needs identified at this time  Eric ChattersKatie Lachrisha Cameron, RD, LDN Pager #: [phone] After-Hours Pager #: [phone]

## 2017-02-03 NOTE — Progress Notes (Signed)
PROGRESS NOTE  Eric Cameron ZOX:[id number] DOB: [birth year] DOA: 02/01/2017 PCP: No primary care provider on file.   LOS: 2 days   Brief Narrative / Interim history: 82-year-old male with history of seizures, dementia, hypertension, diabetes who presented to the hospital with altered mental status.  Patient has underlying dementia, and lives with his sisters, who tells me over the phone that over the last couple of days he has been more lethargic than normal.  He was found to be septic with a UTI and was admitted to the hospital broad-spectrum antibiotics.  Assessment & Plan: Principal Problem:   Severe sepsis (HCC) Active Problems:   HTN (hypertension)   Seizures (HCC)   Alzheimer's dementia without behavioral disturbance   UTI (urinary tract infection)   Acute renal failure (ARF) (HCC)   DKA (diabetic ketoacidosis) (HCC)   Sepsis due to urinary tract infection -Patient with tachycardia, tachypnea, elevated white count, elevated lactic acid and hypotension -Started on vancomycin and Zosyn on admission -Sepsis physiology improving however still spiked a temp overnight and his blood pressure is intermittently into the 80s and 90s. Remain in stepdown -Urine cultures showed yeast, start Diflucan -Discontinue vancomycin -will continue on Zosyn empirically for now for bacterial coverage  Hyperglycemic state / DM -Patient without anion gap, his bicarb is low but this is likely in the setting of renal failure, acute was placed on insulin infusion overnight when he was admitted, CBGs are now improved, will give Lantus 10 units and sliding scale every 4 hours, CBGs now better controlled into the low 100s -On metformin at home, CBGs elevated likely due to sepsis -Most recent A1c of 8.7 in February 2018, repeat A1c 7.9  NAGMA -In the setting of renal failure, change fluids to Ringer's lactate  Acute kidney injury -Patient with prior normal renal function, creatinine on admission 3.6  likely in the setting of sepsis -Continue fluids, improved to 1.5 today  Hypernatremia -Likely due to dehydration, fluids, changed to dextrose / water today  History of seizure disorder -He is being followed by neurology as an outpatient, does not appear to be on any AED right now  Dementia -Baseline is not clear to me, apparently he has a good appetite and is eating well however he has been having persistent confusion even at home -Failed speech eval today, reevaluate over the weekend, continue fluids for now  Hypertension -Currently septic, hold blood pressure meds   DVT prophylaxis: Lovenox Code Status: DNR (confirmed with the patient's sister/POA over the phone) Family Communication: Discussed with sister [place] Callas the phone Disposition Plan: Remain in stepdown  Consultants:   None  Procedures:   None   Antimicrobials:  Vancomycin 10/17 >> 10/19  Zosyn 10/17 >>   Diflucan 10/19 >>  Subjective: -In fetal position in bed, has no complaints for me but only answers "all right"  Objective: Vitals:   02/02/17 2300 02/03/17 0332 02/03/17 0800 02/03/17 1200  BP: (!) 111/92 (!) 103/59 (!) 93/51 (!) 106/58  Pulse: (!) 109 (!) 109 99 (!) 102  Resp: (!) 25 (!) 23 16 (!) 24  Temp: (!) 100.4 F (38 C) 100.2 F (37.9 C) 98.1 F (36.7 C) 98.1 F (36.7 C)  TempSrc:   Oral Oral  SpO2: 100% 100% 99% 100%  Weight:      Height:        Intake/Output Summary (Last 24 hours) at 02/03/17 1353 Last data filed at 02/03/17 1300  Gross per 24 hour  Intake  1425.59 ml  Output             3925 ml  Net         -2499.41 ml   Filed Weights   02/01/17 2300 02/01/17 2326  Weight: 47.8 kg (105 lb 6.1 oz) 47.8 kg (105 lb 6.1 oz)    Examination:  Constitutional: Cachectic appearing African-American male Eyes: No scleral icterus ENMT: Moist mucous membranes Respiratory: Clear to auscultation, no wheezing, no crackles Cardiovascular: Regular rate and rhythm, no  edema Abdomen: No tenderness, bowel sounds positive Neurologic: Grossly nonfocal, equal strength Psychiatric: Alert to self  Data Reviewed: I have independently reviewed following labs and imaging studies   CBC:  Recent Labs Lab 02/01/17 1640 02/01/17 2053 02/03/17 0729  WBC 27.3*  --  17.3*  NEUTROABS 24.5*  --   --   HGB 8.2* 9.9* 8.0*  HCT 26.6* 29.0* 26.1*  MCV 86.4  --  85.6  PLT 613*  --  537*   Basic Metabolic Panel:  Recent Labs Lab 02/02/17 0256 02/02/17 0722 02/02/17 1112 02/02/17 1458 02/03/17 0729  NA 148* 148* 148* 148* 153*  K 4.6 4.8 4.2 4.5 4.5  CL 126* 126* 125* 125* 128*  CO2 14* 11* 12* 13* 15*  GLUCOSE 145* 197* 139* 117* 123*  BUN 96* 89* 85* 83* 69*  CREATININE 2.04* 1.73* 1.71* 1.67* 1.51*  CALCIUM 10.2 10.3 10.1 10.0 10.3   GFR: Estimated Creatinine Clearance: 26.4 mL/min (A) (by C-G formula based on SCr of 1.51 mg/dL (H)). Liver Function Tests:  Recent Labs Lab 02/01/17 1640 02/03/17 0729  AST 17 25  ALT 19 19  ALKPHOS 81 63  BILITOT 0.3 0.5  PROT 9.0* 7.7  ALBUMIN 2.1* 1.8*   No results for input(s): LIPASE, AMYLASE in the last 168 hours. No results for input(s): AMMONIA in the last 168 hours. Coagulation Profile:  Recent Labs Lab 02/01/17 1640  INR 1.27   Cardiac Enzymes: No results for input(s): CKTOTAL, CKMB, CKMBINDEX, TROPONINI in the last 168 hours. BNP (last 3 results) No results for input(s): PROBNP in the last 8760 hours. HbA1C:  Recent Labs  02/02/17 1136  HGBA1C 7.9*   CBG:  Recent Labs Lab 02/02/17 1944 02/02/17 2318 02/03/17 0333 02/03/17 0817 02/03/17 1200  GLUCAP 151* 135* 110* 117* 143*   Lipid Profile: No results for input(s): CHOL, HDL, LDLCALC, TRIG, CHOLHDL, LDLDIRECT in the last 72 hours. Thyroid Function Tests: No results for input(s): TSH, T4TOTAL, FREET4, T3FREE, THYROIDAB in the last 72 hours. Anemia Panel: No results for input(s): VITAMINB12, FOLATE, FERRITIN, TIBC, IRON,  RETICCTPCT in the last 72 hours. Urine analysis:    Component Value Date/Time   COLORURINE YELLOW 02/01/2017 1728   APPEARANCEUR TURBID (A) 02/01/2017 1728   LABSPEC 1.013 02/01/2017 1728   PHURINE 5.0 02/01/2017 1728   GLUCOSEU NEGATIVE 02/01/2017 1728   HGBUR MODERATE (A) 02/01/2017 1728   BILIRUBINUR NEGATIVE 02/01/2017 1728   KETONESUR NEGATIVE 02/01/2017 1728   PROTEINUR 100 (A) 02/01/2017 1728   UROBILINOGEN 1.0 11/30/2013 1734   NITRITE NEGATIVE 02/01/2017 1728   LEUKOCYTESUR MODERATE (A) 02/01/2017 1728   Sepsis Labs: Invalid input(s): PROCALCITONIN, LACTICIDVEN  Recent Results (from the past 240 hour(s))  Culture, blood (Routine x 2)     Status: None (Preliminary result)   Collection Time: 02/01/17  4:40 PM  Result Value Ref Range Status   Specimen Description BLOOD RIGHT HAND  Final   Special Requests   Final    BOTTLES DRAWN AEROBIC  ONLY Blood Culture adequate volume   Culture NO GROWTH < 24 HOURS  Final   Report Status PENDING  Incomplete  Urine Culture     Status: Abnormal   Collection Time: 02/01/17  5:28 PM  Result Value Ref Range Status   Specimen Description URINE, CATHETERIZED  Final   Special Requests NONE  Final   Culture >=100,000 COLONIES/mL YEAST (A)  Final   Report Status 02/02/2017 FINAL  Final  Culture, blood (Routine x 2)     Status: Abnormal   Collection Time: 02/01/17  6:05 PM  Result Value Ref Range Status   Specimen Description BLOOD RIGHT ANTECUBITAL  Final   Special Requests IN PEDIATRIC BOTTLE Blood Culture adequate volume  Final   Culture  Setup Time   Final    GRAM POSITIVE COCCI IN CLUSTERS IN PEDIATRIC BOTTLE CRITICAL RESULT CALLED TO, READ BACK BY AND VERIFIED WITH: A JOHNSTON PHARMD 2239 02/02/17 A BROWNING    Culture (A)  Final    STAPHYLOCOCCUS SPECIES (COAGULASE NEGATIVE) THE SIGNIFICANCE OF ISOLATING THIS ORGANISM FROM A SINGLE SET OF BLOOD CULTURES WHEN MULTIPLE SETS ARE DRAWN IS UNCERTAIN. PLEASE NOTIFY THE MICROBIOLOGY  DEPARTMENT WITHIN ONE WEEK IF SPECIATION AND SENSITIVITIES ARE REQUIRED.    Report Status 02/03/2017 FINAL  Final  Blood Culture ID Panel (Reflexed)     Status: None   Collection Time: 02/01/17  6:05 PM  Result Value Ref Range Status   Enterococcus species NOT DETECTED NOT DETECTED Final   Listeria monocytogenes NOT DETECTED NOT DETECTED Final   Staphylococcus species NOT DETECTED NOT DETECTED Final   Staphylococcus aureus NOT DETECTED NOT DETECTED Final   Streptococcus species NOT DETECTED NOT DETECTED Final   Streptococcus agalactiae NOT DETECTED NOT DETECTED Final   Streptococcus pneumoniae NOT DETECTED NOT DETECTED Final   Streptococcus pyogenes NOT DETECTED NOT DETECTED Final   Acinetobacter baumannii NOT DETECTED NOT DETECTED Final   Enterobacteriaceae species NOT DETECTED NOT DETECTED Final   Enterobacter cloacae complex NOT DETECTED NOT DETECTED Final   Escherichia coli NOT DETECTED NOT DETECTED Final   Klebsiella oxytoca NOT DETECTED NOT DETECTED Final   Klebsiella pneumoniae NOT DETECTED NOT DETECTED Final   Proteus species NOT DETECTED NOT DETECTED Final   Serratia marcescens NOT DETECTED NOT DETECTED Final   Haemophilus influenzae NOT DETECTED NOT DETECTED Final   Neisseria meningitidis NOT DETECTED NOT DETECTED Final   Pseudomonas aeruginosa NOT DETECTED NOT DETECTED Final   Candida albicans NOT DETECTED NOT DETECTED Final   Candida glabrata NOT DETECTED NOT DETECTED Final   Candida krusei NOT DETECTED NOT DETECTED Final   Candida parapsilosis NOT DETECTED NOT DETECTED Final   Candida tropicalis NOT DETECTED NOT DETECTED Final  MRSA PCR Screening     Status: None   Collection Time: 02/01/17 11:15 PM  Result Value Ref Range Status   MRSA by PCR NEGATIVE NEGATIVE Final    Comment:        The GeneXpert MRSA Assay (FDA approved for NASAL specimens only), is one component of a comprehensive MRSA colonization surveillance program. It is not intended to diagnose  MRSA infection nor to guide or monitor treatment for MRSA infections.      Radiology Studies: Dg Chest Port 1 View  Result Date: 02/01/2017 CLINICAL DATA:  Altered mental status, question sepsis. EXAM: PORTABLE CHEST 1 VIEW COMPARISON:  06/08/2016 FINDINGS: The heart size and mediastinal contours are within normal limits. There is minimal atelectasis at the left lung base. There is mild uncoiling of  the thoracic aorta, similar in appearance to prior. No acute nor suspicious osseous abnormalities. High-riding humeral heads are noted bilaterally which may reflect chronic rotator cuff tears. IMPRESSION: No active disease. Electronically Signed   By: Tollie Eth M.D.   On: 02/01/2017 19:01    Scheduled Meds: . enoxaparin (LOVENOX) injection  30 mg Subcutaneous Daily  . insulin aspart  0-9 Units Subcutaneous Q4H  . insulin glargine  10 Units Subcutaneous Daily  . mouth rinse  15 mL Mouth Rinse BID   Continuous Infusions: . dextrose 75 mL/hr at 02/03/17 1205  . fluconazole (DIFLUCAN) IV Stopped (02/03/17 0947)  . piperacillin-tazobactam (ZOSYN)  IV 3.375 g (02/03/17 0847)  . vancomycin Stopped (02/03/17 0617)     Pamella Pert, MD, PhD [facility] Pager [phone]  If 7PM-7AM, please contact night-coverage [email or web address] Password [email or web address] 02/03/2017, 1:53 PM

## 2017-02-04 DIAGNOSIS — E43 Unspecified severe protein-calorie malnutrition: Secondary | ICD-10-CM | POA: Insufficient documentation

## 2017-02-04 LAB — CBC
HEMATOCRIT: 25.3 % — AB (ref 39.0–52.0)
Hemoglobin: 7.8 g/dL — ABNORMAL LOW (ref 13.0–17.0)
MCH: 26.5 pg (ref 26.0–34.0)
MCHC: 30.8 g/dL (ref 30.0–36.0)
MCV: 86.1 fL (ref 78.0–100.0)
Platelets: 457 10*3/uL — ABNORMAL HIGH (ref 150–400)
RBC: 2.94 MIL/uL — ABNORMAL LOW (ref 4.22–5.81)
RDW: 15.8 % — AB (ref 11.5–15.5)
WBC: 12.8 10*3/uL — ABNORMAL HIGH (ref 4.0–10.5)

## 2017-02-04 LAB — GLUCOSE, CAPILLARY
GLUCOSE-CAPILLARY: 112 mg/dL — AB (ref 65–99)
GLUCOSE-CAPILLARY: 121 mg/dL — AB (ref 65–99)
GLUCOSE-CAPILLARY: 131 mg/dL — AB (ref 65–99)
Glucose-Capillary: 108 mg/dL — ABNORMAL HIGH (ref 65–99)
Glucose-Capillary: 112 mg/dL — ABNORMAL HIGH (ref 65–99)

## 2017-02-04 LAB — BASIC METABOLIC PANEL
Anion gap: 8 (ref 5–15)
BUN: 60 mg/dL — AB (ref 6–20)
CALCIUM: 9.6 mg/dL (ref 8.9–10.3)
CO2: 16 mmol/L — ABNORMAL LOW (ref 22–32)
Chloride: 125 mmol/L — ABNORMAL HIGH (ref 101–111)
Creatinine, Ser: 1.56 mg/dL — ABNORMAL HIGH (ref 0.61–1.24)
GFR calc Af Amer: 47 mL/min — ABNORMAL LOW (ref >60)
GFR, EST NON AFRICAN AMERICAN: 40 mL/min — AB (ref >60)
GLUCOSE: 106 mg/dL — AB (ref 65–99)
Potassium: 3.5 mmol/L (ref 3.5–5.1)
Sodium: 149 mmol/L — ABNORMAL HIGH (ref 135–145)

## 2017-02-04 MED ORDER — HYDROCORTISONE NA SUCCINATE PF 100 MG IJ SOLR
50.0000 mg | Freq: Two times a day (BID) | INTRAMUSCULAR | Status: DC
  Administered 2017-02-04 – 2017-02-07 (×6): 50 mg via INTRAVENOUS
  Filled 2017-02-04 (×6): qty 2

## 2017-02-04 MED ORDER — SODIUM CHLORIDE 0.9 % IV BOLUS (SEPSIS)
500.0000 mL | Freq: Once | INTRAVENOUS | Status: AC
  Administered 2017-02-04: 500 mL via INTRAVENOUS

## 2017-02-04 MED ORDER — SODIUM CHLORIDE 0.9 % IV BOLUS (SEPSIS)
250.0000 mL | Freq: Once | INTRAVENOUS | Status: AC
  Administered 2017-02-04: 250 mL via INTRAVENOUS

## 2017-02-04 MED ORDER — MORPHINE SULFATE (PF) 4 MG/ML IV SOLN
1.0000 mg | Freq: Once | INTRAVENOUS | Status: AC
  Administered 2017-02-04: 1 mg via INTRAVENOUS
  Filled 2017-02-04: qty 1

## 2017-02-04 NOTE — Progress Notes (Addendum)
PROGRESS NOTE  Gael Londo IRW:[id number] DOB: Oct 18, 1934 DOA: 02/01/2017 PCP: No primary care provider on file.   LOS: 3 days   Brief Narrative / Interim history: 82-year-old male with history of seizures, dementia, hypertension, diabetes who presented to the hospital with altered mental status.  Patient has underlying dementia, and lives with his sisters, who tells me over the phone that over the last couple of days he has been more lethargic than normal.  He was found to be septic with a UTI and was admitted to the hospital broad-spectrum antibiotics.  Assessment & Plan: Principal Problem:   Severe sepsis (HCC) Active Problems:   HTN (hypertension)   Seizures (HCC)   Alzheimer's dementia without behavioral disturbance   UTI (urinary tract infection)   Acute renal failure (ARF) (HCC)   DKA (diabetic ketoacidosis) (HCC)   Protein-calorie malnutrition, severe   Sepsis due to urinary tract infection -Patient with tachycardia, tachypnea, elevated white count, elevated lactic acid and hypotension -Started on vancomycin and Zosyn on admission -Sepsis physiology improving however still spiked a temp overnight and his blood pressure is intermittently into the 80s and 90s. Remain in stepdown -Urine cultures showed yeast, start Diflucan -Discontinued vancomycin -will continue on Zosyn empirically for now for bacterial coverage  Patient with extreme deconditioning, severe malnutrition and cachexia.  He does does not seem to respond to broad-spectrum antibiotics and antifungals.  Palliative care consulted, I have confirmed DNR status with the sister.  Hyperglycemic state / DM -Patient without anion gap, his bicarb is low but this is likely in the setting of renal failure, acute was placed on insulin infusion overnight when he was admitted, CBGs are now improved, will give Lantus 10 units and sliding scale every 4 hours, CBGs now better controlled into the low 100s -On metformin at home,  CBGs elevated likely due to sepsis -Most recent A1c of 8.7 in February 2018, repeat A1c 7.9  NAGMA -In the setting of renal failure, bicarb improving  Acute kidney injury -Patient with prior normal renal function, creatinine on admission 3.6 likely in the setting of sepsis -Continue fluids, creatinine stable  Hypernatremia -Likely due to dehydration, fluids, changed to dextrose / water, sodium improving  History of seizure disorder -He is being followed by neurology as an outpatient, does not appear to be on any AED right now  Dementia -Baseline is not clear to me, apparently he has a good appetite and is eating well however he has been having persistent confusion even at home -Failed speech eval today, reevaluate over the weekend, continue fluids for now  Hypertension -Currently septic, hold blood pressure meds   DVT prophylaxis: Lovenox Code Status: DNR (confirmed with the patient's sister/POA over the phone).   Family Communication: Discussed with sister [place] Callas the phone. Unable to reach her on 10/20 Disposition Plan: Remain in stepdown  Consultants:   Palliative care  Procedures:   None   Antimicrobials:  Vancomycin 10/17 >> 10/19  Zosyn 10/17 >>   Diflucan 10/19 >>  Subjective: -Appears asymptomatic, and fetal position in bed  Objective: Vitals:   02/04/17 0018 02/04/17 0300 02/04/17 0600 02/04/17 1201  BP: 129/77 (!) 144/128 (!) 83/53   Pulse: (!) 118 (!) 119    Resp: 18 (!) 27    Temp: (!) 100.6 F (38.1 C) 100 F (37.8 C) 99.7 F (37.6 C) 99 F (37.2 C)  TempSrc: Other (Comment)  Core (Comment) Oral  SpO2: 94%     Weight:      Height:  Intake/Output Summary (Last 24 hours) at 02/04/17 1348 Last data filed at 02/04/17 1201  Gross per 24 hour  Intake          1459.25 ml  Output             1661 ml  Net          -201.75 ml   Filed Weights   02/01/17 2300 02/01/17 2326  Weight: 47.8 kg (105 lb 6.1 oz) 47.8 kg (105 lb 6.1 oz)     Examination:  Constitutional: Cachectic appearing Eyes: No icterus ENMT: Moist mucous membranes Respiratory: Clear to auscultation, no wheezing Cardiovascular: Regular rate, no edema Abdomen: Bowel sounds positive Neurologic: Grossly nonfocal Psychiatric: Alert to self, however lethargic today  Data Reviewed: I have independently reviewed following labs and imaging studies   CBC:  Recent Labs Lab 02/01/17 1640 02/01/17 2053 02/03/17 0729 02/04/17 0551  WBC 27.3*  --  17.3* 12.8*  NEUTROABS 24.5*  --   --   --   HGB 8.2* 9.9* 8.0* 7.8*  HCT 26.6* 29.0* 26.1* 25.3*  MCV 86.4  --  85.6 86.1  PLT 613*  --  537* 457*   Basic Metabolic Panel:  Recent Labs Lab 02/02/17 0722 02/02/17 1112 02/02/17 1458 02/03/17 0729 02/04/17 0551  NA 148* 148* 148* 153* 149*  K 4.8 4.2 4.5 4.5 3.5  CL 126* 125* 125* 128* 125*  CO2 11* 12* 13* 15* 16*  GLUCOSE 197* 139* 117* 123* 106*  BUN 89* 85* 83* 69* 60*  CREATININE 1.73* 1.71* 1.67* 1.51* 1.56*  CALCIUM 10.3 10.1 10.0 10.3 9.6   GFR: Estimated Creatinine Clearance: 25.5 mL/min (A) (by C-G formula based on SCr of 1.56 mg/dL (H)). Liver Function Tests:  Recent Labs Lab 02/01/17 1640 02/03/17 0729  AST 17 25  ALT 19 19  ALKPHOS 81 63  BILITOT 0.3 0.5  PROT 9.0* 7.7  ALBUMIN 2.1* 1.8*   No results for input(s): LIPASE, AMYLASE in the last 168 hours. No results for input(s): AMMONIA in the last 168 hours. Coagulation Profile:  Recent Labs Lab 02/01/17 1640  INR 1.27   Cardiac Enzymes: No results for input(s): CKTOTAL, CKMB, CKMBINDEX, TROPONINI in the last 168 hours. BNP (last 3 results) No results for input(s): PROBNP in the last 8760 hours. HbA1C:  Recent Labs  02/02/17 1136  HGBA1C 7.9*   CBG:  Recent Labs Lab 02/03/17 1936 02/03/17 2349 02/04/17 0337 02/04/17 0819 02/04/17 1157  GLUCAP 171* 102* 112* 121* 108*   Lipid Profile: No results for input(s): CHOL, HDL, LDLCALC, TRIG, CHOLHDL,  LDLDIRECT in the last 72 hours. Thyroid Function Tests: No results for input(s): TSH, T4TOTAL, FREET4, T3FREE, THYROIDAB in the last 72 hours. Anemia Panel: No results for input(s): VITAMINB12, FOLATE, FERRITIN, TIBC, IRON, RETICCTPCT in the last 72 hours. Urine analysis:    Component Value Date/Time   COLORURINE YELLOW 02/01/2017 1728   APPEARANCEUR TURBID (A) 02/01/2017 1728   LABSPEC 1.013 02/01/2017 1728   PHURINE 5.0 02/01/2017 1728   GLUCOSEU NEGATIVE 02/01/2017 1728   HGBUR MODERATE (A) 02/01/2017 1728   BILIRUBINUR NEGATIVE 02/01/2017 1728   KETONESUR NEGATIVE 02/01/2017 1728   PROTEINUR 100 (A) 02/01/2017 1728   UROBILINOGEN 1.0 11/30/2013 1734   NITRITE NEGATIVE 02/01/2017 1728   LEUKOCYTESUR MODERATE (A) 02/01/2017 1728   Sepsis Labs: Invalid input(s): PROCALCITONIN, LACTICIDVEN  Recent Results (from the past 240 hour(s))  Culture, blood (Routine x 2)     Status: None (Preliminary result)   Collection  Time: 02/01/17  4:40 PM  Result Value Ref Range Status   Specimen Description BLOOD RIGHT HAND  Final   Special Requests   Final    BOTTLES DRAWN AEROBIC ONLY Blood Culture adequate volume   Culture NO GROWTH 3 DAYS  Final   Report Status PENDING  Incomplete  Urine Culture     Status: Abnormal   Collection Time: 02/01/17  5:28 PM  Result Value Ref Range Status   Specimen Description URINE, CATHETERIZED  Final   Special Requests NONE  Final   Culture >=100,000 COLONIES/mL YEAST (A)  Final   Report Status 02/02/2017 FINAL  Final  Culture, blood (Routine x 2)     Status: Abnormal   Collection Time: 02/01/17  6:05 PM  Result Value Ref Range Status   Specimen Description BLOOD RIGHT ANTECUBITAL  Final   Special Requests IN PEDIATRIC BOTTLE Blood Culture adequate volume  Final   Culture  Setup Time   Final    GRAM POSITIVE COCCI IN CLUSTERS IN PEDIATRIC BOTTLE CRITICAL RESULT CALLED TO, READ BACK BY AND VERIFIED WITH: A JOHNSTON PHARMD 2239 02/02/17 A BROWNING     Culture (A)  Final    STAPHYLOCOCCUS SPECIES (COAGULASE NEGATIVE) THE SIGNIFICANCE OF ISOLATING THIS ORGANISM FROM A SINGLE SET OF BLOOD CULTURES WHEN MULTIPLE SETS ARE DRAWN IS UNCERTAIN. PLEASE NOTIFY THE MICROBIOLOGY DEPARTMENT WITHIN ONE WEEK IF SPECIATION AND SENSITIVITIES ARE REQUIRED.    Report Status 02/03/2017 FINAL  Final  Blood Culture ID Panel (Reflexed)     Status: None   Collection Time: 02/01/17  6:05 PM  Result Value Ref Range Status   Enterococcus species NOT DETECTED NOT DETECTED Final   Listeria monocytogenes NOT DETECTED NOT DETECTED Final   Staphylococcus species NOT DETECTED NOT DETECTED Final   Staphylococcus aureus NOT DETECTED NOT DETECTED Final   Streptococcus species NOT DETECTED NOT DETECTED Final   Streptococcus agalactiae NOT DETECTED NOT DETECTED Final   Streptococcus pneumoniae NOT DETECTED NOT DETECTED Final   Streptococcus pyogenes NOT DETECTED NOT DETECTED Final   Acinetobacter baumannii NOT DETECTED NOT DETECTED Final   Enterobacteriaceae species NOT DETECTED NOT DETECTED Final   Enterobacter cloacae complex NOT DETECTED NOT DETECTED Final   Escherichia coli NOT DETECTED NOT DETECTED Final   Klebsiella oxytoca NOT DETECTED NOT DETECTED Final   Klebsiella pneumoniae NOT DETECTED NOT DETECTED Final   Proteus species NOT DETECTED NOT DETECTED Final   Serratia marcescens NOT DETECTED NOT DETECTED Final   Haemophilus influenzae NOT DETECTED NOT DETECTED Final   Neisseria meningitidis NOT DETECTED NOT DETECTED Final   Pseudomonas aeruginosa NOT DETECTED NOT DETECTED Final   Candida albicans NOT DETECTED NOT DETECTED Final   Candida glabrata NOT DETECTED NOT DETECTED Final   Candida krusei NOT DETECTED NOT DETECTED Final   Candida parapsilosis NOT DETECTED NOT DETECTED Final   Candida tropicalis NOT DETECTED NOT DETECTED Final  MRSA PCR Screening     Status: None   Collection Time: 02/01/17 11:15 PM  Result Value Ref Range Status   MRSA by PCR NEGATIVE  NEGATIVE Final    Comment:        The GeneXpert MRSA Assay (FDA approved for NASAL specimens only), is one component of a comprehensive MRSA colonization surveillance program. It is not intended to diagnose MRSA infection nor to guide or monitor treatment for MRSA infections.      Radiology Studies: No results found.  Scheduled Meds: . enoxaparin (LOVENOX) injection  30 mg Subcutaneous Daily  . insulin  aspart  0-9 Units Subcutaneous Q4H  . insulin glargine  10 Units Subcutaneous Daily  . mouth rinse  15 mL Mouth Rinse BID   Continuous Infusions: . dextrose 75 mL/hr at 02/04/17 1313  . fluconazole (DIFLUCAN) IV Stopped (02/04/17 1009)  . piperacillin-tazobactam (ZOSYN)  IV Stopped (02/04/17 1241)  . sodium chloride       Pamella Pertostin Gherghe, MD, PhD [facility] Pager [phone]  If 7PM-7AM, please contact night-coverage [email or web address] Password [email or web address] 02/04/2017, 1:48 PM

## 2017-02-04 NOTE — Progress Notes (Signed)
SLP Cancellation Note  Patient Details Name: Cornelia CopaRobert Bogen MRN: [id number] DOB: [birth year]   Cancelled treatment:       Reason Eval/Treat Not Completed: Medical issues which prohibited therapy. Will hold therapy at request of RN; pt minimally responsive today.  Rondel BatonMary Beth Caprice Wasko, [place]MS, CCC-SLP Speech-Language Pathologist [phone]   Arlana LindauMary E River Mckercher 02/04/2017, 4:56 PM

## 2017-02-04 NOTE — Progress Notes (Signed)
PT Cancellation Note  Patient Details Name: Eric Cameron MRN: [id number] DOB: 1934/04/24   Cancelled Treatment:    Reason Eval/Treat Not Completed: Medical issues which prohibited therapy (Nsg asked PT to HOLD due to palliative care c/s. Pt low BP.)   Berline LopesDawn F Sesilia Poucher 02/04/2017, 1:27 PM  Wynee Matarazzo,PT Acute Rehabilitation [phone] (pager)

## 2017-02-04 NOTE — Progress Notes (Signed)
At request of Dr. Lafe GarinGherge [phone] removed from contact info for sister as it is wrong #.

## 2017-02-04 NOTE — Progress Notes (Signed)
Dr. Wyonia HoughGerghe in to see pt.  New IVF hung and foley removed.  MD aware of pt low BP.  States is going to order steroids for pt.  Tried calling pt sister again but unable to get an answer.

## 2017-02-04 NOTE — Progress Notes (Signed)
Pts HR sustaining 120s-130s. No PRNs available at this time--on-call provider notified. Received order for 500mL NS bolus. Will continue to monitor.  Also notified NP of bloody discharge from foley.  Caswell Corwinana C Gerlean Cid, RN 02/04/17 4:00 AM

## 2017-02-04 NOTE — Progress Notes (Addendum)
   02/04/17 0600  Vitals  Temp 99.7 F (37.6 C)  Temp Source Core  BP (!) 83/53  MAP (mmHg) 65  BP Location Left Leg  BP Method Automatic  Patient Position (if appropriate) Lying  ECG Heart Rate 97   On-call NP notified of blood pressure. Awaiting new orders. Will continue to monitor. Caswell Corwinana C Kinisha Soper, RN 02/04/17 6:34 AM  Orders received for 250mL NS bolus. 6:44 AM

## 2017-02-04 NOTE — Plan of Care (Signed)
Problem: Fluid Volume: Goal: Ability to maintain a balanced intake and output will improve Outcome: Progressing Pt is able to tolerate IV fluids and is maintaining adequate urine output during the shift.  Problem: Bowel/Gastric: Goal: Will not experience complications related to bowel motility Outcome: Progressing Pt had a medium formed bowel movement during the shift.

## 2017-02-05 DIAGNOSIS — L8990 Pressure ulcer of unspecified site, unspecified stage: Secondary | ICD-10-CM | POA: Insufficient documentation

## 2017-02-05 DIAGNOSIS — Z515 Encounter for palliative care: Secondary | ICD-10-CM

## 2017-02-05 DIAGNOSIS — G301 Alzheimer's disease with late onset: Secondary | ICD-10-CM

## 2017-02-05 DIAGNOSIS — Z7189 Other specified counseling: Secondary | ICD-10-CM

## 2017-02-05 DIAGNOSIS — F0280 Dementia in other diseases classified elsewhere without behavioral disturbance: Secondary | ICD-10-CM

## 2017-02-05 DIAGNOSIS — A419 Sepsis, unspecified organism: Secondary | ICD-10-CM

## 2017-02-05 DIAGNOSIS — R6520 Severe sepsis without septic shock: Secondary | ICD-10-CM

## 2017-02-05 LAB — GLUCOSE, CAPILLARY
GLUCOSE-CAPILLARY: 178 mg/dL — AB (ref 65–99)
GLUCOSE-CAPILLARY: 220 mg/dL — AB (ref 65–99)
GLUCOSE-CAPILLARY: 239 mg/dL — AB (ref 65–99)
GLUCOSE-CAPILLARY: 198 mg/dL — AB (ref 65–99)
GLUCOSE-CAPILLARY: 222 mg/dL — AB (ref 65–99)
Glucose-Capillary: 157 mg/dL — ABNORMAL HIGH (ref 65–99)
Glucose-Capillary: 205 mg/dL — ABNORMAL HIGH (ref 65–99)
Glucose-Capillary: 240 mg/dL — ABNORMAL HIGH (ref 65–99)

## 2017-02-05 MED ORDER — MORPHINE SULFATE (CONCENTRATE) 10 MG/0.5ML PO SOLN: 2.6000 mg | ORAL | Status: DC | PRN

## 2017-02-05 NOTE — Progress Notes (Signed)
  Speech Language Pathology Treatment: Dysphagia  Patient Details Name: Eric CopaRobert Cameron MRN: [id number] DOB: [birth year] Today's Date: 02/05/2017 Time: [id number] SLP Time Calculation (min) (ACUTE ONLY): 15 min  Assessment / Plan / Recommendation Clinical Impression  Dysphagia treatment provided for PO trials. Pt lethargic, needed cues prior to each bolus to wake up; eyes remained closed but pt did answer yes/ no questions and followed some simple directions. Pt tolerated puree/ thin liquids by straw without overt s/s of aspiration; vocal quality remained clear. Mildly prolonged oral phase with puree boluses but no pocketing or oral holding was observed. Recommend initiating dysphagia 1 diet/ thin liquids, meds crushed in puree, full supervision as pt is total assist for feeding- ensure pt seated upright and following commands, check to ensure pt swallowed before providing more food or liquid. Given cognitive status, pt will continue to be at an increased risk of aspiration. SLP will continue to follow for diet tolerance; prognosis for advancement is guarded at this time given pt's cognitive status and overall prognosis.    HPI HPI: Ptis an 82 y.o.male presenting with AMS and found to have sepsis from UTI. PMH includes dementia, HTN, DM, seizures, PNA, dehydration, and depression. Pt has had swallow studies in the past, including MBS in 2015 that recommended Dys 3 diet and thin liquids. Since then he has had three clinical evaluations, and although he has been able to stay on thin liquids, his most recent diet recommendation was for Dys 1 textures due in large part to his cognitive status.      SLP Plan  Goals updated       Recommendations  Diet recommendations: Dysphagia 1 (puree);Thin liquid Liquids provided via: Straw Medication Administration: Crushed with puree Supervision: Staff to assist with self feeding;Full supervision/cueing for compensatory strategies Compensations: Slow  rate;Small sips/bites;Lingual sweep for clearance of pocketing Postural Changes and/or Swallow Maneuvers: Seated upright 90 degrees                Oral Care Recommendations: Oral care BID Follow up Recommendations: Skilled Nursing facility SLP Visit Diagnosis: Dysphagia, unspecified (R13.10) Plan: Goals updated       GO                Metro KungAmy K Oleksiak, MA, CCC-SLP 02/05/2017, 1:22 PM  [phone]

## 2017-02-05 NOTE — Progress Notes (Signed)
PROGRESS NOTE  Eric Cameron ZOX:[id number] DOB: 06/18/1934 DOA: 02/01/2017 PCP: No primary care provider on file.  HPI/Recap of past 24 hours: Mr. Eric Cameron is a 82 year old male with medical history significant for dementia, type 2 diabetes, CVA who presented on 02/01/2017 with weakness and AMS per family report.  On arrival to the ED, glucose greater than 500, lactic acid, bicarb 13, creatinine 3, BUN 115, anion gap 8, Cl 123, sodium 146.  Found to have greater than 100,000 colonies on urine for moderate, few bacteria culture, no ketones. Chest x-ray on admission showed no pulmonary abnormalities.   Patient was admitted for sepsis UTI 2/2 yeast. Also treated for presumed DKA likely secondary to infection and acute renal failure in setting of dehydration( poor PO intake and DKA) on admission.  Initially empirically started on (given ceftriaxone in ED) vancomycin and ceftriaxone given profound sepsis plus lactic acidosis and hypotension.  Urine culture showed yeast, Diflucan started 10/19. Zosyn continued for bacterial coverage given persistent fever, tachycardia and hypotension.  This morning able to have a small conversation with him.  Able to state his sister's name. Mostly answering yes or nodding his head to questions I ask. Denies any chest or abdominal pain.   Assessment/Plan: Principal Problem:   Severe sepsis (HCC) Active Problems:   HTN (hypertension)   Seizures (HCC)   Alzheimer's dementia without behavioral disturbance   UTI (urinary tract infection)   Acute renal failure (ARF) (HCC)   DKA (diabetic ketoacidosis) (HCC)   Protein-calorie malnutrition, severe   Palliative care encounter   Goals of care, counseling/discussion   Pressure injury of skin  Severe Sepsis secondary to UTI, etiology yeast, sepsis improving Septic Shock, improving - BP improving on IV solumedrol ( lactic acidosis normalized) - remained afebrile for 24 hours, leukocytosis improving - Continue  Diflucan and Zosyn  T2DM, poorly controlled hyperglycemia Initial concern for DKA on admission (glucose >500, bicarb 13, no urine ketones). However non anion gap hyperchloremic metabolic acidosis likely related to acute renal failure -hyperglycemia improved with brief insulin gtt - now maintaining on lantus 10  and SSI  Acute renal failure, multifactorial etiology, likely pre-renal ( poor PO intake and sepsis) Non anion gap metabolic acidosis, improving Admission Cr 3. Baseline 0.8-0.9. Currently 1.56 Co2 currently 16 (improved from 12) Improving with fluid resuscitation and antibiotics, -Monitor I/O, avoid nephrotoxins   Hypovolemic Hypernatremia, improving In setting of profound dehydration (poor po intake) and hypotension(severe sepsis) Currently 149 ( peak 153) -improved with fluid resuscitation, on D5% 75 cc - monitor BMP  Goals of Care Palliative Care team spoke with sister today who reiterated he is DNR/DNI with full scope treatment - I Will update sister, Outlook Callas on medical plan -symptomatic pain control ( hold for systolic blood pressure< 90) with PRN morphine    Alzheimer's Dementia, advanced Per sister baseline prior to hospitalization was living with his sister. Able to walk and eat  Per chart review patient has not been on Aricept/donepeizil before ( unclear if unknown contraindication) -last evaluated by neurology 03/2016 deemed to have substantial cognitive loss   Stage 1 Pressure Ulcer, present on admission Hip   Code Status: DNR/DNI  Family Communication: Will call sister,Eric Cameron to update  Disposition Plan: continue goals of care with family, continue antibiotics   Consultants:  Palliative Care  Procedures:  None  Antimicrobials:  Vancomycin 10/18-10/19  Zosyn 10/17-Current  Diflucan 10/19-Current   DVT prophylaxis:  Enoxaparin   Objective: Vitals:   02/05/17 0043 02/05/17 0300 02/05/17  0900 02/05/17 1100  BP:  (!)  89/59 (!) 88/46   Pulse:  73 (!) 58   Resp:  19 19   Temp: 97.7 F (36.5 C) 97.8 F (36.6 C) (!) 97.4 F (36.3 C) (!) 96.5 F (35.8 C)  TempSrc: Axillary  Oral Axillary  SpO2:  99% 100%   Weight:      Height:        Intake/Output Summary (Last 24 hours) at 02/05/17 1331 Last data filed at 02/05/17 1059  Gross per 24 hour  Intake          2773.75 ml  Output             1700 ml  Net          1073.75 ml   Filed Weights   02/01/17 2300 02/01/17 2326  Weight: 47.8 kg (105 lb 6.1 oz) 47.8 kg (105 lb 6.1 oz)    Exam:   General:  Lying in bed in fetal position, in obvious distress, thin, very cachetic older gentleman  Cardiovascular: difficult to assess given his position ( has his arms crossed over his chest and will not move them)   Respiratory: no wheezes, rhonchi, or rales on exam, on room air   Musculoskeletal: limited range of motion ( unclear if from effort)  Neurologic: Follows some commands ( gave thumbs up with right hand when asked, wiggled toes of both feet when asked), oriented to person, place ( named hospital), time ( only season, not able to name month or year)  Skin: dry, intact     Data Reviewed: CBC:  Recent Labs Lab 02/01/17 1640 02/01/17 2053 02/03/17 0729 02/04/17 0551  WBC 27.3*  --  17.3* 12.8*  NEUTROABS 24.5*  --   --   --   HGB 8.2* 9.9* 8.0* 7.8*  HCT 26.6* 29.0* 26.1* 25.3*  MCV 86.4  --  85.6 86.1  PLT 613*  --  537* 457*   Basic Metabolic Panel:  Recent Labs Lab 02/02/17 0722 02/02/17 1112 02/02/17 1458 02/03/17 0729 02/04/17 0551  NA 148* 148* 148* 153* 149*  K 4.8 4.2 4.5 4.5 3.5  CL 126* 125* 125* 128* 125*  CO2 11* 12* 13* 15* 16*  GLUCOSE 197* 139* 117* 123* 106*  BUN 89* 85* 83* 69* 60*  CREATININE 1.73* 1.71* 1.67* 1.51* 1.56*  CALCIUM 10.3 10.1 10.0 10.3 9.6   GFR: Estimated Creatinine Clearance: 25.5 mL/min (A) (by C-G formula based on SCr of 1.56 mg/dL (H)). Liver Function Tests:  Recent Labs Lab  02/01/17 1640 02/03/17 0729  AST 17 25  ALT 19 19  ALKPHOS 81 63  BILITOT 0.3 0.5  PROT 9.0* 7.7  ALBUMIN 2.1* 1.8*   No results for input(s): LIPASE, AMYLASE in the last 168 hours. No results for input(s): AMMONIA in the last 168 hours. Coagulation Profile:  Recent Labs Lab 02/01/17 1640  INR 1.27   Cardiac Enzymes: No results for input(s): CKTOTAL, CKMB, CKMBINDEX, TROPONINI in the last 168 hours. BNP (last 3 results) No results for input(s): PROBNP in the last 8760 hours. HbA1C: No results for input(s): HGBA1C in the last 72 hours. CBG:  Recent Labs Lab 02/04/17 1549 02/04/17 2002 02/05/17 0411 02/05/17 0920 02/05/17 1003  GLUCAP 112* 131* 178* 220* 239*   Lipid Profile: No results for input(s): CHOL, HDL, LDLCALC, TRIG, CHOLHDL, LDLDIRECT in the last 72 hours. Thyroid Function Tests: No results for input(s): TSH, T4TOTAL, FREET4, T3FREE, THYROIDAB in the last 72 hours. Anemia  Panel: No results for input(s): VITAMINB12, FOLATE, FERRITIN, TIBC, IRON, RETICCTPCT in the last 72 hours. Urine analysis:    Component Value Date/Time   COLORURINE YELLOW 02/01/2017 1728   APPEARANCEUR TURBID (A) 02/01/2017 1728   LABSPEC 1.013 02/01/2017 1728   PHURINE 5.0 02/01/2017 1728   GLUCOSEU NEGATIVE 02/01/2017 1728   HGBUR MODERATE (A) 02/01/2017 1728   BILIRUBINUR NEGATIVE 02/01/2017 1728   KETONESUR NEGATIVE 02/01/2017 1728   PROTEINUR 100 (A) 02/01/2017 1728   UROBILINOGEN 1.0 11/30/2013 1734   NITRITE NEGATIVE 02/01/2017 1728   LEUKOCYTESUR MODERATE (A) 02/01/2017 1728   Sepsis Labs: @LABRCNTIP (procalcitonin:4,lacticidven:4)  ) Recent Results (from the past 240 hour(s))  Culture, blood (Routine x 2)     Status: None (Preliminary result)   Collection Time: 02/01/17  4:40 PM  Result Value Ref Range Status   Specimen Description BLOOD RIGHT HAND  Final   Special Requests   Final    BOTTLES DRAWN AEROBIC ONLY Blood Culture adequate volume   Culture NO GROWTH 3  DAYS  Final   Report Status PENDING  Incomplete  Urine Culture     Status: Abnormal   Collection Time: 02/01/17  5:28 PM  Result Value Ref Range Status   Specimen Description URINE, CATHETERIZED  Final   Special Requests NONE  Final   Culture >=100,000 COLONIES/mL YEAST (A)  Final   Report Status 02/02/2017 FINAL  Final  Culture, blood (Routine x 2)     Status: Abnormal   Collection Time: 02/01/17  6:05 PM  Result Value Ref Range Status   Specimen Description BLOOD RIGHT ANTECUBITAL  Final   Special Requests IN PEDIATRIC BOTTLE Blood Culture adequate volume  Final   Culture  Setup Time   Final    GRAM POSITIVE COCCI IN CLUSTERS IN PEDIATRIC BOTTLE CRITICAL RESULT CALLED TO, READ BACK BY AND VERIFIED WITH: A JOHNSTON PHARMD 2239 02/02/17 A BROWNING    Culture (A)  Final    STAPHYLOCOCCUS SPECIES (COAGULASE NEGATIVE) THE SIGNIFICANCE OF ISOLATING THIS ORGANISM FROM A SINGLE SET OF BLOOD CULTURES WHEN MULTIPLE SETS ARE DRAWN IS UNCERTAIN. PLEASE NOTIFY THE MICROBIOLOGY DEPARTMENT WITHIN ONE WEEK IF SPECIATION AND SENSITIVITIES ARE REQUIRED.    Report Status 02/03/2017 FINAL  Final  Blood Culture ID Panel (Reflexed)     Status: None   Collection Time: 02/01/17  6:05 PM  Result Value Ref Range Status   Enterococcus species NOT DETECTED NOT DETECTED Final   Listeria monocytogenes NOT DETECTED NOT DETECTED Final   Staphylococcus species NOT DETECTED NOT DETECTED Final   Staphylococcus aureus NOT DETECTED NOT DETECTED Final   Streptococcus species NOT DETECTED NOT DETECTED Final   Streptococcus agalactiae NOT DETECTED NOT DETECTED Final   Streptococcus pneumoniae NOT DETECTED NOT DETECTED Final   Streptococcus pyogenes NOT DETECTED NOT DETECTED Final   Acinetobacter baumannii NOT DETECTED NOT DETECTED Final   Enterobacteriaceae species NOT DETECTED NOT DETECTED Final   Enterobacter cloacae complex NOT DETECTED NOT DETECTED Final   Escherichia coli NOT DETECTED NOT DETECTED Final    Klebsiella oxytoca NOT DETECTED NOT DETECTED Final   Klebsiella pneumoniae NOT DETECTED NOT DETECTED Final   Proteus species NOT DETECTED NOT DETECTED Final   Serratia marcescens NOT DETECTED NOT DETECTED Final   Haemophilus influenzae NOT DETECTED NOT DETECTED Final   Neisseria meningitidis NOT DETECTED NOT DETECTED Final   Pseudomonas aeruginosa NOT DETECTED NOT DETECTED Final   Candida albicans NOT DETECTED NOT DETECTED Final   Candida glabrata NOT DETECTED NOT DETECTED Final  Candida krusei NOT DETECTED NOT DETECTED Final   Candida parapsilosis NOT DETECTED NOT DETECTED Final   Candida tropicalis NOT DETECTED NOT DETECTED Final  MRSA PCR Screening     Status: None   Collection Time: 02/01/17 11:15 PM  Result Value Ref Range Status   MRSA by PCR NEGATIVE NEGATIVE Final    Comment:        The GeneXpert MRSA Assay (FDA approved for NASAL specimens only), is one component of a comprehensive MRSA colonization surveillance program. It is not intended to diagnose MRSA infection nor to guide or monitor treatment for MRSA infections.       Studies: No results found.  Scheduled Meds: . enoxaparin (LOVENOX) injection  30 mg Subcutaneous Daily  . hydrocortisone sod succinate (SOLU-CORTEF) inj  50 mg Intravenous Q12H  . insulin aspart  0-9 Units Subcutaneous Q4H  . insulin glargine  10 Units Subcutaneous Daily  . mouth rinse  15 mL Mouth Rinse BID    Continuous Infusions: . dextrose 75 mL/hr at 02/05/17 0652  . fluconazole (DIFLUCAN) IV 100 mg (02/05/17 1124)  . piperacillin-tazobactam (ZOSYN)  IV 3.375 g (02/05/17 1014)     LOS: 4 days     Laverna Peace, MD [facility] Pager [phone]  If 7PM-7AM, please contact night-coverage [email or web address] Password [email or web address] 02/05/2017, 1:32 PM

## 2017-02-05 NOTE — Progress Notes (Signed)
Pharmacy Antibiotic Note  Eric CopaRobert Cameron is a 82 y.o. male admitted on 02/01/2017 with sepsis.   Continues on Zosyn / Fluconazole Scr stable Vancomycin stopped 10/20  Plan: Continue Zosyn 3.375 grams iv Q 8 hours - 4 hour infusion Continue fluconazole 100 mg iv Q 24 hours Pharmacy to sign off and will follow peripherally for changes in renal function  Temp (24hrs), Avg:98.8 F (37.1 C), Min:97.4 F (36.3 C), Max:99.7 F (37.6 C)   Recent Labs Lab 02/01/17 1640 02/01/17 1649  02/01/17 2054 02/02/17 0024 02/02/17 0256 02/02/17 0722 02/02/17 1112 02/02/17 1458 02/03/17 0729 02/04/17 0551  WBC 27.3*  --   --   --   --   --   --   --   --  17.3* 12.8*  CREATININE 3.68*  --   < >  --  2.28* 2.04* 1.73* 1.71* 1.67* 1.51* 1.56*  LATICACIDVEN  --  3.18*  --  4.05* 2.6* 2.7*  --   --   --   --   --   < > = values in this interval not displayed.  Estimated Creatinine Clearance: 25.5 mL/min (A) (by C-G formula based on SCr of 1.56 mg/dL (H)).    No Known Allergies   Thank you Okey RegalLisa Sammantha Mehlhaff, PharmD [phone]  02/05/2017 11:46 AM

## 2017-02-05 NOTE — Consult Note (Signed)
Consultation Note Date: 02/05/2017   Patient Name: Eric Cameron  DOB: 1934-10-18  MRN: [id number]  Age / Sex: 82 y.o., male  PCP: No primary care provider on file. Referring Physician: Laverna PeaceNettey, Shayla D, MD  Reason for Consultation: Establishing goals of care  HPI/Patient Profile: 82 y.o. male  with past medical history of alzheimers dementia, seizures, DM and extreme deconditioning who was admitted on 02/01/2017 with altered mental status, sepsis and DKA.  Since admission the patient has remained in the fetal position, uttering vary few words, sleeping most of the time and eating only minimal amounts.  Improvement in his labs has not brought improvement in his clinical appearance.  His BP has been low and he has required multiple boluses.  Clinical Assessment and Goals of Care:  I have reviewed medical records including EPIC notes, labs and imaging, received report from the beside RN team in the Neuro ICU, assessed the patient and then talked with his sister [place] Callasleanor [name] by phone  to discuss diagnosis prognosis, GOC, EOL wishes, disposition and options.  I introduced Palliative Medicine as specialized medical care for people living with serious illness. It focuses on providing relief from the symptoms and stress of a serious illness. The goal is to improve quality of life for both the patient and the family.  We discussed a brief life review of the patient. He has been living at home with his sister.  Until last week she states that he was able to walk and was eating.  I explained that at this point he is not improving and remains in the fetal position not eating.  His sister was realistic and commented "well you have to eat to live".  She was very sad at the idea that Molly MaduroRobert may not improve.   [name] said she had to go out of town and would be back on Tuesday or more likely Wednesday.  I asked her about  Quantity of life vs Quality of life.  She responded that she still wanted to give him a chance.  At this point him family requests FULL SCOPE TREATMENT.  He is DNR/DNI.  I asked her to let her family know he was not doing well and that the doctors were concerned he may be starting the process of dying.  Of offered to update her daily.  She said she would keep her phone on her and that she would come to the hospital as soon as she returns from her trip.   Primary Decision Maker:  NEXT OF KIN Sister [place] Callasleanor [name].  Patient also has a brother.    SUMMARY OF RECOMMENDATIONS    Please update Ms. [name] regularly on the status of her brother.  We will plan for a family meeting as soon as she returns to town.    Would not escalate care.  He is DNR/DNI.  PEG/feeding tube is not medically indicated in patient's with advanced dementia.    Code Status/Advance Care Planning:  DNR/DNI   Symptom Management:   Patient appears in  pain.  However, hypotension is a concern.  Will write for oral morphine prn pain/dyspnea with holding parameters.  Psycho-social/Spiritual:   Desire for further Chaplaincy support: yes  Prognosis: days to weeks given end stage dementia and minimal PO intake.    Discharge Planning: Anticipated Hospital Death vs Hospice House.      Primary Diagnoses: Present on Admission: . Severe sepsis (HCC) . DKA (diabetic ketoacidosis) (HCC) . Acute renal failure (ARF) (HCC) . Alzheimer's dementia without behavioral disturbance . HTN (hypertension) . UTI (urinary tract infection)   I have reviewed the medical record, interviewed the patient and family, and examined the patient. The following aspects are pertinent.  Past Medical History:  Diagnosis Date  . Acute encephalopathy   . Alzheimer disease   . Dehydration   . Dementia   . Depression   . Diabetes mellitus without complication (HCC)   . Elevated troponin   . Hypertension   . PNA (pneumonia)   . Pressure  ulcer   . Seizures [facility])    Social History   Social History  . Marital status: Single    Spouse name: N/A  . Number of children: 0  . Years of education: 4   Occupational History  . retired    Social History Main Topics  . Smoking status: Former Smoker    Quit date: 07/07/2010  . Smokeless tobacco: Never Used  . Alcohol use No  . Drug use: No  . Sexual activity: Not Asked   Other Topics Concern  . None   Social History Narrative   Lives with sister and brother   Caffeine use: Tea    Very little coffee   Soda sometimes   Family History  Problem Relation Age of Onset  . Dementia Mother   . Colon cancer Father   . Diabetes Mellitus II Brother    Scheduled Meds: . enoxaparin (LOVENOX) injection  30 mg Subcutaneous Daily  . hydrocortisone sod succinate (SOLU-CORTEF) inj  50 mg Intravenous Q12H  . insulin aspart  0-9 Units Subcutaneous Q4H  . insulin glargine  10 Units Subcutaneous Daily  . mouth rinse  15 mL Mouth Rinse BID   Continuous Infusions: . dextrose 75 mL/hr at 02/05/17 0652  . fluconazole (DIFLUCAN) IV Stopped (02/04/17 1009)  . piperacillin-tazobactam (ZOSYN)  IV 3.375 g (02/05/17 1014)   PRN Meds:. No Known Allergies Review of Systems patient with advanced dementia, not speaking.  Physical Exam  Cachectic frail man in the fetal position.   Will attempt to respond to some questions with sounds. CV rrr brady. Resp no distress Abdomen thin, ND, NT, soft Ext.  Trace edema.  Vital Signs: BP (!) 88/46   Pulse (!) 58   Temp (!) 97.4 F (36.3 C) (Oral)   Resp 19   Ht 5\' 5"  (1.651 m)   Wt 47.8 kg (105 lb 6.1 oz)   SpO2 100%   BMI 17.54 kg/m  Pain Assessment: No/denies pain   Pain Score: 0-No pain   SpO2: SpO2: 100 % O2 Device:SpO2: 100 % O2 Flow Rate: .O2 Flow Rate (L/min): 3 L/min  IO: Intake/output summary:  Intake/Output Summary (Last 24 hours) at 02/05/17 1111 Last data filed at 02/05/17 1059  Gross per 24 hour  Intake           2723.75 ml  Output             2300 ml  Net           423.75 ml  LBM: Last BM Date: 02/04/17 Baseline Weight: Weight: 47.8 kg (105 lb 6.1 oz) Most recent weight: Weight: 47.8 kg (105 lb 6.1 oz)     Palliative Assessment/Data: 20%     Time In: 10:00 Time Out: 11:05 Time Total: 65 min. Greater than 50%  of this time was spent counseling and coordinating care related to the above assessment and plan.  Signed by: Norvel Richards, PA-C Palliative Medicine Pager: [phone]  Please contact Palliative Medicine Team phone at [phone] for questions and concerns.  For individual provider: See Loretha Stapler

## 2017-02-06 LAB — GLUCOSE, CAPILLARY
GLUCOSE-CAPILLARY: 198 mg/dL — AB (ref 65–99)
GLUCOSE-CAPILLARY: 151 mg/dL — AB (ref 65–99)
GLUCOSE-CAPILLARY: 174 mg/dL — AB (ref 65–99)
Glucose-Capillary: 182 mg/dL — ABNORMAL HIGH (ref 65–99)
Glucose-Capillary: 211 mg/dL — ABNORMAL HIGH (ref 65–99)
Glucose-Capillary: 216 mg/dL — ABNORMAL HIGH (ref 65–99)
Glucose-Capillary: 323 mg/dL — ABNORMAL HIGH (ref 65–99)

## 2017-02-06 LAB — CBC
HCT: 24.7 % — ABNORMAL LOW (ref 39.0–52.0)
Hemoglobin: 7.4 g/dL — ABNORMAL LOW (ref 13.0–17.0)
MCH: 25.7 pg — ABNORMAL LOW (ref 26.0–34.0)
MCHC: 30 g/dL (ref 30.0–36.0)
MCV: 85.8 fL (ref 78.0–100.0)
PLATELETS: 415 10*3/uL — AB (ref 150–400)
RBC: 2.88 MIL/uL — ABNORMAL LOW (ref 4.22–5.81)
RDW: 15.8 % — AB (ref 11.5–15.5)
WBC: 14.2 10*3/uL — ABNORMAL HIGH (ref 4.0–10.5)

## 2017-02-06 LAB — CULTURE, BLOOD (ROUTINE X 2)
Culture: NO GROWTH
Special Requests: ADEQUATE

## 2017-02-06 LAB — BASIC METABOLIC PANEL
Anion gap: 11 (ref 5–15)
BUN: 38 mg/dL — AB (ref 6–20)
CALCIUM: 8.6 mg/dL — AB (ref 8.9–10.3)
CO2: 17 mmol/L — AB (ref 22–32)
CREATININE: 1.04 mg/dL (ref 0.61–1.24)
Chloride: 114 mmol/L — ABNORMAL HIGH (ref 101–111)
Glucose, Bld: 147 mg/dL — ABNORMAL HIGH (ref 65–99)
Potassium: 3.2 mmol/L — ABNORMAL LOW (ref 3.5–5.1)
Sodium: 142 mmol/L (ref 135–145)

## 2017-02-06 MED ORDER — GLUCERNA SHAKE PO LIQD: 237.0000 mL | Freq: Two times a day (BID) | ORAL | Status: DC | PRN

## 2017-02-06 MED ORDER — ENOXAPARIN SODIUM 40 MG/0.4ML ~~LOC~~ SOLN
40.0000 mg | Freq: Every day | SUBCUTANEOUS | Status: DC
  Administered 2017-02-06 – 2017-02-08 (×3): 40 mg via SUBCUTANEOUS
  Filled 2017-02-06 (×3): qty 0.4

## 2017-02-06 NOTE — Progress Notes (Signed)
Daily Progress Note   Patient Name: Eric Cameron       Date: 02/06/2017 DOB: [birth year]  Age: 82 y.o. MRN#: [id number] Attending Physician: Laverna PeaceNettey, Shayla D, MD Primary Care Physician: No primary care provider on file. Admit Date: 02/01/2017  Reason for Consultation/Follow-up: Establishing goals of care  Subjective: Patient able to lift his head and speak 1 word answers to me today.  He tells me he's not hungry.  He denies pain.  Discussed with Physical Therapy - Per PT the patient is doing better this hospitalization than he did last hospitalization.  Will likely be able to walk again soon.  Talked with/updated Moreland CallasEleanor [name], sister and primary care taker.  She should be back in town Wednesday and will come to the hospital at that time.   Assessment: Improving on IVF with dextrose and steroids in addition to other treatment.     Patient Profile/HPI:  82 y.o. male  with past medical history of alzheimers dementia, seizures, DM and extreme deconditioning who was admitted on 02/01/2017 with altered mental status, sepsis and DKA.  Slowly improving with treatment.  Length of Stay: 5  Current Medications: Scheduled Meds:  . enoxaparin (LOVENOX) injection  40 mg Subcutaneous Daily  . hydrocortisone sod succinate (SOLU-CORTEF) inj  50 mg Intravenous Q12H  . insulin aspart  0-9 Units Subcutaneous Q4H  . insulin glargine  10 Units Subcutaneous Daily  . mouth rinse  15 mL Mouth Rinse BID    Continuous Infusions: . dextrose 75 mL/hr at 02/06/17 1219  . fluconazole (DIFLUCAN) IV Stopped (02/06/17 1133)  . piperacillin-tazobactam (ZOSYN)  IV 3.375 g (02/06/17 1040)    PRN Meds: feeding supplement (GLUCERNA SHAKE), morphine CONCENTRATE  Physical Exam        Thin frail demented man  lying in recliner chair, awake CV rrr resp no distress Abdomen soft, thin  Vital Signs: BP 125/71   Pulse 60   Temp (!) 97.2 F (36.2 C) (Oral)   Resp 15   Ht 5\' 5"  (1.651 m)   Wt 47.8 kg (105 lb 6.1 oz)   SpO2 100%   BMI 17.54 kg/m  SpO2: SpO2: 100 % O2 Device: O2 Device: Not Delivered O2 Flow Rate: O2 Flow Rate (L/min): 3 L/min  Intake/output summary:  Intake/Output Summary (Last 24 hours) at 02/06/17 1510 Last data  filed at 02/06/17 0600  Gross per 24 hour  Intake             1465 ml  Output              725 ml  Net              740 ml   LBM: Last BM Date: 02/05/17 Baseline Weight: Weight: 47.8 kg (105 lb 6.1 oz) Most recent weight: Weight: 47.8 kg (105 lb 6.1 oz)       Palliative Assessment/Data: 40%      Patient Active Problem List   Diagnosis Date Noted  . Pressure injury of skin 02/05/2017  . Palliative care encounter   . Goals of care, counseling/discussion   . Protein-calorie malnutrition, severe 02/04/2017  . Severe sepsis (HCC) 02/01/2017  . DKA (diabetic ketoacidosis) (HCC) 02/01/2017  . Acute renal failure (ARF) (HCC) 06/08/2016  . Elevated lactic acid level 06/08/2016  . Sepsis (HCC) 05/28/2016  . UTI (urinary tract infection) 05/28/2016  . Alzheimer's dementia without behavioral disturbance 04/10/2016  . Fungemia 07/18/2015  . Leukocytosis 07/18/2015  . Pressure ulcer of hip 07/06/2015  . PNA (pneumonia) 07/06/2015  . Elevated troponin 07/01/2015  . Dehydration 07/01/2015  . Seizures (HCC) 12/03/2013  . H/O: CVA (cerebrovascular accident) 12/01/2013  . Altered mental status 11/30/2013  . Acute encephalopathy 11/30/2013  . HTN (hypertension) 11/30/2013  . Diabetes mellitus type 2, uncontrolled (HCC) 11/30/2013  . HLD (hyperlipidemia) 11/30/2013    Palliative Care Plan    Recommendations/Plan:  Continue current care. PMT to meet with family on Wednesday.  Will likely offer hospice services at home - will need to reassess until  discharge to determine eligibility.  Goals of Care and Additional Recommendations:  Limitations on Scope of Treatment: Full Scope Treatment  Code Status:  DNR  Prognosis:  < 6 months secondary severe malnutrition,  FTT, dementia, likely recurrent dehydration  Discharge Planning:  To Be Determined SNF with Palliative vs  Sister's home with hospice  Care plan was discussed with PT  Thank you for allowing the Palliative Medicine Team to assist in the care of this patient.  Total time spent:  35 min.     Greater than 50%  of this time was spent counseling and coordinating care related to the above assessment and plan.  Norvel Richards, PA-C Palliative Medicine  Please contact Palliative MedicineTeam phone at [phone] for questions and concerns between 7 am - 7 pm.   Please see AMION for individual provider pager numbers.

## 2017-02-06 NOTE — Evaluation (Signed)
Physical Therapy Evaluation Patient Details Name: Eric Cameron MRN: [id number] DOB: December 13, 1934 Today's Date: 02/06/2017   History of Present Illness  Pt adm with sepsis due to UTI. PMH - dementia, DM, CVA  Clinical Impression  Pt admitted with above diagnosis and presents to PT with functional limitations due to deficits listed below (See PT problem list). Pt needs skilled PT to maximize independence and safety to allow discharge to SNF. Per chart pt lives with elderly sisters and doubt they can physically assist pt at this level.     Follow Up Recommendations SNF    Equipment Recommendations  None recommended by PT    Recommendations for Other Services       Precautions / Restrictions Precautions Precautions: Fall Restrictions Weight Bearing Restrictions: No      Mobility  Bed Mobility Overal bed mobility: Needs Assistance Bed Mobility: Supine to Sit     Supine to sit: Mod assist     General bed mobility comments: Assist to bring legs off bed and to elevate trunk into sitting  Transfers Overall transfer level: Needs assistance Equipment used: 1 person hand held assist Transfers: Sit to/from Stand;Stand Pivot Transfers Sit to Stand: Mod assist Stand pivot transfers: Mod assist       General transfer comment: Assist to bring hips up and for balance. Pt with small pivotal steps bed to chair with knees and hips in slight flexion and with posterior lean.  Ambulation/Gait Ambulation/Gait assistance: Mod assist Ambulation Distance (Feet): 3 Feet Assistive device: 1 person hand held assist (bil shelf arm support) Gait Pattern/deviations: Step-through pattern;Decreased step length - right;Decreased step length - left;Shuffle;Trunk flexed;Narrow base of support;Leaning posteriorly Gait velocity: decr Gait velocity interpretation: <1.8 ft/sec, indicative of risk for recurrent falls General Gait Details: Assist for balance and support. Pt amb 3 steps forward and unable  to step backwards  Careers information officer    Modified Rankin (Stroke Patients Only)       Balance Overall balance assessment: Needs assistance Sitting-balance support: No upper extremity supported;Feet supported Sitting balance-Leahy Scale: Fair     Standing balance support: Single extremity supported Standing balance-Leahy Scale: Poor Standing balance comment: mod assist for static standing                             Pertinent Vitals/Pain Pain Assessment: Faces Faces Pain Scale: Hurts little more Pain Location: head Pain Descriptors / Indicators: Aching Pain Intervention(s): Limited activity within patient's tolerance    Home Living Family/patient expects to be discharged to:: Private residence Living Arrangements: Other relatives (2 elderly sisters) Available Help at Discharge: Family;Available 24 hours/day Type of Home: House Home Access: Stairs to enter Entrance Stairs-Rails: None Entrance Stairs-Number of Steps: 1 small step Home Layout: One level        Prior Function Level of Independence: Needs assistance   Gait / Transfers Assistance Needed: Per chart pt ambulating independently without assistive device           Hand Dominance   Dominant Hand: Left    Extremity/Trunk Assessment   Upper Extremity Assessment Upper Extremity Assessment: Generalized weakness    Lower Extremity Assessment Lower Extremity Assessment: Generalized weakness       Communication      Cognition Arousal/Alertness: Awake/alert Behavior During Therapy: WFL for tasks assessed/performed Overall Cognitive Status: No family/caregiver present to determine baseline cognitive functioning  General Comments: dementia at baseline. Pt following 1 step commands and communicating some      General Comments      Exercises     Assessment/Plan    PT Assessment Patient needs continued PT services   PT Problem List Decreased strength;Decreased activity tolerance;Decreased balance;Decreased mobility;Decreased knowledge of use of DME       PT Treatment Interventions DME instruction;Gait training;Functional mobility training;Therapeutic activities;Therapeutic exercise;Balance training;Patient/family education    PT Goals (Current goals can be found in the Care Plan section)  Acute Rehab PT Goals Patient Stated Goal: Pt didn't state PT Goal Formulation: With patient Time For Goal Achievement: 02/20/17 Potential to Achieve Goals: Good    Frequency Min 3X/week   Barriers to discharge Decreased caregiver support sisters who are caregivers are elderly and likely can't provide needed physical assist    Co-evaluation               AM-PAC PT "6 Clicks" Daily Activity  Outcome Measure Difficulty turning over in bed (including adjusting bedclothes, sheets and blankets)?: A Little Difficulty moving from lying on back to sitting on the side of the bed? : Unable Difficulty sitting down on and standing up from a chair with arms (e.g., wheelchair, bedside commode, etc,.)?: Unable Help needed moving to and from a bed to chair (including a wheelchair)?: A Lot Help needed walking in hospital room?: A Lot Help needed climbing 3-5 steps with a railing? : Total 6 Click Score: 10    End of Session Equipment Utilized During Treatment: Gait belt Activity Tolerance: Patient tolerated treatment well Patient left: in chair;with call bell/phone within reach;with chair alarm set Nurse Communication: Mobility status PT Visit Diagnosis: Unsteadiness on feet (R26.81);Other abnormalities of gait and mobility (R26.89);Muscle weakness (generalized) (M62.81)    Time: [id number] PT Time Calculation (min) (ACUTE ONLY): 20 min   Charges:   PT Evaluation $PT Eval Moderate Complexity: 1 Mod     PT G CodesMarland Kitchen:        [facility] PT [phone]   Angelina OkCary W [facility] 02/06/2017, 1:16 PM

## 2017-02-06 NOTE — Care Management Important Message (Signed)
Important Message  Patient Details  Name: Eric Cameron MRN: [id number] Date of Birth: July 17, 1934   Medicare Important Message Given:  Yes    [name] Abena 02/06/2017, 11:03 AM

## 2017-02-06 NOTE — Clinical Social Work Note (Signed)
Clinical Social Work Assessment  Patient Details  Name: Eric CopaRobert Buswell MRN: [id number] Date of Birth: September 21, 1934  Date of referral:  02/06/17               Reason for consult:  Facility Placement                Permission sought to share information with:  Family Supports Permission granted to share information::  Yes, Verbal Permission Granted  Name::     [place] Callasleanor [name]  Agency::     Relationship::     Contact Information:     Housing/Transportation Living arrangements for the past 2 months:  Single Family Home (with sister. ) Source of Information:  Other (Comment Required) (pt' sister. ) Patient Interpreter Needed:  None Criminal Activity/Legal Involvement Pertinent to Current Situation/Hospitalization:  No - Comment as needed Significant Relationships:  Siblings Lives with:  Siblings Do you feel safe going back to the place where you live?  Yes Need for family participation in patient care:  Yes (Comment)  Care giving concerns:  CSW spoke with pt's sister Vena Austria([name]) as chart mentions that pt has dementia. At this time [name] expressed concerns about pt's blood sugar. CSW suggested that pt's sister speak with RN as RN might be able to reassure [name] of any changes in pt's blood sugar at this time.    Social Worker assessment / plan:  CSW spoke with pt's sister via phone. During this time CSW was informed that pt is from home with sister where pt has lived for almost 10 years. [name] expressed that she is pt's primary care taker along with their brother. [name] is open to rehab for pt and is [address]   Employment status:  Retired Health and safety inspectornsurance information:  Medicare PT Recommendations:  Skilled Nursing Facility Information / Referral to community resources:  Skilled Nursing Facility  Patient/Family's Response to care:  Pt's sister is understanding and agreeable to plan of care at this time.   Patient/Family's Understanding of and Emotional Response to  Diagnosis, Current Treatment, and Prognosis:  No further questions or concerns have been presented to CSW at this time.   Emotional Assessment Appearance:  Appears stated age Attitude/Demeanor/Rapport:  Avoidant Affect (typically observed):    Orientation:   (pt has demntia ) Alcohol / Substance use:  Not Applicable Psych involvement (Current and /or in the community):  No (Comment)  Discharge Needs  Concerns to be addressed:  No discharge needs identified Readmission within the last 30 days:  No Current discharge risk:  None Barriers to Discharge:  No Barriers Identified   Robb MatarKierra S Jadda Hunsucker, LCSWA 02/06/2017, 1:53 PM

## 2017-02-06 NOTE — Progress Notes (Signed)
Arrived at room.  Blanket kind of over patient's head.  Consult stated that family would like information on Living will.  Chaplain came to provide information and paperwork about medical advanced directive and living will.  No family in room at this time. Patient not alert to talk with me about Living Will.  Not able to complete.  Please place another consult or page Chaplain should additional support be needed.      02/06/17 1043  Clinical Encounter Type  Visited With Health care provider;Patient  Visit Type Initial;Psychological support;Social support

## 2017-02-06 NOTE — NC FL2 (Signed)
[place] MEDICAID FL2 LEVEL OF CARE SCREENING TOOL     IDENTIFICATION  Patient Name: Eric Cameron Birthdate: 04-Jun-1934 Sex: male Admission Date (Current Location): 02/01/2017  [facility] and [place] Number:  Producer, television/film/video and Address:  [facility], [address] [place], [place] [address]      Provider Number: [id number]  Attending Physician Name and Address:  Laverna Peace, MD  Relative Name and Phone Number:       Current Level of Care: Hospital Recommended Level of Care: Skilled Nursing Facility Prior Approval Number:    Date Approved/Denied:   PASRR Number:   [id number]   Discharge Plan: SNF    Current Diagnoses: Patient Active Problem List   Diagnosis Date Noted  . Pressure injury of skin 02/05/2017  . Palliative care encounter   . Goals of care, counseling/discussion   . Protein-calorie malnutrition, severe 02/04/2017  . Severe sepsis (HCC) 02/01/2017  . DKA (diabetic ketoacidosis) (HCC) 02/01/2017  . Acute renal failure (ARF) (HCC) 06/08/2016  . Elevated lactic acid level 06/08/2016  . Sepsis (HCC) 05/28/2016  . UTI (urinary tract infection) 05/28/2016  . Alzheimer's dementia without behavioral disturbance 04/10/2016  . Fungemia 07/18/2015  . Leukocytosis 07/18/2015  . Pressure ulcer of hip 07/06/2015  . PNA (pneumonia) 07/06/2015  . Elevated troponin 07/01/2015  . Dehydration 07/01/2015  . Seizures (HCC) 12/03/2013  . H/O: CVA (cerebrovascular accident) 12/01/2013  . Altered mental status 11/30/2013  . Acute encephalopathy 11/30/2013  . HTN (hypertension) 11/30/2013  . Diabetes mellitus type 2, uncontrolled (HCC) 11/30/2013  . HLD (hyperlipidemia) 11/30/2013    Orientation RESPIRATION BLADDER Height & Weight     Self  Normal Incontinent Weight: 105 lb 6.1 oz (47.8 kg) Height:  5\' 5"  (165.1 cm)  BEHAVIORAL SYMPTOMS/MOOD NEUROLOGICAL BOWEL NUTRITION STATUS      Incontinent Diet (please see discharge summay.  )  AMBULATORY STATUS COMMUNICATION OF NEEDS Skin   Extensive Assist   Skin abrasions (redness on hip. )                       Personal Care Assistance Level of Assistance  Bathing, Feeding, Dressing Bathing Assistance: Maximum assistance Feeding assistance: Limited assistance Dressing Assistance: Maximum assistance     Functional Limitations Info  Sight, Hearing, Speech Sight Info: Adequate Hearing Info: Adequate Speech Info: Impaired    SPECIAL CARE FACTORS FREQUENCY  PT (By licensed PT), OT (By licensed OT)     PT Frequency: 5 times a week.  OT Frequency: 5 times a week.             Contractures Contractures Info: Not present    Additional Factors Info  Code Status, Allergies Code Status Info: DNR Allergies Info: NKA           Current Medications (02/06/2017):  This is the current hospital active medication list Current Facility-Administered Medications  Medication Dose Route Frequency Provider Last Rate Last Dose  . dextrose 5 % solution   Intravenous Continuous Leatha Gilding, MD 75 mL/hr at 02/06/17 1219    . enoxaparin (LOVENOX) injection 40 mg  40 mg Subcutaneous Daily Sallee Provencal, RPH   40 mg at 02/06/17 1033  . fluconazole (DIFLUCAN) IVPB 100 mg  100 mg Intravenous Q24H Leatha Gilding, MD   Stopped at 02/06/17 1133  . hydrocortisone sodium succinate (SOLU-CORTEF) 100 MG injection 50 mg  50 mg Intravenous Q12H Leatha Gilding, MD   50  mg at 02/06/17 0604  . insulin aspart (novoLOG) injection 0-9 Units  0-9 Units Subcutaneous Q4H Leatha GildingGherghe, Costin M, MD   1 Units at 02/06/17 0848  . insulin glargine (LANTUS) injection 10 Units  10 Units Subcutaneous Daily Leatha GildingGherghe, Costin M, MD   10 Units at 02/06/17 1033  . MEDLINE mouth rinse  15 mL Mouth Rinse BID Leatha GildingGherghe, Costin M, MD   15 mL at 02/06/17 1041  . morphine CONCENTRATE 10 MG/0.5ML oral solution 2.6-5 mg  2.6-5 mg Sublingual Q2H PRN Dellinger, Tora KindredMarianne L, PA-C      . piperacillin-tazobactam  (ZOSYN) IVPB 3.375 g  3.375 g Intravenous Q8H Ann HeldMartin, Elizabeth J, RPH 12.5 mL/hr at 02/06/17 1040 3.375 g at 02/06/17 1040     Discharge Medications: Please see discharge summary for a list of discharge medications.  Relevant Imaging Results:  Relevant Lab Results:   Additional Information SSN- [id number]  Robb MatarKierra S Ramiel Forti, LCSWA

## 2017-02-06 NOTE — Progress Notes (Signed)
PROGRESS NOTE  Altus Zaino WJX:[id number] DOB: 03-Mar-1935 DOA: 02/01/2017 PCP: No primary care provider on file.  HPI/Recap of past 24 hours: Mr. Dubie is a 82 year old male with medical history significant for dementia, type 2 diabetes, CVA who presented on 02/01/2017 with weakness and AMS per family report.  On arrival to the ED, glucose greater than 500, lactic acid, bicarb 13, creatinine 3, BUN 115, anion gap 8, Cl 123, sodium 146.  Found to have greater than 100,000 colonies on urine for moderate, few bacteria culture, no ketones. Chest x-ray on admission showed no pulmonary abnormalities.   Patient was admitted for sepsis UTI 2/2 yeast. Also treated for presumed DKA likely secondary to infection and acute renal failure in setting of dehydration( poor PO intake and DKA) on admission.  Initially empirically started on (given ceftriaxone in ED) vancomycin and ceftriaxone given profound sepsis plus lactic acidosis and hypotension.  Urine culture showed yeast, Diflucan started 10/19. Zosyn continued for bacterial coverage given persistent fever, tachycardia and hypotension.  Patient continues to deny any pain. Says good morning to me. Has no complaints. No acute events overnight.    Assessment/Plan: Principal Problem:   Severe sepsis (HCC) Active Problems:   HTN (hypertension)   Seizures (HCC)   Alzheimer's dementia without behavioral disturbance   UTI (urinary tract infection)   Acute renal failure (ARF) (HCC)   DKA (diabetic ketoacidosis) (HCC)   Protein-calorie malnutrition, severe   Palliative care encounter   Goals of care, counseling/discussion   Pressure injury of skin  Severe Sepsis secondary to UTI, etiology yeast, sepsis improving Septic Shock, improving - BP improving on IV solumedrol ( lactic acidosis normalized) - remains afebrile for 24 hours, leukocytosis gone up slightly - Continue Diflucan and Zosyn  T2DM, poorly controlled hyperglycemia, improving Initial  concern for DKA on admission (glucose >500, bicarb 13, no urine ketones). However non anion gap hyperchloremic metabolic acidosis likely related to acute renal failure -hyperglycemia improved with brief insulin gtt - now maintaining on lantus 10  and SSI  Acute renal failure, multifactorial etiology, likely pre-renal ( poor PO intake and sepsis) Non anion gap metabolic acidosis, improving Admission Cr 3. Baseline 0.8-0.9. Currently 1.56 Co2 currently 16 (improved from 12) Improving with fluid resuscitation (D5 soln) and antibiotics, -Monitor I/O, avoid nephrotoxins   Hypovolemic Hypernatremia, improving In setting of profound dehydration (poor po intake) and hypotension(severe sepsis) Currently 142 ( peak 153) -improved with fluid resuscitation, on D5% 75 cc - monitor BMP  Goals of Care Palliative Care team maintaining contact with sister who reiterates he is DNR/DNI with full scope treatment -symptomatic pain control ( hold for systolic blood pressure< 90) with PRN morphine    Alzheimer's Dementia, advanced Per sister baseline prior to hospitalization was living with his sister. Able to walk and eat  Per chart review patient has not been on Aricept/donepeizil before ( unclear if unknown contraindication) -last evaluated by neurology 03/2016 deemed to have substantial cognitive loss   Stage 1 Pressure Ulcer, present on admission Bandages applied to hip and lower sacrum   Code Status: DNR/DNI  Family Communication: Will call sister,[name] [name] to update  Disposition Plan: continue goals of care with family, continue antibiotics   Consultants:  Palliative Care  Procedures:  None  Antimicrobials:  Vancomycin 10/18-10/19  Zosyn 10/17-Current  Diflucan 10/19-Current   DVT prophylaxis:  Enoxaparin   Objective: Vitals:   02/06/17 0700 02/06/17 0735 02/06/17 1100 02/06/17 1200  BP: 112/70 109/63 (!) 133/122 125/71  Pulse: (!) 51 (!)  54 73 60  Resp: (!) 26 20  (!) 26 15  Temp:  (!) 97.2 F (36.2 C)    TempSrc:  Oral    SpO2: 100% 100% 100% 100%  Weight:      Height:        Intake/Output Summary (Last 24 hours) at 02/06/17 1505 Last data filed at 02/06/17 0600  Gross per 24 hour  Intake             1465 ml  Output              725 ml  Net              740 ml   Filed Weights   02/01/17 2300 02/01/17 2326  Weight: 47.8 kg (105 lb 6.1 oz) 47.8 kg (105 lb 6.1 oz)    Exam:   General:  Lying in bed in fetal position, in obvious distress, thin, very cachetic older gentleman  Cardiovascular: difficult to assess given his position ( has his arms crossed over his chest and will not move them)   Respiratory: no wheezes, rhonchi, or rales on exam, on room air   Musculoskeletal: limited range of motion ( unclear if from effort)  Neurologic: Follows some commands ( gave thumbs up with right hand when asked, wiggled toes of both feet when asked), oriented to person, place ( named hospital), time ( only season, not able to name month or year)  Skin: dry, intact     Data Reviewed: CBC:  Recent Labs Lab 02/01/17 1640 02/01/17 2053 02/03/17 0729 02/04/17 0551 02/06/17 0731  WBC 27.3*  --  17.3* 12.8* 14.2*  NEUTROABS 24.5*  --   --   --   --   HGB 8.2* 9.9* 8.0* 7.8* 7.4*  HCT 26.6* 29.0* 26.1* 25.3* 24.7*  MCV 86.4  --  85.6 86.1 85.8  PLT 613*  --  537* 457* 415*   Basic Metabolic Panel:  Recent Labs Lab 02/02/17 1112 02/02/17 1458 02/03/17 0729 02/04/17 0551 02/06/17 0731  NA 148* 148* 153* 149* 142  K 4.2 4.5 4.5 3.5 3.2*  CL 125* 125* 128* 125* 114*  CO2 12* 13* 15* 16* 17*  GLUCOSE 139* 117* 123* 106* 147*  BUN 85* 83* 69* 60* 38*  CREATININE 1.71* 1.67* 1.51* 1.56* 1.04  CALCIUM 10.1 10.0 10.3 9.6 8.6*   GFR: Estimated Creatinine Clearance: 38.3 mL/min (by C-G formula based on SCr of 1.04 mg/dL). Liver Function Tests:  Recent Labs Lab 02/01/17 1640 02/03/17 0729  AST 17 25  ALT 19 19  ALKPHOS 81 63    BILITOT 0.3 0.5  PROT 9.0* 7.7  ALBUMIN 2.1* 1.8*   No results for input(s): LIPASE, AMYLASE in the last 168 hours. No results for input(s): AMMONIA in the last 168 hours. Coagulation Profile:  Recent Labs Lab 02/01/17 1640  INR 1.27   Cardiac Enzymes: No results for input(s): CKTOTAL, CKMB, CKMBINDEX, TROPONINI in the last 168 hours. BNP (last 3 results) No results for input(s): PROBNP in the last 8760 hours. HbA1C: No results for input(s): HGBA1C in the last 72 hours. CBG:  Recent Labs Lab 02/05/17 2359 02/06/17 0151 02/06/17 0437 02/06/17 0732 02/06/17 1433  GLUCAP 205* 198* 182* 151* 174*   Lipid Profile: No results for input(s): CHOL, HDL, LDLCALC, TRIG, CHOLHDL, LDLDIRECT in the last 72 hours. Thyroid Function Tests: No results for input(s): TSH, T4TOTAL, FREET4, T3FREE, THYROIDAB in the last 72 hours. Anemia Panel: No results for input(s): VITAMINB12,  FOLATE, FERRITIN, TIBC, IRON, RETICCTPCT in the last 72 hours. Urine analysis:    Component Value Date/Time   COLORURINE YELLOW 02/01/2017 1728   APPEARANCEUR TURBID (A) 02/01/2017 1728   LABSPEC 1.013 02/01/2017 1728   PHURINE 5.0 02/01/2017 1728   GLUCOSEU NEGATIVE 02/01/2017 1728   HGBUR MODERATE (A) 02/01/2017 1728   BILIRUBINUR NEGATIVE 02/01/2017 1728   KETONESUR NEGATIVE 02/01/2017 1728   PROTEINUR 100 (A) 02/01/2017 1728   UROBILINOGEN 1.0 11/30/2013 1734   NITRITE NEGATIVE 02/01/2017 1728   LEUKOCYTESUR MODERATE (A) 02/01/2017 1728   Sepsis Labs: @LABRCNTIP (procalcitonin:4,lacticidven:4)  ) Recent Results (from the past 240 hour(s))  Culture, blood (Routine x 2)     Status: None   Collection Time: 02/01/17  4:40 PM  Result Value Ref Range Status   Specimen Description BLOOD RIGHT HAND  Final   Special Requests   Final    BOTTLES DRAWN AEROBIC ONLY Blood Culture adequate volume   Culture NO GROWTH 5 DAYS  Final   Report Status 02/06/2017 FINAL  Final  Urine Culture     Status: Abnormal    Collection Time: 02/01/17  5:28 PM  Result Value Ref Range Status   Specimen Description URINE, CATHETERIZED  Final   Special Requests NONE  Final   Culture >=100,000 COLONIES/mL YEAST (A)  Final   Report Status 02/02/2017 FINAL  Final  Culture, blood (Routine x 2)     Status: Abnormal   Collection Time: 02/01/17  6:05 PM  Result Value Ref Range Status   Specimen Description BLOOD RIGHT ANTECUBITAL  Final   Special Requests IN PEDIATRIC BOTTLE Blood Culture adequate volume  Final   Culture  Setup Time   Final    GRAM POSITIVE COCCI IN CLUSTERS IN PEDIATRIC BOTTLE CRITICAL RESULT CALLED TO, READ BACK BY AND VERIFIED WITH: A JOHNSTON PHARMD 2239 02/02/17 A BROWNING    Culture (A)  Final    STAPHYLOCOCCUS SPECIES (COAGULASE NEGATIVE) THE SIGNIFICANCE OF ISOLATING THIS ORGANISM FROM A SINGLE SET OF BLOOD CULTURES WHEN MULTIPLE SETS ARE DRAWN IS UNCERTAIN. PLEASE NOTIFY THE MICROBIOLOGY DEPARTMENT WITHIN ONE WEEK IF SPECIATION AND SENSITIVITIES ARE REQUIRED.    Report Status 02/03/2017 FINAL  Final  Blood Culture ID Panel (Reflexed)     Status: None   Collection Time: 02/01/17  6:05 PM  Result Value Ref Range Status   Enterococcus species NOT DETECTED NOT DETECTED Final   Listeria monocytogenes NOT DETECTED NOT DETECTED Final   Staphylococcus species NOT DETECTED NOT DETECTED Final   Staphylococcus aureus NOT DETECTED NOT DETECTED Final   Streptococcus species NOT DETECTED NOT DETECTED Final   Streptococcus agalactiae NOT DETECTED NOT DETECTED Final   Streptococcus pneumoniae NOT DETECTED NOT DETECTED Final   Streptococcus pyogenes NOT DETECTED NOT DETECTED Final   Acinetobacter baumannii NOT DETECTED NOT DETECTED Final   Enterobacteriaceae species NOT DETECTED NOT DETECTED Final   Enterobacter cloacae complex NOT DETECTED NOT DETECTED Final   Escherichia coli NOT DETECTED NOT DETECTED Final   Klebsiella oxytoca NOT DETECTED NOT DETECTED Final   Klebsiella pneumoniae NOT DETECTED NOT  DETECTED Final   Proteus species NOT DETECTED NOT DETECTED Final   Serratia marcescens NOT DETECTED NOT DETECTED Final   Haemophilus influenzae NOT DETECTED NOT DETECTED Final   Neisseria meningitidis NOT DETECTED NOT DETECTED Final   Pseudomonas aeruginosa NOT DETECTED NOT DETECTED Final   Candida albicans NOT DETECTED NOT DETECTED Final   Candida glabrata NOT DETECTED NOT DETECTED Final   Candida krusei NOT DETECTED NOT  DETECTED Final   Candida parapsilosis NOT DETECTED NOT DETECTED Final   Candida tropicalis NOT DETECTED NOT DETECTED Final  MRSA PCR Screening     Status: None   Collection Time: 02/01/17 11:15 PM  Result Value Ref Range Status   MRSA by PCR NEGATIVE NEGATIVE Final    Comment:        The GeneXpert MRSA Assay (FDA approved for NASAL specimens only), is one component of a comprehensive MRSA colonization surveillance program. It is not intended to diagnose MRSA infection nor to guide or monitor treatment for MRSA infections.       Studies: No results found.  Scheduled Meds: . enoxaparin (LOVENOX) injection  40 mg Subcutaneous Daily  . hydrocortisone sod succinate (SOLU-CORTEF) inj  50 mg Intravenous Q12H  . insulin aspart  0-9 Units Subcutaneous Q4H  . insulin glargine  10 Units Subcutaneous Daily  . mouth rinse  15 mL Mouth Rinse BID    Continuous Infusions: . dextrose 75 mL/hr at 02/06/17 1219  . fluconazole (DIFLUCAN) IV Stopped (02/06/17 1133)  . piperacillin-tazobactam (ZOSYN)  IV 3.375 g (02/06/17 1040)     LOS: 5 days     Laverna Peace, MD [facility] Pager [phone]  If 7PM-7AM, please contact night-coverage [email or web address] Password [email or web address] 02/06/2017, 3:05 PM

## 2017-02-06 NOTE — Progress Notes (Signed)
Speech Language Pathology Treatment: Dysphagia  Patient Details Name: Eric CopaRobert Cameron MRN: [id number] DOB: 07-23-1934 Today's Date: 02/06/2017 Time: [id number] SLP Time Calculation (min) (ACUTE ONLY): 12 min  Assessment / Plan / Recommendation Clinical Impression  Pt needs frequent repositioning due to tendency to want to lay down in the fetal position. He can be repositioned for intake, but will readjust himself quickly until PO trials started. Once he did start eating he maintained a fairly upright position. Prolonged oral preparation and transit is noted with purees, but he clears all POs from his mouth well without overt signs of aspiration. Recommend that he continue with current diet and precautions. Pt will still need supervision during meals to help with safe positioning, initiation, and sustained attention to meal tray in order to help encourage safe intake.   HPI HPI: Ptis an 82 y.o.male presenting with AMS and found to have sepsis from UTI. PMH includes dementia, HTN, DM, seizures, PNA, dehydration, and depression. Pt has had swallow studies in the past, including MBS in 2015 that recommended Dys 3 diet and thin liquids. Since then he has had three clinical evaluations, and although he has been able to stay on thin liquids, his most recent diet recommendation was for Dys 1 textures due in large part to his cognitive status.      SLP Plan  Continue with current plan of care       Recommendations  Diet recommendations: Dysphagia 1 (puree);Thin liquid Liquids provided via: Straw Medication Administration: Crushed with puree Supervision: Staff to assist with self feeding;Full supervision/cueing for compensatory strategies Compensations: Slow rate;Small sips/bites;Lingual sweep for clearance of pocketing Postural Changes and/or Swallow Maneuvers: Seated upright 90 degrees                Oral Care Recommendations: Oral care BID Follow up Recommendations: Skilled Nursing  facility SLP Visit Diagnosis: Dysphagia, unspecified (R13.10) Plan: Continue with current plan of care       GO                Maxcine Hamaiewonsky, Thursa Emme 02/06/2017, 11:13 AM  Maxcine HamLaura Paiewonsky, M.A. CCC-SLP [phone]

## 2017-02-06 NOTE — Progress Notes (Signed)
Nutrition Follow-up  DOCUMENTATION CODES:   Severe malnutrition in context of chronic illness, Underweight  INTERVENTION:  Glucerna Shake PRN, each supplement provides 220 kcal and 10 grams of protein  NUTRITION DIAGNOSIS:   Malnutrition (severe) related to chronic illness (dementia, depression) as evidenced by severe depletion of body fat, severe depletion of muscle mass.  Ongoing   GOAL:   Patient will meet greater than or equal to 90% of their needs  Progressing  MONITOR:   PO intake, Supplement acceptance, Labs, Weight trends, Skin, I & O's  ASSESSMENT:   82 yo Male with history of seizures, dementia, hypertension, diabetes who presented to the hospital with altered mental status.  Patient has underlying dementia, and lives with his sisters, who tells me over the phone that over the last couple of days he has been more lethargic than normal.  He was found to be septic with a UTI and was admitted to the hospital broad-spectrum antibiotics.  Pt lethargic, in fetal position, and did not communicate at time of visit. Pt's untouched breakfast tray at bedside.  SLP evaluated pt and recommended Dysphagia 1 diet with thin liquids. Given pt's poor PO intake (0-10%), severe malnutrition, and risk for skin breakdown pt would benefit from oral nutrition supplement.  Palliative care following pt, prognosis of day to weeks.   Labs reviewed; CBG 112-240, K 3.2, Hemoglobin 7.4 Medications reviewed; sliding scale insulin, Lantus, 75 mL/hr D5  Diet Order:  DIET - DYS 1 Room service appropriate? Yes; Fluid consistency: Thin  Skin:  Wound (see comment) (Stage I to R & L hip)  Last BM:  02/05/17  Height:   Ht Readings from Last 1 Encounters:  02/01/17 5\' 5"  (1.651 m)    Weight:   Wt Readings from Last 1 Encounters:  02/01/17 105 lb 6.1 oz (47.8 kg)    Ideal Body Weight:  56.8 kg  BMI:  Body mass index is 17.54 kg/m.  Estimated Nutritional Needs:   Kcal:   1300-1500  Protein:  65-80 gm  Fluid:  >/= 1.5 L  EDUCATION NEEDS:   No education needs identified at this time  Fransisca Kaufmannllison Ioannides, MS, RDN, LDN 02/06/2017 2:08 PM

## 2017-02-07 DIAGNOSIS — E43 Unspecified severe protein-calorie malnutrition: Secondary | ICD-10-CM

## 2017-02-07 LAB — BASIC METABOLIC PANEL
ANION GAP: 8 (ref 5–15)
Anion gap: 6 (ref 5–15)
BUN: 32 mg/dL — AB (ref 6–20)
BUN: 26 mg/dL — ABNORMAL HIGH (ref 6–20)
CALCIUM: 8.4 mg/dL — AB (ref 8.9–10.3)
CO2: 17 mmol/L — ABNORMAL LOW (ref 22–32)
CO2: 20 mmol/L — AB (ref 22–32)
CREATININE: 0.86 mg/dL (ref 0.61–1.24)
Calcium: 8.5 mg/dL — ABNORMAL LOW (ref 8.9–10.3)
Chloride: 117 mmol/L — ABNORMAL HIGH (ref 101–111)
Chloride: 116 mmol/L — ABNORMAL HIGH (ref 101–111)
Creatinine, Ser: 0.99 mg/dL (ref 0.61–1.24)
GFR calc Af Amer: >60 mL/min (ref >60)
GFR calc Af Amer: >60 mL/min (ref >60)
GFR calc non Af Amer: >60 mL/min (ref >60)
Glucose, Bld: 155 mg/dL — ABNORMAL HIGH (ref 65–99)
Glucose, Bld: 271 mg/dL — ABNORMAL HIGH (ref 65–99)
POTASSIUM: 2.7 mmol/L — AB (ref 3.5–5.1)
Potassium: 3 mmol/L — ABNORMAL LOW (ref 3.5–5.1)
SODIUM: 142 mmol/L (ref 135–145)
Sodium: 142 mmol/L (ref 135–145)

## 2017-02-07 LAB — GLUCOSE, CAPILLARY
GLUCOSE-CAPILLARY: 161 mg/dL — AB (ref 65–99)
GLUCOSE-CAPILLARY: 154 mg/dL — AB (ref 65–99)
Glucose-Capillary: 226 mg/dL — ABNORMAL HIGH (ref 65–99)
Glucose-Capillary: 227 mg/dL — ABNORMAL HIGH (ref 65–99)
Glucose-Capillary: 261 mg/dL — ABNORMAL HIGH (ref 65–99)

## 2017-02-07 LAB — CBC
HEMATOCRIT: 22.4 % — AB (ref 39.0–52.0)
HEMOGLOBIN: 7.1 g/dL — AB (ref 13.0–17.0)
MCH: 26.9 pg (ref 26.0–34.0)
MCHC: 31.7 g/dL (ref 30.0–36.0)
MCV: 84.8 fL (ref 78.0–100.0)
Platelets: 373 10*3/uL (ref 150–400)
RBC: 2.64 MIL/uL — ABNORMAL LOW (ref 4.22–5.81)
RDW: 16.2 % — ABNORMAL HIGH (ref 11.5–15.5)
WBC: 14.3 10*3/uL — ABNORMAL HIGH (ref 4.0–10.5)

## 2017-02-07 MED ORDER — SODIUM CHLORIDE 0.9 % IV SOLN
INTRAVENOUS | Status: DC
  Administered 2017-02-08: 01:00:00 via INTRAVENOUS

## 2017-02-07 MED ORDER — POTASSIUM CHLORIDE 20 MEQ/15ML (10%) PO SOLN
40.0000 meq | ORAL | Status: AC
  Administered 2017-02-07: 40 meq via ORAL
  Filled 2017-02-07: qty 30

## 2017-02-07 MED ORDER — SOD CITRATE-CITRIC ACID 500-334 MG/5ML PO SOLN
30.0000 mL | Freq: Once | ORAL | Status: AC
  Administered 2017-02-07: 30 mL via ORAL
  Filled 2017-02-07: qty 30

## 2017-02-07 MED ORDER — POTASSIUM CITRATE-CITRIC ACID 1100-334 MG/5ML PO SOLN
40.0000 meq | Freq: Once | ORAL | Status: DC
  Filled 2017-02-07: qty 20

## 2017-02-07 MED ORDER — SODIUM CHLORIDE 0.9 % IV BOLUS (SEPSIS)
500.0000 mL | Freq: Once | INTRAVENOUS | Status: AC
  Administered 2017-02-07: 500 mL via INTRAVENOUS

## 2017-02-07 MED ORDER — ACETAMINOPHEN 500 MG PO TABS
1000.0000 mg | ORAL_TABLET | ORAL | Status: DC | PRN
  Administered 2017-02-07: 1000 mg via ORAL
  Filled 2017-02-07: qty 2

## 2017-02-07 NOTE — Progress Notes (Signed)
PROGRESS NOTE  Eric Cameron ZOX:[id number] DOB: July 06, 1934 DOA: 02/01/2017 PCP: No primary care provider on file.  HPI/Recap of past 24 hours: Mr. Kise is a 82 year old male with medical history significant for dementia, type 2 diabetes, CVA who presented on 02/01/2017 with weakness and AMS per family report.  On arrival to the ED, glucose greater than 500, lactic acid, bicarb 13, creatinine 3, BUN 115, anion gap 8, Cl 123, sodium 146.  Found to have greater than 100,000 colonies on urine for moderate, few bacteria culture, no ketones. Chest x-ray on admission showed no pulmonary abnormalities.   Patient was admitted for sepsis UTI 2/2 yeast. Also treated for presumed DKA likely secondary to infection and acute renal failure in setting of dehydration( poor PO intake and DKA) on admission.  Initially empirically started on (given ceftriaxone in ED) vancomycin and ceftriaxone given profound sepsis plus lactic acidosis and hypotension.  Urine culture showed yeast, Diflucan started 10/19. Zosyn continued for bacterial coverage given persistent fever, tachycardia and hypotension.  Patient much more interactive today.  More conversant.  Endorses that he would like to eat.  Continues to deny any pain.  Assessment/Plan: Principal Problem:   Severe sepsis (HCC) Active Problems:   HTN (hypertension)   Seizures (HCC)   Alzheimer's dementia without behavioral disturbance   UTI (urinary tract infection)   Acute renal failure (ARF) (HCC)   DKA (diabetic ketoacidosis) (HCC)   Protein-calorie malnutrition, severe   Palliative care encounter   Goals of care, counseling/discussion   Pressure injury of skin   Severe protein-calorie malnutrition (HCC)  Severe Sepsis secondary to UTI, etiology yeast, sepsis improving Septic Shock, resolved -Blood pressure maintaining without IV steroids. -Afebrile greater than 24 hours, leukocytosis stable, clinically patient more interactive - Continue Diflucan  and Zosyn (day 6) - Blood pressure somewhat soft with systolics in the 90s, will give 500 cc bolus, start gentle IV fluids for 12 hours  T2DM, poorly controlled hyperglycemia, improving Initial concern for DKA on admission (glucose >500, bicarb 13, no urine ketones). However non anion gap hyperchloremic metabolic acidosis likely related to acute renal failure -lantus 10  and SSI, may need to increase Lantus as appetite increases  Acute renal failure, multifactorial etiology, likely pre-renal ( poor PO intake and sepsis) Non anion gap metabolic acidosis, improving Admission Cr 3. Baseline 0.8-0.9. Currently at baseline Co2 currently 20(improved from 12 on admission) Improving with fluid resuscitation and antibiotics, -Gentle IV fluids timed for 12 hours -Monitor I/O, avoid nephrotoxins   Hypovolemic Hypernatremia, improving In setting of profound dehydration (poor po intake) and hypotension(severe sepsis) Currently 142 ( peak 153) -improved with fluid resuscitation, on D5% 75 cc - monitor BMP  Goals of Care Sister who reiterates he is DNR/DNI with full scope treatment Significant clinical improvement today, patient walking and eating on the floor. - Palliative care will follow at a distance, outpatient f/u recs:   - if D/c to home:  Please place case manager consult for Palliative at home  - if D/C to SNF:  Please include in D/C instructions:  "Palliative to follow at SNF" -symptomatic pain control ( hold for systolic blood pressure< 90) with PRN morphine   Alzheimer's Dementia, advanced Per sister baseline prior to hospitalization was living with his sister. Able to walk and eat  Per chart review patient has not been on Aricept/donepeizil before ( unclear if unknown contraindication) -last evaluated by neurology 03/2016 deemed to have substantial cognitive loss   Stage 1 Pressure Ulcer, present on admission  Bandages applied to hip and lower sacrum   Code Status:  DNR/DNI  Family Communication: No family at bedside, Dunnavant Callas is sister  Disposition Plan: clinically improving, de-escalation of antibiotics  Consultants:  Palliative Care  Procedures:  None  Antimicrobials:  Vancomycin 10/18-10/19  Zosyn 10/17-Current  Diflucan 10/19-Current   DVT prophylaxis:  Enoxaparin   Objective: Vitals:   02/07/17 1222 02/07/17 1612 02/07/17 1711 02/07/17 2033  BP: (!) 109/54  (!) 99/49 (!) 91/48  Pulse: 69 81 62 (!) 58  Resp: (!) 21 (!) 23 16 16   Temp: (!) 97.5 F (36.4 C) 97.7 F (36.5 C) 98.3 F (36.8 C) 98 F (36.7 C)  TempSrc: Oral Axillary  Oral  SpO2: 100% 100% 100% 100%  Weight:   46.7 kg (103 lb)   Height:   5\' 5"  (1.651 m)     Intake/Output Summary (Last 24 hours) at 02/07/17 2141 Last data filed at 02/07/17 1844  Gross per 24 hour  Intake              825 ml  Output              525 ml  Net              300 ml   Filed Weights   02/01/17 2300 02/01/17 2326 02/07/17 1711  Weight: 47.8 kg (105 lb 6.1 oz) 47.8 kg (105 lb 6.1 oz) 46.7 kg (103 lb)    Exam:   General: Lying in bed in usual fetal position, however during conversation turns toward me appropriately much improved from previous exams, thin, very cachetic older gentleman  Respiratory: no wheezes, rhonchi, or rales on exam, on room air   Neurologic: Oriented to person, place ( named hospital), time ( only season, not able to name month or year), (able to name sister), moving extremities which is a significant improvement from prior exams as he is not staying only in a fetal position  Skin: dry, intact     Data Reviewed: CBC:  Recent Labs Lab 02/01/17 1640 02/01/17 2053 02/03/17 0729 02/04/17 0551 02/06/17 0731 02/07/17 0817  WBC 27.3*  --  17.3* 12.8* 14.2* 14.3*  NEUTROABS 24.5*  --   --   --   --   --   HGB 8.2* 9.9* 8.0* 7.8* 7.4* 7.1*  HCT 26.6* 29.0* 26.1* 25.3* 24.7* 22.4*  MCV 86.4  --  85.6 86.1 85.8 84.8  PLT 613*  --  537* 457*  415* 373   Basic Metabolic Panel:  Recent Labs Lab 02/03/17 0729 02/04/17 0551 02/06/17 0731 02/07/17 0817 02/07/17 1813  NA 153* 149* 142 142 142  K 4.5 3.5 3.2* 2.7* 3.0*  CL 128* 125* 114* 117* 116*  CO2 15* 16* 17* 17* 20*  GLUCOSE 123* 106* 147* 155* 271*  BUN 69* 60* 38* 32* 26*  CREATININE 1.51* 1.56* 1.04 0.99 0.86  CALCIUM 10.3 9.6 8.6* 8.5* 8.4*   GFR: Estimated Creatinine Clearance: 45.3 mL/min (by C-G formula based on SCr of 0.86 mg/dL). Liver Function Tests:  Recent Labs Lab 02/01/17 1640 02/03/17 0729  AST 17 25  ALT 19 19  ALKPHOS 81 63  BILITOT 0.3 0.5  PROT 9.0* 7.7  ALBUMIN 2.1* 1.8*   No results for input(s): LIPASE, AMYLASE in the last 168 hours. No results for input(s): AMMONIA in the last 168 hours. Coagulation Profile:  Recent Labs Lab 02/01/17 1640  INR 1.27   Cardiac Enzymes: No results for input(s): CKTOTAL, CKMB, CKMBINDEX,  TROPONINI in the last 168 hours. BNP (last 3 results) No results for input(s): PROBNP in the last 8760 hours. HbA1C: No results for input(s): HGBA1C in the last 72 hours. CBG:  Recent Labs Lab 02/07/17 0317 02/07/17 0805 02/07/17 1219 02/07/17 1550 02/07/17 2030  GLUCAP 226* 161* 227* 261* 154*   Lipid Profile: No results for input(s): CHOL, HDL, LDLCALC, TRIG, CHOLHDL, LDLDIRECT in the last 72 hours. Thyroid Function Tests: No results for input(s): TSH, T4TOTAL, FREET4, T3FREE, THYROIDAB in the last 72 hours. Anemia Panel: No results for input(s): VITAMINB12, FOLATE, FERRITIN, TIBC, IRON, RETICCTPCT in the last 72 hours. Urine analysis:    Component Value Date/Time   COLORURINE YELLOW 02/01/2017 1728   APPEARANCEUR TURBID (A) 02/01/2017 1728   LABSPEC 1.013 02/01/2017 1728   PHURINE 5.0 02/01/2017 1728   GLUCOSEU NEGATIVE 02/01/2017 1728   HGBUR MODERATE (A) 02/01/2017 1728   BILIRUBINUR NEGATIVE 02/01/2017 1728   KETONESUR NEGATIVE 02/01/2017 1728   PROTEINUR 100 (A) 02/01/2017 1728    UROBILINOGEN 1.0 11/30/2013 1734   NITRITE NEGATIVE 02/01/2017 1728   LEUKOCYTESUR MODERATE (A) 02/01/2017 1728   Sepsis Labs: @LABRCNTIP (procalcitonin:4,lacticidven:4)  ) Recent Results (from the past 240 hour(s))  Culture, blood (Routine x 2)     Status: None   Collection Time: 02/01/17  4:40 PM  Result Value Ref Range Status   Specimen Description BLOOD RIGHT HAND  Final   Special Requests   Final    BOTTLES DRAWN AEROBIC ONLY Blood Culture adequate volume   Culture NO GROWTH 5 DAYS  Final   Report Status 02/06/2017 FINAL  Final  Urine Culture     Status: Abnormal   Collection Time: 02/01/17  5:28 PM  Result Value Ref Range Status   Specimen Description URINE, CATHETERIZED  Final   Special Requests NONE  Final   Culture >=100,000 COLONIES/mL YEAST (A)  Final   Report Status 02/02/2017 FINAL  Final  Culture, blood (Routine x 2)     Status: Abnormal   Collection Time: 02/01/17  6:05 PM  Result Value Ref Range Status   Specimen Description BLOOD RIGHT ANTECUBITAL  Final   Special Requests IN PEDIATRIC BOTTLE Blood Culture adequate volume  Final   Culture  Setup Time   Final    GRAM POSITIVE COCCI IN CLUSTERS IN PEDIATRIC BOTTLE CRITICAL RESULT CALLED TO, READ BACK BY AND VERIFIED WITH: A JOHNSTON PHARMD 2239 02/02/17 A BROWNING    Culture (A)  Final    STAPHYLOCOCCUS SPECIES (COAGULASE NEGATIVE) THE SIGNIFICANCE OF ISOLATING THIS ORGANISM FROM A SINGLE SET OF BLOOD CULTURES WHEN MULTIPLE SETS ARE DRAWN IS UNCERTAIN. PLEASE NOTIFY THE MICROBIOLOGY DEPARTMENT WITHIN ONE WEEK IF SPECIATION AND SENSITIVITIES ARE REQUIRED.    Report Status 02/03/2017 FINAL  Final  Blood Culture ID Panel (Reflexed)     Status: None   Collection Time: 02/01/17  6:05 PM  Result Value Ref Range Status   Enterococcus species NOT DETECTED NOT DETECTED Final   Listeria monocytogenes NOT DETECTED NOT DETECTED Final   Staphylococcus species NOT DETECTED NOT DETECTED Final   Staphylococcus aureus NOT  DETECTED NOT DETECTED Final   Streptococcus species NOT DETECTED NOT DETECTED Final   Streptococcus agalactiae NOT DETECTED NOT DETECTED Final   Streptococcus pneumoniae NOT DETECTED NOT DETECTED Final   Streptococcus pyogenes NOT DETECTED NOT DETECTED Final   Acinetobacter baumannii NOT DETECTED NOT DETECTED Final   Enterobacteriaceae species NOT DETECTED NOT DETECTED Final   Enterobacter cloacae complex NOT DETECTED NOT DETECTED Final  Escherichia coli NOT DETECTED NOT DETECTED Final   Klebsiella oxytoca NOT DETECTED NOT DETECTED Final   Klebsiella pneumoniae NOT DETECTED NOT DETECTED Final   Proteus species NOT DETECTED NOT DETECTED Final   Serratia marcescens NOT DETECTED NOT DETECTED Final   Haemophilus influenzae NOT DETECTED NOT DETECTED Final   Neisseria meningitidis NOT DETECTED NOT DETECTED Final   Pseudomonas aeruginosa NOT DETECTED NOT DETECTED Final   Candida albicans NOT DETECTED NOT DETECTED Final   Candida glabrata NOT DETECTED NOT DETECTED Final   Candida krusei NOT DETECTED NOT DETECTED Final   Candida parapsilosis NOT DETECTED NOT DETECTED Final   Candida tropicalis NOT DETECTED NOT DETECTED Final  MRSA PCR Screening     Status: None   Collection Time: 02/01/17 11:15 PM  Result Value Ref Range Status   MRSA by PCR NEGATIVE NEGATIVE Final    Comment:        The GeneXpert MRSA Assay (FDA approved for NASAL specimens only), is one component of a comprehensive MRSA colonization surveillance program. It is not intended to diagnose MRSA infection nor to guide or monitor treatment for MRSA infections.       Studies: No results found.  Scheduled Meds: . enoxaparin (LOVENOX) injection  40 mg Subcutaneous Daily  . insulin aspart  0-9 Units Subcutaneous Q4H  . insulin glargine  10 Units Subcutaneous Daily  . mouth rinse  15 mL Mouth Rinse BID    Continuous Infusions: . sodium chloride    . fluconazole (DIFLUCAN) IV Stopped (02/07/17 1051)  .  piperacillin-tazobactam (ZOSYN)  IV 3.375 g (02/07/17 1844)     LOS: 6 days     Laverna PeaceShayla D Marchetta Navratil, MD [facility] Pager [phone]  If 7PM-7AM, please contact night-coverage [email or web address] Password [email or web address] 02/07/2017, 9:41 PM

## 2017-02-07 NOTE — Progress Notes (Addendum)
Patient trasfered from 70M to [phone] via bed; alert and oriented x 1; no complaints of pain; IV saline locked in LFA and LAC; patient has old pressure ulcers on both hips that are healed - foam dressing for protection on both hips and shoulders and sacrum. Orient patient to room and unit;instructed how to use the call bell and  fall risk precautions. Will continue to monitor the patient.

## 2017-02-07 NOTE — Progress Notes (Signed)
Physical Therapy Treatment Patient Details Name: Eric CopaRobert Deegan MRN: [id number] DOB: 12/22/1934 Today's Date: 02/07/2017    History of Present Illness Pt adm with sepsis due to UTI. PMH - dementia, DM, CVA    PT Comments    Pt making good progress with mobility. Continue to recommend SNF since doubt sister can provide physical assist at this level.   Follow Up Recommendations  SNF     Equipment Recommendations  None recommended by PT    Recommendations for Other Services       Precautions / Restrictions Precautions Precautions: Fall    Mobility  Bed Mobility Overal bed mobility: Needs Assistance Bed Mobility: Supine to Sit     Supine to sit: Min assist     General bed mobility comments: Assist to elevate trunk into sitting  Transfers Overall transfer level: Needs assistance Equipment used: 1 person hand held assist Transfers: Sit to/from Stand Sit to Stand: Mod assist         General transfer comment: Assist to bring hips up and for balance.   Ambulation/Gait Ambulation/Gait assistance: Mod assist;Min assist;+2 safety/equipment Ambulation Distance (Feet): 150 Feet Assistive device: 1 person hand held assist Gait Pattern/deviations: Step-through pattern;Decreased step length - right;Decreased step length - left;Trunk flexed;Narrow base of support;Drifts right/left;Scissoring Gait velocity: decr Gait velocity interpretation: Below normal speed for age/gender General Gait Details: Assist for balance and support. Pt with bil knee flexion throughout. Amount of assist decr from mod to min as distance increased   Stairs            Wheelchair Mobility    Modified Rankin (Stroke Patients Only)       Balance Overall balance assessment: Needs assistance Sitting-balance support: No upper extremity supported;Feet supported Sitting balance-Leahy Scale: Fair     Standing balance support: Single extremity supported Standing balance-Leahy Scale:  Poor Standing balance comment: min assist for static standing                            Cognition Arousal/Alertness: Awake/alert Behavior During Therapy: WFL for tasks assessed/performed Overall Cognitive Status: No family/caregiver present to determine baseline cognitive functioning                                 General Comments: dementia at baseline. Pt following 1 step commands and communicating some      Exercises      General Comments        Pertinent Vitals/Pain Pain Assessment: Faces Faces Pain Scale: No hurt    Home Living                      Prior Function            PT Goals (current goals can now be found in the care plan section) Progress towards PT goals: Progressing toward goals    Frequency    Min 3X/week      PT Plan Current plan remains appropriate    Co-evaluation              AM-PAC PT "6 Clicks" Daily Activity  Outcome Measure  Difficulty turning over in bed (including adjusting bedclothes, sheets and blankets)?: A Little Difficulty moving from lying on back to sitting on the side of the bed? : Unable Difficulty sitting down on and standing up from a chair with arms (e.g., wheelchair, bedside commode, etc,.)?:  Unable Help needed moving to and from a bed to chair (including a wheelchair)?: A Lot Help needed walking in hospital room?: A Lot Help needed climbing 3-5 steps with a railing? : Total 6 Click Score: 10    End of Session Equipment Utilized During Treatment: Gait belt Activity Tolerance: Patient tolerated treatment well Patient left: in chair;with call bell/phone within reach;with chair alarm set Nurse Communication: Mobility status PT Visit Diagnosis: Unsteadiness on feet (R26.81);Other abnormalities of gait and mobility (R26.89);Muscle weakness (generalized) (M62.81)     Time: 2952-8413 PT Time Calculation (min) (ACUTE ONLY): 18 min  Charges:  $Gait Training: 8-22 mins                     G Codes:       [facility] PT [phone]    Angelina Ok [facility] 02/07/2017, 3:50 PM

## 2017-02-07 NOTE — Progress Notes (Signed)
CRITICAL VALUE ALERT  Critical Value:  K 2.7 Date & Time Notied: 02/07/17 [id number]   Provider Notified: Dr. Caleb PoppNettey  Orders Received/Actions taken: pending

## 2017-02-07 NOTE — Progress Notes (Signed)
[facility] and patient being transferred to 5w-rm22 with all of his belonging in a patient belongings bag.

## 2017-02-07 NOTE — Progress Notes (Signed)
Sister [name] notified of transfer to 5w-rm22, all questions and concerns answered.

## 2017-02-07 NOTE — Progress Notes (Addendum)
Received report from bedside RN in [place]70[place].  Patient walked in the hallway today.  Examined patient.  Lying on his side in recliner chair.  He smiles at me and then grimaces. He denies any complaint but when I question him further about his grimace he admits to back pain. Patient wants to d/c to home with sister at time of discharge.  Placed an order for extra strength tylenol PRN pain/fever.  Assessment:  Patient is demented but walking and eating (somewhat).  If his eating increases he will not be hospice eligible.   Current albumin is 1.8.  We would recommend D/C to sisters home or SNF with Palliative to follow outpatient.  D/c to home:  Please place case manager consult for Palliative at home D/C to SNF:  Please include in D/C instructions:  "Palliative to follow at SNF"  PMT will follow at a distance.  Please call us back sooner if we can be of assistance.  Eric RichardsMarianne Laylah Riga, PA-C Palliative Medicine Pager: [phone]  Total time 15 min.

## 2017-02-07 NOTE — Progress Notes (Signed)
CRITICAL VALUE ALERT  Critical Value:  K 2.7  Date & Time Notied:  02/07/17, 10:30am   Provider Notified: S. Netty   Orders Received/Actions taken: orders given and carried out

## 2017-02-08 DIAGNOSIS — E162 Hypoglycemia, unspecified: Secondary | ICD-10-CM

## 2017-02-08 LAB — BASIC METABOLIC PANEL
ANION GAP: 8 (ref 5–15)
BUN: 19 mg/dL (ref 6–20)
CALCIUM: 8.3 mg/dL — AB (ref 8.9–10.3)
CO2: 22 mmol/L (ref 22–32)
CREATININE: 0.7 mg/dL (ref 0.61–1.24)
Chloride: 115 mmol/L — ABNORMAL HIGH (ref 101–111)
Glucose, Bld: 84 mg/dL (ref 65–99)
Potassium: 2.7 mmol/L — CL (ref 3.5–5.1)
SODIUM: 145 mmol/L (ref 135–145)

## 2017-02-08 LAB — CREATININE, SERUM
Creatinine, Ser: 0.73 mg/dL (ref 0.61–1.24)
GFR calc Af Amer: >60 mL/min (ref >60)
GFR calc non Af Amer: >60 mL/min (ref >60)

## 2017-02-08 LAB — CBC
HCT: 25.6 % — ABNORMAL LOW (ref 39.0–52.0)
Hemoglobin: 7.8 g/dL — ABNORMAL LOW (ref 13.0–17.0)
MCH: 26.1 pg (ref 26.0–34.0)
MCHC: 30.5 g/dL (ref 30.0–36.0)
MCV: 85.6 fL (ref 78.0–100.0)
PLATELETS: 421 10*3/uL — AB (ref 150–400)
RBC: 2.99 MIL/uL — ABNORMAL LOW (ref 4.22–5.81)
RDW: 15.5 % (ref 11.5–15.5)
WBC: 16 10*3/uL — AB (ref 4.0–10.5)

## 2017-02-08 LAB — GLUCOSE, CAPILLARY
GLUCOSE-CAPILLARY: 166 mg/dL — AB (ref 65–99)
GLUCOSE-CAPILLARY: 206 mg/dL — AB (ref 65–99)
GLUCOSE-CAPILLARY: 77 mg/dL (ref 65–99)
GLUCOSE-CAPILLARY: 83 mg/dL (ref 65–99)
GLUCOSE-CAPILLARY: 98 mg/dL (ref 65–99)
GLUCOSE-CAPILLARY: 98 mg/dL (ref 65–99)
Glucose-Capillary: 43 mg/dL — CL (ref 65–99)
Glucose-Capillary: 66 mg/dL (ref 65–99)

## 2017-02-08 LAB — MAGNESIUM: MAGNESIUM: 1.5 mg/dL — AB (ref 1.7–2.4)

## 2017-02-08 MED ORDER — MAGNESIUM SULFATE 2 GM/50ML IV SOLN
2.0000 g | Freq: Once | INTRAVENOUS | Status: AC
  Administered 2017-02-08: 2 g via INTRAVENOUS
  Filled 2017-02-08: qty 50

## 2017-02-08 MED ORDER — DEXTROSE-NACL 5-0.9 % IV SOLN
INTRAVENOUS | Status: DC
  Administered 2017-02-08: 07:00:00 via INTRAVENOUS

## 2017-02-08 MED ORDER — DEXTROSE 50 % IV SOLN
INTRAVENOUS | Status: AC
  Administered 2017-02-08: 25 mL
  Filled 2017-02-08: qty 50

## 2017-02-08 MED ORDER — ENOXAPARIN SODIUM 30 MG/0.3ML ~~LOC~~ SOLN
30.0000 mg | Freq: Every day | SUBCUTANEOUS | Status: DC
  Administered 2017-02-09 – 2017-02-11 (×3): 30 mg via SUBCUTANEOUS
  Filled 2017-02-08 (×3): qty 0.3

## 2017-02-08 MED ORDER — DEXTROSE 50 % IV SOLN
INTRAVENOUS | Status: AC
  Administered 2017-02-08: 50 mL
  Filled 2017-02-08: qty 50

## 2017-02-08 MED ORDER — POTASSIUM CHLORIDE 10 MEQ/100ML IV SOLN
10.0000 meq | INTRAVENOUS | Status: AC
  Administered 2017-02-08 (×6): 10 meq via INTRAVENOUS
  Filled 2017-02-08 (×6): qty 100

## 2017-02-08 NOTE — Progress Notes (Signed)
Hypoglycemic Event  CBG: 43  Treatment: D50 IV 25 mL  Symptoms: None  Follow-up CBG: Time: 0057 CBG Result: 98  Possible Reasons for Event: Inadequate meal intake  Comments/MD notified: Toniann FailKakrakandy, MD    Eric Cameron

## 2017-02-08 NOTE — Progress Notes (Signed)
Paged provider that patient had 3 bowel movements today.

## 2017-02-08 NOTE — Progress Notes (Signed)
CRITICAL VALUE STICKER  CRITICAL VALUE: k 2.7  RECEIVER (on-site recipient of call): Saliha Salts  DATE & TIME NOTIFIED: 02/08/17 @ 9:13 am   MD NOTIFIED: Alvino Chapelchoi   TIME OF NOTIFICATION:02/08/17 @ 9:14 am

## 2017-02-08 NOTE — Progress Notes (Signed)
Hypoglycemic Event  CBG: 66  Treatment: D50 IV 25 mL  Symptoms: None  Follow-up CBG: Time:4:57am CBG Result: 98  Possible Reasons for Event: Inadequate meal intake  Comments/MD notified:Kakrakandy, MD    Allan Minotti T Kache Mcclurg

## 2017-02-08 NOTE — Progress Notes (Signed)
Daily Progress Note   Patient Name: Eric Cameron       Date: 02/08/2017 DOB: 1934/10/03  Age: 82 y.o. MRN#: [id number] Attending Physician: Leda Minhoi, Jennifer Chahn-Yan* Primary Care Physician: No primary care provider on file. Admit Date: 02/01/2017  Reason for Consultation/Follow-up: Establishing goals of care  Subjective: Low CBGs over night requiring D50.  Patient eating breakfast this am.  Denies pain.  Pleasantly demented.   Assessment: Sepsis resolved.  Patient walking with assistance.  CBGs dropped overnight requiring D50 and D5 infusion.   Length of Stay: 7  Current Medications: Scheduled Meds:  . enoxaparin (LOVENOX) injection  40 mg Subcutaneous Daily  . insulin aspart  0-9 Units Subcutaneous Q4H  . insulin glargine  10 Units Subcutaneous Daily  . mouth rinse  15 mL Mouth Rinse BID    Continuous Infusions: . sodium chloride 75 mL/hr at 02/08/17 0640  . dextrose 5 % and 0.9% NaCl 50 mL/hr at 02/08/17 0639  . fluconazole (DIFLUCAN) IV Stopped (02/07/17 1051)  . piperacillin-tazobactam (ZOSYN)  IV 3.375 g (02/08/17 0247)    PRN Meds: acetaminophen, feeding supplement (GLUCERNA SHAKE), morphine CONCENTRATE  Physical Exam        Thin, frail, pleasantly demented.  Eating breakfast. Smiling.  Verbal.  Vital Signs: BP (!) 107/53 (BP Location: Right Arm)   Pulse 64   Temp 98.1 F (36.7 C) (Oral)   Resp 17   Ht 5\' 5"  (1.651 m)   Wt 46.7 kg (103 lb)   SpO2 100%   BMI 17.14 kg/m  SpO2: SpO2: 100 % O2 Device: O2 Device: Not Delivered O2 Flow Rate: O2 Flow Rate (L/min): 3 L/min  Intake/output summary:  Intake/Output Summary (Last 24 hours) at 02/08/17 0813 Last data filed at 02/08/17 [id number]  Gross per 24 hour  Intake           661.67 ml  Output             1725  ml  Net         -1063.33 ml   LBM: Last BM Date: 02/07/17 Baseline Weight: Weight: 47.8 kg (105 lb 6.1 oz) Most recent weight: Weight: 46.7 kg (103 lb)       Palliative Assessment/Data: 40%      Patient Active Problem List   Diagnosis Date Noted  . Severe  protein-calorie malnutrition (HCC)   . Pressure injury of skin 02/05/2017  . Palliative care encounter   . Goals of care, counseling/discussion   . Protein-calorie malnutrition, severe 02/04/2017  . Severe sepsis (HCC) 02/01/2017  . DKA (diabetic ketoacidosis) (HCC) 02/01/2017  . Acute renal failure (ARF) (HCC) 06/08/2016  . Elevated lactic acid level 06/08/2016  . Sepsis (HCC) 05/28/2016  . UTI (urinary tract infection) 05/28/2016  . Alzheimer's dementia without behavioral disturbance 04/10/2016  . Fungemia 07/18/2015  . Leukocytosis 07/18/2015  . Pressure ulcer of hip 07/06/2015  . PNA (pneumonia) 07/06/2015  . Elevated troponin 07/01/2015  . Dehydration 07/01/2015  . Seizures (HCC) 12/03/2013  . H/O: CVA (cerebrovascular accident) 12/01/2013  . Altered mental status 11/30/2013  . Acute encephalopathy 11/30/2013  . HTN (hypertension) 11/30/2013  . Diabetes mellitus type 2, uncontrolled (HCC) 11/30/2013  . HLD (hyperlipidemia) 11/30/2013    Palliative Care Plan    Recommendations/Plan:  Regular diet.  D/C D5 to determine if he can maintain his blood sugar.  If he does not maintain blood sugar recommend Hospice  If he does maintain blood sugar then SNF vs home with Palliative Follow up at either.  Goals of Care and Additional Recommendations:  Limitations on Scope of Treatment: Full Scope Treatment  Code Status:  DNR  Prognosis:   Unable to determine pending his PO intake.   Discharge Planning:  To Be Determined  Home vs SNF  Care plan was discussed with - phone call to sister.  Thank you for allowing the Palliative Medicine Team to assist in the care of this patient.  Total time spent:  15  min.     Greater than 50%  of this time was spent counseling and coordinating care related to the above assessment and plan.  Norvel Richards, PA-C Palliative Medicine  Please contact Palliative MedicineTeam phone at [phone] for questions and concerns between 7 am - 7 pm.   Please see AMION for individual provider pager numbers.

## 2017-02-08 NOTE — Progress Notes (Addendum)
PROGRESS NOTE    Eric Cameron  WUJ:[id number] DOB: 04/18/1935 DOA: 02/01/2017 PCP: No primary care provider on file.     Brief Narrative:  Eric Cameron is a 82 yo male with past medical history of dementia, type 2 diabetes, CVA who presented with chief complaint of weakness and alter mentation per family. , He was found to have greater than 100,000 colonies for yeast in his urine. He was admitted for sepsis secondary to UTI as well as presumed DKA (with hyperglycemia, although anion gap was only 8). . He was empirically started on vancomycin and Rocephin, which was transitioned to Diflucan. Zosyn was started due to persistent fever, tachycardia and hypotension.  Assessment & Plan:   Principal Problem:   Severe sepsis (HCC) Active Problems:   HTN (hypertension)   Seizures (HCC)   Alzheimer's dementia without behavioral disturbance   UTI (urinary tract infection)   Acute renal failure (ARF) (HCC)   DKA (diabetic ketoacidosis) (HCC)   Protein-calorie malnutrition, severe   Palliative care encounter   Goals of care, counseling/discussion   Pressure injury of skin   Severe protein-calorie malnutrition (HCC)   Hypoglycemia   Sepsis secondary to UTI -Urine culture showed >100,000 yeast - ?colonizer -Blood culture with 1 of 2 coag neg staph - contaminant  -BP remains stable  -Diflucan x 14 days  -Will stop zosyn today, unclear any bacterial source of infection. If patient becomes febrile, will need pan-culture   Hypokalemia -Replace, trend   Hypomagnesemia -Replace, trend   Type 2 diabetes with hyperglycemia -Lantus and sliding scale insulin  Acute kidney injury -Likely prerenal in etiology -Baseline creatinine 0.8-0.9 -Resolved back to normal   Hypovolemic hypernatremia -Resolved   Advanced Alzheimer dementia -Supportive care  Stage I pressure ulcer on lower sacrum, present on admission -Supportive care  Seizures -No longer on antiepileptics -Monitor      DVT prophylaxis: lovenox  Code Status: DNR Family Communication: No family at bedside Disposition Plan: Hospice vs SNF with palliative care   Consultants:   Palliative care  Procedures:   None   Antimicrobials:  Anti-infectives    Start     Dose/Rate Route Frequency Ordered Stop   02/04/17 0000  vancomycin (VANCOCIN) 500 mg in sodium chloride 0.9 % 100 mL IVPB  Status:  Discontinued     500 mg 100 mL/hr over 60 Minutes Intravenous Every 48 hours 02/01/17 2350 02/02/17 1028   02/03/17 0800  fluconazole (DIFLUCAN) IVPB 100 mg     100 mg 50 mL/hr over 60 Minutes Intravenous Every 24 hours 02/03/17 0702     02/03/17 0500  vancomycin (VANCOCIN) 500 mg in sodium chloride 0.9 % 100 mL IVPB  Status:  Discontinued     500 mg 100 mL/hr over 60 Minutes Intravenous Every 24 hours 02/02/17 1028 02/03/17 1354   02/02/17 1830  cefTRIAXone (ROCEPHIN) 1 g in dextrose 5 % 50 mL IVPB  Status:  Discontinued     1 g 100 mL/hr over 30 Minutes Intravenous Every 24 hours 02/01/17 2233 02/01/17 2253   02/02/17 1600  piperacillin-tazobactam (ZOSYN) IVPB 3.375 g  Status:  Discontinued     3.375 g 12.5 mL/hr over 240 Minutes Intravenous Every 8 hours 02/02/17 1028 02/08/17 1504   02/02/17 0000  vancomycin (VANCOCIN) IVPB 1000 mg/200 mL premix     1,000 mg 200 mL/hr over 60 Minutes Intravenous  Once 02/01/17 2350 02/02/17 0125   02/02/17 0000  piperacillin-tazobactam (ZOSYN) IVPB 2.25 g  Status:  Discontinued  2.25 g 100 mL/hr over 30 Minutes Intravenous Every 8 hours 02/01/17 2350 02/02/17 1028   02/01/17 1745  cefTRIAXone (ROCEPHIN) 2 g in dextrose 5 % 50 mL IVPB     2 g 100 mL/hr over 30 Minutes Intravenous  Once 02/01/17 1738 02/01/17 2001       Subjective: Patient with dementia, unable to participate in meaningful history. No complaints or acute events   Objective: Vitals:   02/07/17 1711 02/07/17 2033 02/08/17 0632 02/08/17 1420  BP: (!) 99/49 (!) 91/48 (!) 107/53 109/86   Pulse: 62 (!) 58 64 78  Resp: 16 16 17 16   Temp: 98.3 F (36.8 C) 98 F (36.7 C) 98.1 F (36.7 C) 98 F (36.7 C)  TempSrc:  Oral Oral Oral  SpO2: 100% 100% 100% 98%  Weight: 46.7 kg (103 lb)     Height: 5\' 5"  (1.651 m)       Intake/Output Summary (Last 24 hours) at 02/08/17 1505 Last data filed at 02/08/17 1421  Gross per 24 hour  Intake          1019.67 ml  Output             2400 ml  Net         -1380.33 ml   Filed Weights   02/01/17 2300 02/01/17 2326 02/07/17 1711  Weight: 47.8 kg (105 lb 6.1 oz) 47.8 kg (105 lb 6.1 oz) 46.7 kg (103 lb)    Examination:  General exam: Appears calm and comfortable appearing  Respiratory system: Clear to auscultation. Respiratory effort normal. Cardiovascular system: S1 & S2 heard, RRR. No JVD, murmurs, rubs, gallops or clicks. No pedal edema. Gastrointestinal system: Abdomen is nondistended, soft and nontender. No organomegaly or masses felt. Normal bowel sounds heard. Central nervous system: Alert, poor historian  Extremities: Symmetric Psychiatry: +dementia   Data Reviewed: I have personally reviewed following labs and imaging studies  CBC:  Recent Labs Lab 02/01/17 1640  02/03/17 0729 02/04/17 0551 02/06/17 0731 02/07/17 0817 02/08/17 0824  WBC 27.3*  --  17.3* 12.8* 14.2* 14.3* 16.0*  NEUTROABS 24.5*  --   --   --   --   --   --   HGB 8.2*  < > 8.0* 7.8* 7.4* 7.1* 7.8*  HCT 26.6*  < > 26.1* 25.3* 24.7* 22.4* 25.6*  MCV 86.4  --  85.6 86.1 85.8 84.8 85.6  PLT 613*  --  537* 457* 415* 373 421*  < > = values in this interval not displayed. Basic Metabolic Panel:  Recent Labs Lab 02/04/17 0551 02/06/17 0731 02/07/17 0817 02/07/17 1813 02/08/17 0727 02/08/17 0824 02/08/17 0950  NA 149* 142 142 142  --  145  --   K 3.5 3.2* 2.7* 3.0*  --  2.7*  --   CL 125* 114* 117* 116*  --  115*  --   CO2 16* 17* 17* 20*  --  22  --   GLUCOSE 106* 147* 155* 271*  --  84  --   BUN 60* 38* 32* 26*  --  19  --   CREATININE 1.56*  1.04 0.99 0.86 0.73 0.70  --   CALCIUM 9.6 8.6* 8.5* 8.4*  --  8.3*  --   MG  --   --   --   --   --   --  1.5*   GFR: Estimated Creatinine Clearance: 48.6 mL/min (by C-G formula based on SCr of 0.7 mg/dL). Liver Function Tests:  Recent Labs  Lab 02/01/17 1640 02/03/17 0729  AST 17 25  ALT 19 19  ALKPHOS 81 63  BILITOT 0.3 0.5  PROT 9.0* 7.7  ALBUMIN 2.1* 1.8*   No results for input(s): LIPASE, AMYLASE in the last 168 hours. No results for input(s): AMMONIA in the last 168 hours. Coagulation Profile:  Recent Labs Lab 02/01/17 1640  INR 1.27   Cardiac Enzymes: No results for input(s): CKTOTAL, CKMB, CKMBINDEX, TROPONINI in the last 168 hours. BNP (last 3 results) No results for input(s): PROBNP in the last 8760 hours. HbA1C: No results for input(s): HGBA1C in the last 72 hours. CBG:  Recent Labs Lab 02/08/17 0057 02/08/17 0429 02/08/17 0457 02/08/17 0730 02/08/17 1214  GLUCAP 98 66 98 77 166*   Lipid Profile: No results for input(s): CHOL, HDL, LDLCALC, TRIG, CHOLHDL, LDLDIRECT in the last 72 hours. Thyroid Function Tests: No results for input(s): TSH, T4TOTAL, FREET4, T3FREE, THYROIDAB in the last 72 hours. Anemia Panel: No results for input(s): VITAMINB12, FOLATE, FERRITIN, TIBC, IRON, RETICCTPCT in the last 72 hours. Sepsis Labs:  Recent Labs Lab 02/01/17 1649 02/01/17 2054 02/02/17 0024 02/02/17 0256  PROCALCITON  --   --  3.23  --   LATICACIDVEN 3.18* 4.05* 2.6* 2.7*    Recent Results (from the past 240 hour(s))  Culture, blood (Routine x 2)     Status: None   Collection Time: 02/01/17  4:40 PM  Result Value Ref Range Status   Specimen Description BLOOD RIGHT HAND  Final   Special Requests   Final    BOTTLES DRAWN AEROBIC ONLY Blood Culture adequate volume   Culture NO GROWTH 5 DAYS  Final   Report Status 02/06/2017 FINAL  Final  Urine Culture     Status: Abnormal   Collection Time: 02/01/17  5:28 PM  Result Value Ref Range Status    Specimen Description URINE, CATHETERIZED  Final   Special Requests NONE  Final   Culture >=100,000 COLONIES/mL YEAST (A)  Final   Report Status 02/02/2017 FINAL  Final  Culture, blood (Routine x 2)     Status: Abnormal   Collection Time: 02/01/17  6:05 PM  Result Value Ref Range Status   Specimen Description BLOOD RIGHT ANTECUBITAL  Final   Special Requests IN PEDIATRIC BOTTLE Blood Culture adequate volume  Final   Culture  Setup Time   Final    GRAM POSITIVE COCCI IN CLUSTERS IN PEDIATRIC BOTTLE CRITICAL RESULT CALLED TO, READ BACK BY AND VERIFIED WITH: A JOHNSTON PHARMD 2239 02/02/17 A BROWNING    Culture (A)  Final    STAPHYLOCOCCUS SPECIES (COAGULASE NEGATIVE) THE SIGNIFICANCE OF ISOLATING THIS ORGANISM FROM A SINGLE SET OF BLOOD CULTURES WHEN MULTIPLE SETS ARE DRAWN IS UNCERTAIN. PLEASE NOTIFY THE MICROBIOLOGY DEPARTMENT WITHIN ONE WEEK IF SPECIATION AND SENSITIVITIES ARE REQUIRED.    Report Status 02/03/2017 FINAL  Final  Blood Culture ID Panel (Reflexed)     Status: None   Collection Time: 02/01/17  6:05 PM  Result Value Ref Range Status   Enterococcus species NOT DETECTED NOT DETECTED Final   Listeria monocytogenes NOT DETECTED NOT DETECTED Final   Staphylococcus species NOT DETECTED NOT DETECTED Final   Staphylococcus aureus NOT DETECTED NOT DETECTED Final   Streptococcus species NOT DETECTED NOT DETECTED Final   Streptococcus agalactiae NOT DETECTED NOT DETECTED Final   Streptococcus pneumoniae NOT DETECTED NOT DETECTED Final   Streptococcus pyogenes NOT DETECTED NOT DETECTED Final   Acinetobacter baumannii NOT DETECTED NOT DETECTED Final   Enterobacteriaceae  species NOT DETECTED NOT DETECTED Final   Enterobacter cloacae complex NOT DETECTED NOT DETECTED Final   Escherichia coli NOT DETECTED NOT DETECTED Final   Klebsiella oxytoca NOT DETECTED NOT DETECTED Final   Klebsiella pneumoniae NOT DETECTED NOT DETECTED Final   Proteus species NOT DETECTED NOT DETECTED Final    Serratia marcescens NOT DETECTED NOT DETECTED Final   Haemophilus influenzae NOT DETECTED NOT DETECTED Final   Neisseria meningitidis NOT DETECTED NOT DETECTED Final   Pseudomonas aeruginosa NOT DETECTED NOT DETECTED Final   Candida albicans NOT DETECTED NOT DETECTED Final   Candida glabrata NOT DETECTED NOT DETECTED Final   Candida krusei NOT DETECTED NOT DETECTED Final   Candida parapsilosis NOT DETECTED NOT DETECTED Final   Candida tropicalis NOT DETECTED NOT DETECTED Final  MRSA PCR Screening     Status: None   Collection Time: 02/01/17 11:15 PM  Result Value Ref Range Status   MRSA by PCR NEGATIVE NEGATIVE Final    Comment:        The GeneXpert MRSA Assay (FDA approved for NASAL specimens only), is one component of a comprehensive MRSA colonization surveillance program. It is not intended to diagnose MRSA infection nor to guide or monitor treatment for MRSA infections.        Radiology Studies: No results found.    Scheduled Meds: . [START ON 02/09/2017] enoxaparin (LOVENOX) injection  30 mg Subcutaneous Daily  . insulin aspart  0-9 Units Subcutaneous Q4H  . insulin glargine  10 Units Subcutaneous Daily  . mouth rinse  15 mL Mouth Rinse BID   Continuous Infusions: . fluconazole (DIFLUCAN) IV Stopped (02/08/17 1017)  . magnesium sulfate 1 - 4 g bolus IVPB    . potassium chloride 10 mEq (02/08/17 1338)     LOS: 7 days    Time spent: 40 minutes   Noralee Stain, DO [facility] [email or web address] Password [email or web address] 02/08/2017, 3:05 PM

## 2017-02-09 LAB — CBC WITH DIFFERENTIAL/PLATELET
Basophils Absolute: 0 10*3/uL (ref 0.0–0.1)
Basophils Relative: 0 %
EOS ABS: 0.1 10*3/uL (ref 0.0–0.7)
EOS PCT: 0 %
HCT: 24.3 % — ABNORMAL LOW (ref 39.0–52.0)
HEMOGLOBIN: 7.6 g/dL — AB (ref 13.0–17.0)
LYMPHS ABS: 1.8 10*3/uL (ref 0.7–4.0)
Lymphocytes Relative: 9 %
MCH: 26.9 pg (ref 26.0–34.0)
MCHC: 31.3 g/dL (ref 30.0–36.0)
MCV: 85.9 fL (ref 78.0–100.0)
MONOS PCT: 6 %
Monocytes Absolute: 1.1 10*3/uL — ABNORMAL HIGH (ref 0.1–1.0)
Neutro Abs: 16.3 10*3/uL — ABNORMAL HIGH (ref 1.7–7.7)
Neutrophils Relative %: 85 %
PLATELETS: 387 10*3/uL (ref 150–400)
RBC: 2.83 MIL/uL — ABNORMAL LOW (ref 4.22–5.81)
RDW: 16.2 % — ABNORMAL HIGH (ref 11.5–15.5)
WBC: 19.3 10*3/uL — ABNORMAL HIGH (ref 4.0–10.5)

## 2017-02-09 LAB — GLUCOSE, CAPILLARY
GLUCOSE-CAPILLARY: 110 mg/dL — AB (ref 65–99)
GLUCOSE-CAPILLARY: 144 mg/dL — AB (ref 65–99)
GLUCOSE-CAPILLARY: 239 mg/dL — AB (ref 65–99)
Glucose-Capillary: 49 mg/dL — ABNORMAL LOW (ref 65–99)
Glucose-Capillary: 77 mg/dL (ref 65–99)
Glucose-Capillary: 88 mg/dL (ref 65–99)
Glucose-Capillary: 132 mg/dL — ABNORMAL HIGH (ref 65–99)
Glucose-Capillary: 73 mg/dL (ref 65–99)

## 2017-02-09 LAB — BASIC METABOLIC PANEL
Anion gap: 8 (ref 5–15)
BUN: 8 mg/dL (ref 6–20)
CHLORIDE: 110 mmol/L (ref 101–111)
CO2: 23 mmol/L (ref 22–32)
CREATININE: 0.55 mg/dL — AB (ref 0.61–1.24)
Calcium: 7.9 mg/dL — ABNORMAL LOW (ref 8.9–10.3)
GFR calc Af Amer: >60 mL/min (ref >60)
GFR calc non Af Amer: >60 mL/min (ref >60)
Glucose, Bld: 132 mg/dL — ABNORMAL HIGH (ref 65–99)
Potassium: 3.1 mmol/L — ABNORMAL LOW (ref 3.5–5.1)
SODIUM: 141 mmol/L (ref 135–145)

## 2017-02-09 LAB — GASTROINTESTINAL PANEL BY PCR, STOOL (REPLACES STOOL CULTURE)
Adenovirus F40/41: NOT DETECTED
Astrovirus: NOT DETECTED
CYCLOSPORA CAYETANENSIS: NOT DETECTED
Campylobacter species: NOT DETECTED
Cryptosporidium: NOT DETECTED
ENTAMOEBA HISTOLYTICA: NOT DETECTED
ENTEROAGGREGATIVE E COLI (EAEC): NOT DETECTED
Enteropathogenic E coli (EPEC): DETECTED — AB
Enterotoxigenic E coli (ETEC): NOT DETECTED
GIARDIA LAMBLIA: NOT DETECTED
NOROVIRUS GI/GII: NOT DETECTED
Plesimonas shigelloides: NOT DETECTED
Rotavirus A: NOT DETECTED
SALMONELLA SPECIES: NOT DETECTED
SHIGELLA/ENTEROINVASIVE E COLI (EIEC): NOT DETECTED
Sapovirus (I, II, IV, and V): NOT DETECTED
Shiga like toxin producing E coli (STEC): NOT DETECTED
VIBRIO CHOLERAE: NOT DETECTED
VIBRIO SPECIES: NOT DETECTED
YERSINIA ENTEROCOLITICA: NOT DETECTED

## 2017-02-09 LAB — MAGNESIUM: MAGNESIUM: 1.7 mg/dL (ref 1.7–2.4)

## 2017-02-09 MED ORDER — POTASSIUM CHLORIDE 20 MEQ/15ML (10%) PO SOLN
40.0000 meq | Freq: Once | ORAL | Status: AC
  Administered 2017-02-09: 40 meq via ORAL
  Filled 2017-02-09: qty 30

## 2017-02-09 MED ORDER — FLUCONAZOLE 100 MG PO TABS
100.0000 mg | ORAL_TABLET | Freq: Every day | ORAL | Status: DC
  Administered 2017-02-10 – 2017-02-11 (×2): 100 mg via ORAL
  Filled 2017-02-09 (×2): qty 1

## 2017-02-09 MED ORDER — INSULIN GLARGINE 100 UNIT/ML ~~LOC~~ SOLN
5.0000 [IU] | Freq: Every day | SUBCUTANEOUS | Status: DC
  Administered 2017-02-10 – 2017-02-11 (×2): 5 [IU] via SUBCUTANEOUS
  Filled 2017-02-09 (×2): qty 0.05

## 2017-02-09 MED ORDER — CIPROFLOXACIN HCL 500 MG PO TABS
500.0000 mg | ORAL_TABLET | Freq: Two times a day (BID) | ORAL | Status: DC
  Administered 2017-02-09 – 2017-02-11 (×4): 500 mg via ORAL
  Filled 2017-02-09 (×4): qty 1

## 2017-02-09 NOTE — Progress Notes (Signed)
Hypoglycemic Event  CBG: 49  Treatment: 15 GM carbohydrate snack  Symptoms: None  Follow-up CBG: Time:0435  CBG Result: 77  Possible Reasons for Event: Inadequate meal intake  Comments/MD notified:Juice and ensure being offered regularly     Jennye BoroughsIngram, Sala Tague M

## 2017-02-09 NOTE — Progress Notes (Signed)
PROGRESS NOTE    Eric Cameron  ZHY:[id number] DOB: September 24, 1934 DOA: 02/01/2017 PCP: No primary care provider on file.     Brief Narrative:  Eric Cameron is a 82 yo male with past medical history of dementia, type 2 diabetes, CVA who presented with chief complaint of weakness and alter mentation per family. He was found to have greater than 100,000 colonies for yeast in his urine. He was admitted for sepsis secondary to UTI as well as presumed DKA (with hyperglycemia, although anion gap was only 8). He was empirically started on vancomycin and Rocephin, which was transitioned to Diflucan. Zosyn was started due to persistent fever, tachycardia and hypotension.   Assessment & Plan:   Principal Problem:   Severe sepsis (HCC) Active Problems:   HTN (hypertension)   Seizures (HCC)   Alzheimer's dementia without behavioral disturbance   UTI (urinary tract infection)   Acute renal failure (ARF) (HCC)   DKA (diabetic ketoacidosis) (HCC)   Protein-calorie malnutrition, severe   Palliative care encounter   Goals of care, counseling/discussion   Pressure injury of skin   Severe protein-calorie malnutrition (HCC)   Hypoglycemia   Sepsis secondary to UTI -Urine culture showed >100,000 yeast - ?colonizer -Blood culture with 1 of 2 coag neg staph - contaminant  -BP remains stable  -Diflucan x 14 days  -Zosyn discontinued. Unclear any bacterial source of infection. If patient becomes febrile, will need pan-culture   Diarrhea -Wonder if this is source of increasing WBC. Send GI PCR today. Diarrhea is not watery, low concern for C Diff   Hypokalemia -Replace, trend   Type 2 diabetes with hyperglycemia -Lantus and sliding scale insulin. Lantus dose decreased today  Acute kidney injury -Likely prerenal in etiology -Baseline creatinine 0.8-0.9 -Resolved back to normal   Hypovolemic hypernatremia -Resolved   Advanced Alzheimer dementia -Supportive care  Stage I pressure ulcer on  lower sacrum, present on admission -Supportive care  Seizures -No longer on antiepileptics -Monitor     DVT prophylaxis: lovenox  Code Status: DNR Family Communication: No family at bedside, spoke with sister over the phone 10/25  Disposition Plan: Hospice vs SNF with palliative care   Consultants:   Palliative care  Procedures:   None   Antimicrobials:  Anti-infectives    Start     Dose/Rate Route Frequency Ordered Stop   02/09/17 1330  fluconazole (DIFLUCAN) tablet 100 mg     100 mg Oral Daily 02/09/17 1319     02/04/17 0000  vancomycin (VANCOCIN) 500 mg in sodium chloride 0.9 % 100 mL IVPB  Status:  Discontinued     500 mg 100 mL/hr over 60 Minutes Intravenous Every 48 hours 02/01/17 2350 02/02/17 1028   02/03/17 0800  fluconazole (DIFLUCAN) IVPB 100 mg  Status:  Discontinued     100 mg 50 mL/hr over 60 Minutes Intravenous Every 24 hours 02/03/17 0702 02/09/17 1319   02/03/17 0500  vancomycin (VANCOCIN) 500 mg in sodium chloride 0.9 % 100 mL IVPB  Status:  Discontinued     500 mg 100 mL/hr over 60 Minutes Intravenous Every 24 hours 02/02/17 1028 02/03/17 1354   02/02/17 1830  cefTRIAXone (ROCEPHIN) 1 g in dextrose 5 % 50 mL IVPB  Status:  Discontinued     1 g 100 mL/hr over 30 Minutes Intravenous Every 24 hours 02/01/17 2233 02/01/17 2253   02/02/17 1600  piperacillin-tazobactam (ZOSYN) IVPB 3.375 g  Status:  Discontinued     3.375 g 12.5 mL/hr over 240 Minutes Intravenous  Every 8 hours 02/02/17 1028 02/08/17 1504   02/02/17 0000  vancomycin (VANCOCIN) IVPB 1000 mg/200 mL premix     1,000 mg 200 mL/hr over 60 Minutes Intravenous  Once 02/01/17 2350 02/02/17 0125   02/02/17 0000  piperacillin-tazobactam (ZOSYN) IVPB 2.25 g  Status:  Discontinued     2.25 g 100 mL/hr over 30 Minutes Intravenous Every 8 hours 02/01/17 2350 02/02/17 1028   02/01/17 1745  cefTRIAXone (ROCEPHIN) 2 g in dextrose 5 % 50 mL IVPB     2 g 100 mL/hr over 30 Minutes Intravenous  Once 02/01/17  1738 02/01/17 2001       Subjective: Patient with dementia, unable to participate in meaningful history. No complaints or acute events, sleeping comfortably and easily awakened.   Objective: Vitals:   02/08/17 0632 02/08/17 1420 02/08/17 2010 02/09/17 0433  BP: (!) 107/53 109/86 (!) 93/44 (!) 117/55  Pulse: 64 78 73 81  Resp: 17 16 15 16   Temp: 98.1 F (36.7 C) 98 F (36.7 C) 98.5 F (36.9 C) 98.1 F (36.7 C)  TempSrc: Oral Oral Oral Oral  SpO2: 100% 98% 100% 100%  Weight:      Height:        Intake/Output Summary (Last 24 hours) at 02/09/17 1319 Last data filed at 02/09/17 1200  Gross per 24 hour  Intake              478 ml  Output             3151 ml  Net            -2673 ml   Filed Weights   02/01/17 2300 02/01/17 2326 02/07/17 1711  Weight: 47.8 kg (105 lb 6.1 oz) 47.8 kg (105 lb 6.1 oz) 46.7 kg (103 lb)    Examination:  General exam: Appears calm and comfortable appearing  Respiratory system: Clear to auscultation. Respiratory effort normal. Cardiovascular system: S1 & S2 heard, RRR. No JVD, murmurs, rubs, gallops or clicks. No pedal edema. Gastrointestinal system: Abdomen is nondistended, soft and nontender. No organomegaly or masses felt. Normal bowel sounds heard. Central nervous system: Alert, poor historian  Extremities: Symmetric Psychiatry: +dementia   Data Reviewed: I have personally reviewed following labs and imaging studies  CBC:  Recent Labs Lab 02/04/17 0551 02/06/17 0731 02/07/17 0817 02/08/17 0824 02/09/17 0513  WBC 12.8* 14.2* 14.3* 16.0* 19.3*  NEUTROABS  --   --   --   --  16.3*  HGB 7.8* 7.4* 7.1* 7.8* 7.6*  HCT 25.3* 24.7* 22.4* 25.6* 24.3*  MCV 86.1 85.8 84.8 85.6 85.9  PLT 457* 415* 373 421* 387   Basic Metabolic Panel:  Recent Labs Lab 02/06/17 0731 02/07/17 0817 02/07/17 1813 02/08/17 0727 02/08/17 0824 02/08/17 0950 02/09/17 0513  NA 142 142 142  --  145  --  141  K 3.2* 2.7* 3.0*  --  2.7*  --  3.1*  CL 114*  117* 116*  --  115*  --  110  CO2 17* 17* 20*  --  22  --  23  GLUCOSE 147* 155* 271*  --  84  --  132*  BUN 38* 32* 26*  --  19  --  8  CREATININE 1.04 0.99 0.86 0.73 0.70  --  0.55*  CALCIUM 8.6* 8.5* 8.4*  --  8.3*  --  7.9*  MG  --   --   --   --   --  1.5* 1.7  GFR: Estimated Creatinine Clearance: 48.6 mL/min (A) (by C-G formula based on SCr of 0.55 mg/dL (L)). Liver Function Tests:  Recent Labs Lab 02/03/17 0729  AST 25  ALT 19  ALKPHOS 63  BILITOT 0.5  PROT 7.7  ALBUMIN 1.8*   No results for input(s): LIPASE, AMYLASE in the last 168 hours. No results for input(s): AMMONIA in the last 168 hours. Coagulation Profile: No results for input(s): INR, PROTIME in the last 168 hours. Cardiac Enzymes: No results for input(s): CKTOTAL, CKMB, CKMBINDEX, TROPONINI in the last 168 hours. BNP (last 3 results) No results for input(s): PROBNP in the last 8760 hours. HbA1C: No results for input(s): HGBA1C in the last 72 hours. CBG:  Recent Labs Lab 02/09/17 0019 02/09/17 0415 02/09/17 0438 02/09/17 0828 02/09/17 1250  GLUCAP 88 49* 77 239* 132*   Lipid Profile: No results for input(s): CHOL, HDL, LDLCALC, TRIG, CHOLHDL, LDLDIRECT in the last 72 hours. Thyroid Function Tests: No results for input(s): TSH, T4TOTAL, FREET4, T3FREE, THYROIDAB in the last 72 hours. Anemia Panel: No results for input(s): VITAMINB12, FOLATE, FERRITIN, TIBC, IRON, RETICCTPCT in the last 72 hours. Sepsis Labs: No results for input(s): PROCALCITON, LATICACIDVEN in the last 168 hours.  Recent Results (from the past 240 hour(s))  Culture, blood (Routine x 2)     Status: None   Collection Time: 02/01/17  4:40 PM  Result Value Ref Range Status   Specimen Description BLOOD RIGHT HAND  Final   Special Requests   Final    BOTTLES DRAWN AEROBIC ONLY Blood Culture adequate volume   Culture NO GROWTH 5 DAYS  Final   Report Status 02/06/2017 FINAL  Final  Urine Culture     Status: Abnormal   Collection  Time: 02/01/17  5:28 PM  Result Value Ref Range Status   Specimen Description URINE, CATHETERIZED  Final   Special Requests NONE  Final   Culture >=100,000 COLONIES/mL YEAST (A)  Final   Report Status 02/02/2017 FINAL  Final  Culture, blood (Routine x 2)     Status: Abnormal   Collection Time: 02/01/17  6:05 PM  Result Value Ref Range Status   Specimen Description BLOOD RIGHT ANTECUBITAL  Final   Special Requests IN PEDIATRIC BOTTLE Blood Culture adequate volume  Final   Culture  Setup Time   Final    GRAM POSITIVE COCCI IN CLUSTERS IN PEDIATRIC BOTTLE CRITICAL RESULT CALLED TO, READ BACK BY AND VERIFIED WITH: A JOHNSTON PHARMD 2239 02/02/17 A BROWNING    Culture (A)  Final    STAPHYLOCOCCUS SPECIES (COAGULASE NEGATIVE) THE SIGNIFICANCE OF ISOLATING THIS ORGANISM FROM A SINGLE SET OF BLOOD CULTURES WHEN MULTIPLE SETS ARE DRAWN IS UNCERTAIN. PLEASE NOTIFY THE MICROBIOLOGY DEPARTMENT WITHIN ONE WEEK IF SPECIATION AND SENSITIVITIES ARE REQUIRED.    Report Status 02/03/2017 FINAL  Final  Blood Culture ID Panel (Reflexed)     Status: None   Collection Time: 02/01/17  6:05 PM  Result Value Ref Range Status   Enterococcus species NOT DETECTED NOT DETECTED Final   Listeria monocytogenes NOT DETECTED NOT DETECTED Final   Staphylococcus species NOT DETECTED NOT DETECTED Final   Staphylococcus aureus NOT DETECTED NOT DETECTED Final   Streptococcus species NOT DETECTED NOT DETECTED Final   Streptococcus agalactiae NOT DETECTED NOT DETECTED Final   Streptococcus pneumoniae NOT DETECTED NOT DETECTED Final   Streptococcus pyogenes NOT DETECTED NOT DETECTED Final   Acinetobacter baumannii NOT DETECTED NOT DETECTED Final   Enterobacteriaceae species NOT DETECTED NOT DETECTED Final  Enterobacter cloacae complex NOT DETECTED NOT DETECTED Final   Escherichia coli NOT DETECTED NOT DETECTED Final   Klebsiella oxytoca NOT DETECTED NOT DETECTED Final   Klebsiella pneumoniae NOT DETECTED NOT DETECTED  Final   Proteus species NOT DETECTED NOT DETECTED Final   Serratia marcescens NOT DETECTED NOT DETECTED Final   Haemophilus influenzae NOT DETECTED NOT DETECTED Final   Neisseria meningitidis NOT DETECTED NOT DETECTED Final   Pseudomonas aeruginosa NOT DETECTED NOT DETECTED Final   Candida albicans NOT DETECTED NOT DETECTED Final   Candida glabrata NOT DETECTED NOT DETECTED Final   Candida krusei NOT DETECTED NOT DETECTED Final   Candida parapsilosis NOT DETECTED NOT DETECTED Final   Candida tropicalis NOT DETECTED NOT DETECTED Final  MRSA PCR Screening     Status: None   Collection Time: 02/01/17 11:15 PM  Result Value Ref Range Status   MRSA by PCR NEGATIVE NEGATIVE Final    Comment:        The GeneXpert MRSA Assay (FDA approved for NASAL specimens only), is one component of a comprehensive MRSA colonization surveillance program. It is not intended to diagnose MRSA infection nor to guide or monitor treatment for MRSA infections.        Radiology Studies: No results found.    Scheduled Meds: . enoxaparin (LOVENOX) injection  30 mg Subcutaneous Daily  . fluconazole  100 mg Oral Daily  . insulin aspart  0-9 Units Subcutaneous Q4H  . [START ON 02/10/2017] insulin glargine  5 Units Subcutaneous Daily  . mouth rinse  15 mL Mouth Rinse BID   Continuous Infusions:    LOS: 8 days    Time spent: 30 minutes   Noralee StainJennifer Browning Southwood, DO [facility] [email or web address] Password [email or web address] 02/09/2017, 1:19 PM

## 2017-02-09 NOTE — Progress Notes (Signed)
  Speech Language Pathology Treatment: Dysphagia  Patient Details Name: Eric CopaRobert Quintero MRN: [id number] DOB: 1934-05-29 Today's Date: 02/09/2017 Time: 0950-1020 SLP Time Calculation (min) (ACUTE ONLY): 30 min  Assessment / Plan / Recommendation Clinical Impression  Pt seen during breakfast meal to assess diet tolerance and complete education. SLP evaluation recommended Dys 1(puree) diet and thin liquids. Since BSE, pt's diet has been advanced to Dys 3 (mechanical soft). Pt was fed small boluses of scrambled eggs and ground sausage, and exhibited significantly extended mastication and oral prep.   While pt may be able to tolerate Dys 3 soft solids from a dysphagia standpoint, pt aspiration risk is likely to increase significantly given energy required to chew and swallow this level of solid consistency. Pt chews one small bolus in excess of five minutes. Liquid bolus does not make oral prep and propulsion faster or more efficient, but does help to clear oral cavity once pt swallows. In addition, increased fatigue may adversely affect amount of po intake at a given meal or over the course of a day, increasing risk for malnutrition and dehydration.    Given this information, as well as pt presentation at bedside, will downgrade diet to dys 2 with thin liquids, primarily for energy conservation and to facilitate adequate nutrition and hydration. ST will continue to follow to assess diet tolerance and provide education.  [facility] and updated to reflect diet change and strategies. RN and NT informed of results and recommendations.    HPI HPI: Ptis an 82 y.o.male presenting with AMS and found to have sepsis from UTI. PMH includes dementia, HTN, DM, seizures, PNA, dehydration, and depression. Pt has had swallow studies in the past, including MBS in 2015 that recommended Dys 3 diet and thin liquids. Since then he has had three clinical evaluations, and although he has been  able to stay on thin liquids, his most recent diet recommendation was for Dys 1 textures due in large part to his cognitive status.      SLP Plan  Continue with current plan of care       Recommendations  Diet recommendations: Dysphagia 2 (fine chop);Thin liquid Liquids provided via: Straw Medication Administration: Crushed with puree Supervision: Staff to assist with self feeding;Full supervision/cueing for compensatory strategies Compensations: Slow rate;Small sips/bites;Lingual sweep for clearance of pocketing;Minimize environmental distractions;Follow solids with liquid Postural Changes and/or Swallow Maneuvers: Seated upright 90 degrees                Oral Care Recommendations: Oral care BID Follow up Recommendations: Skilled Nursing facility SLP Visit Diagnosis: Dysphagia, unspecified (R13.10) Plan: Continue with current plan of care       GO               Abdulah Iqbal B. Murvin NatalBueche, [facility] CenterMSP, CCC-SLP Speech Language Pathologist [phone]  Leigh AuroraBueche, [facility] 02/09/2017, 10:22 AM

## 2017-02-09 NOTE — Progress Notes (Signed)
Physical Therapy Treatment Patient Details Name: Eric Cameron MRN: [id number] DOB: 09/17/1934 Today's Date: 02/09/2017    History of Present Illness Pt adm with sepsis due to UTI. PMH - dementia, DM, CVA    PT Comments    Pt with slow progression towards goals. Pt restless this session and after sitting on EOB, returned to supine and curled up on his side. Did roll from side to side to allow PT to clean up after pt had BM. Required min A to supervision for bed mobility.  Performed one supine exercise, however, pt refusing further. Current recommendations appropriate. Will continue to follow acutely to maximize functional mobility independence and safety.   Follow Up Recommendations  SNF     Equipment Recommendations  None recommended by PT    Recommendations for Other Services       Precautions / Restrictions Precautions Precautions: Fall Restrictions Weight Bearing Restrictions: No    Mobility  Bed Mobility Overal bed mobility: Needs Assistance Bed Mobility: Supine to Sit;Sit to Supine;Rolling Rolling: Supervision   Supine to sit: Min assist Sit to supine: Supervision   General bed mobility comments: Min A for trunk elevation. Upon sitting, pt restless and wanting to lay back down. Upon return to supine, noted pt had a BM, so performed rolling to clean up. Required increased time to follow cues to roll.   Transfers                 General transfer comment: Pt laying back down upon sitting up and balling up, not wanting to stand.   Ambulation/Gait                 Stairs            Wheelchair Mobility    Modified Rankin (Stroke Patients Only)       Balance Overall balance assessment: Needs assistance Sitting-balance support: No upper extremity supported;Feet supported Sitting balance-Leahy Scale: Fair                                      Cognition Arousal/Alertness: Awake/alert Behavior During Therapy:  Restless Overall Cognitive Status: No family/caregiver present to determine baseline cognitive functioning                                 General Comments: dementia at baseline. Pt following 1 step commands and communicating some. Pt seemed restless this session.       Exercises General Exercises - Lower Extremity Ankle Circles/Pumps: AROM;Both;10 reps    General Comments General comments (skin integrity, edema, etc.): Pt very restless this session and did not want to do bed level exercises. Was agreeable to clean up after BM.       Pertinent Vitals/Pain Pain Assessment: Faces Faces Pain Scale: Hurts a little bit Pain Location: grimacing with repositioning and clean up Pain Descriptors / Indicators: Grimacing Pain Intervention(s): Limited activity within patient's tolerance;Monitored during session;Repositioned    Home Living                      Prior Function            PT Goals (current goals can now be found in the care plan section) Acute Rehab PT Goals Patient Stated Goal: Pt didn't state PT Goal Formulation: With patient Time For Goal Achievement: 02/20/17 Potential to  Achieve Goals: Good Progress towards PT goals: Progressing toward goals (slow )    Frequency    Min 3X/week      PT Plan Current plan remains appropriate    Co-evaluation              AM-PAC PT "6 Clicks" Daily Activity  Outcome Measure  Difficulty turning over in bed (including adjusting bedclothes, sheets and blankets)?: A Little Difficulty moving from lying on back to sitting on the side of the bed? : Unable Difficulty sitting down on and standing up from a chair with arms (e.g., wheelchair, bedside commode, etc,.)?: Unable Help needed moving to and from a bed to chair (including a wheelchair)?: A Lot Help needed walking in hospital room?: A Lot Help needed climbing 3-5 steps with a railing? : Total 6 Click Score: 10    End of Session Equipment Utilized  During Treatment: Gait belt Activity Tolerance: Other (comment) (pt very restless) Patient left: in bed;with call bell/phone within reach;with bed alarm set Nurse Communication: Mobility status PT Visit Diagnosis: Unsteadiness on feet (R26.81);Other abnormalities of gait and mobility (R26.89);Muscle weakness (generalized) (M62.81)     Time: 1308-6578 PT Time Calculation (min) (ACUTE ONLY): 19 min  Charges:  $Therapeutic Activity: 8-22 mins                    G Codes:       Gladys Damme, PT, DPT  Acute Rehabilitation Services  Pager: [phone]    Lehman Prom 02/09/2017, 5:48 PM

## 2017-02-09 NOTE — Progress Notes (Signed)
Inpatient Diabetes Program Recommendations  AACE/ADA: New Consensus Statement on Inpatient Glycemic Control (2015)  Target Ranges:  Prepandial:   less than 140 mg/dL      Peak postprandial:   less than 180 mg/dL (1-2 hours)      Critically ill patients:  140 - 180 mg/dL   Lab Results  Component Value Date   GLUCAP 239 (H) 02/09/2017   HGBA1C 7.9 (H) 02/02/2017    Review of Glycemic Control  Hypo this am. Steroids have been discontinued. Needs insulin adjustment.  Inpatient Diabetes Program Recommendations:    Decrease Lantus to 5 units QD  Thank you. Eric Cameron, RD, LDN, CDE Inpatient Diabetes Coordinator [phone]

## 2017-02-09 NOTE — Progress Notes (Addendum)
No charge note.  Stopped by to see patient. Patient resting comfortably, no complaints. Smiling and conversing. Intake of about 75% of breakfast. Noted adjustments in insulin regimen. Attempted to call sister to follow up goals of care conversation. Awaiting call back. Will continue to follow and reach out to sister.   Gerlean RenShae Lee Del Wiseman, DNP, AGNP-C Palliative Medicine Team Team Phone # [phone]

## 2017-02-10 LAB — BASIC METABOLIC PANEL
ANION GAP: 6 (ref 5–15)
BUN: 8 mg/dL (ref 6–20)
CALCIUM: 8.1 mg/dL — AB (ref 8.9–10.3)
CO2: 28 mmol/L (ref 22–32)
CREATININE: 0.51 mg/dL — AB (ref 0.61–1.24)
Chloride: 103 mmol/L (ref 101–111)
GLUCOSE: 71 mg/dL (ref 65–99)
Potassium: 3.3 mmol/L — ABNORMAL LOW (ref 3.5–5.1)
Sodium: 137 mmol/L (ref 135–145)

## 2017-02-10 LAB — GLUCOSE, CAPILLARY
GLUCOSE-CAPILLARY: 204 mg/dL — AB (ref 65–99)
Glucose-Capillary: 70 mg/dL (ref 65–99)
Glucose-Capillary: 68 mg/dL (ref 65–99)
Glucose-Capillary: 87 mg/dL (ref 65–99)
Glucose-Capillary: 154 mg/dL — ABNORMAL HIGH (ref 65–99)

## 2017-02-10 LAB — CBC WITH DIFFERENTIAL/PLATELET
BASOS ABS: 0 10*3/uL (ref 0.0–0.1)
BASOS PCT: 0 %
EOS PCT: 0 %
Eosinophils Absolute: 0.1 10*3/uL (ref 0.0–0.7)
HCT: 24.4 % — ABNORMAL LOW (ref 39.0–52.0)
Hemoglobin: 7.6 g/dL — ABNORMAL LOW (ref 13.0–17.0)
Lymphocytes Relative: 12 %
Lymphs Abs: 2.3 10*3/uL (ref 0.7–4.0)
MCH: 26.8 pg (ref 26.0–34.0)
MCHC: 31.1 g/dL (ref 30.0–36.0)
MCV: 85.9 fL (ref 78.0–100.0)
MONO ABS: 1.1 10*3/uL — AB (ref 0.1–1.0)
Monocytes Relative: 6 %
Neutro Abs: 15.4 10*3/uL — ABNORMAL HIGH (ref 1.7–7.7)
Neutrophils Relative %: 82 %
PLATELETS: 418 10*3/uL — AB (ref 150–400)
RBC: 2.84 MIL/uL — ABNORMAL LOW (ref 4.22–5.81)
RDW: 17.4 % — AB (ref 11.5–15.5)
WBC: 18.9 10*3/uL — ABNORMAL HIGH (ref 4.0–10.5)

## 2017-02-10 LAB — MAGNESIUM: Magnesium: 1.6 mg/dL — ABNORMAL LOW (ref 1.7–2.4)

## 2017-02-10 MED ORDER — MAGNESIUM SULFATE 2 GM/50ML IV SOLN
2.0000 g | Freq: Once | INTRAVENOUS | Status: AC
  Administered 2017-02-10: 2 g via INTRAVENOUS
  Filled 2017-02-10: qty 50

## 2017-02-10 MED ORDER — POTASSIUM CHLORIDE 20 MEQ/15ML (10%) PO SOLN
40.0000 meq | Freq: Once | ORAL | Status: AC
  Administered 2017-02-10: 40 meq via ORAL
  Filled 2017-02-10: qty 30

## 2017-02-10 MED ORDER — GLUCERNA SHAKE PO LIQD
237.0000 mL | Freq: Three times a day (TID) | ORAL | Status: DC
  Administered 2017-02-10 – 2017-02-11 (×4): 237 mL via ORAL

## 2017-02-10 NOTE — Progress Notes (Signed)
Unable to take malnutrition screening due to pt. Orientation. Pt. Is only alert to self and not to situation.

## 2017-02-10 NOTE — Progress Notes (Signed)
PROGRESS NOTE    [name]  RUE:[id number] DOB: 1934/12/14 DOA: 02/01/2017 PCP: No primary care provider on file.     Brief Narrative:  Eric Cameron is a 82 yo male with past medical history of dementia, type 2 diabetes, CVA who presented with chief complaint of weakness and alter mentation per family. He was found to have greater than 100,000 colonies for yeast in his urine. He was admitted for sepsis secondary to UTI as well as presumed DKA (with hyperglycemia, although anion gap was only 8). He was empirically started on vancomycin and Rocephin, which was transitioned to Diflucan. Zosyn was started due to persistent fever, tachycardia and hypotension. With improvement in hemodynamics, zosyn was stopped. However, he continued to have elevation in WBC and then had episodes of diarrhea. Stool PCR is positive for enteropathogenic E Coli. As patient has been septic, he was empirically started on cipro.   Assessment & Plan:   Principal Problem:   Severe sepsis (HCC) Active Problems:   HTN (hypertension)   Seizures (HCC)   Alzheimer's dementia without behavioral disturbance   UTI (urinary tract infection)   Acute renal failure (ARF) (HCC)   DKA (diabetic ketoacidosis) (HCC)   Protein-calorie malnutrition, severe   Palliative care encounter   Goals of care, counseling/discussion   Pressure injury of skin   Severe protein-calorie malnutrition (HCC)   Hypoglycemia   Sepsis secondary to UTI, Enteropathogenic E Coli -Urine culture showed >100,000 yeast - ?colonizer. Diflucan x 14 days  -Blood culture with 1 of 2 coag neg staph - contaminant  -Due to increasing WBC, as well as fever and hypotension few days ago, we will elect to empirically treat with antibiotics. Started cipro. Diarrhea is not watery, low concern for C Diff   Hypokalemia -Replace, trend   Hypomagnesemia -Replace, trend   Type 2 diabetes with hyperglycemia -Lantus and sliding scale insulin  Acute kidney  injury -Likely prerenal in etiology -Baseline creatinine 0.8-0.9 -Resolved back to normal   Hypovolemic hypernatremia -Resolved   Advanced Alzheimer dementia -Supportive care  Stage I pressure ulcer on lower sacrum, present on admission -Supportive care  Seizures -No longer on antiepileptics -Monitor     DVT prophylaxis: lovenox  Code Status: DNR Family Communication: No family at bedside, spoke with sister over the phone 10/25  Disposition Plan: Hospice vs SNF with palliative care tomorrow 10/27 if WBC improving    Consultants:   Palliative care  Procedures:   None   Antimicrobials:  Anti-infectives    Start     Dose/Rate Route Frequency Ordered Stop   02/10/17 1000  fluconazole (DIFLUCAN) tablet 100 mg     100 mg Oral Daily 02/09/17 1319     02/09/17 2000  ciprofloxacin (CIPRO) tablet 500 mg     500 mg Oral 2 times daily 02/09/17 1810     02/04/17 0000  vancomycin (VANCOCIN) 500 mg in sodium chloride 0.9 % 100 mL IVPB  Status:  Discontinued     500 mg 100 mL/hr over 60 Minutes Intravenous Every 48 hours 02/01/17 2350 02/02/17 1028   02/03/17 0800  fluconazole (DIFLUCAN) IVPB 100 mg  Status:  Discontinued     100 mg 50 mL/hr over 60 Minutes Intravenous Every 24 hours 02/03/17 0702 02/09/17 1319   02/03/17 0500  vancomycin (VANCOCIN) 500 mg in sodium chloride 0.9 % 100 mL IVPB  Status:  Discontinued     500 mg 100 mL/hr over 60 Minutes Intravenous Every 24 hours 02/02/17 1028 02/03/17 1354  02/02/17 1830  cefTRIAXone (ROCEPHIN) 1 g in dextrose 5 % 50 mL IVPB  Status:  Discontinued     1 g 100 mL/hr over 30 Minutes Intravenous Every 24 hours 02/01/17 2233 02/01/17 2253   02/02/17 1600  piperacillin-tazobactam (ZOSYN) IVPB 3.375 g  Status:  Discontinued     3.375 g 12.5 mL/hr over 240 Minutes Intravenous Every 8 hours 02/02/17 1028 02/08/17 1504   02/02/17 0000  vancomycin (VANCOCIN) IVPB 1000 mg/200 mL premix     1,000 mg 200 mL/hr over 60 Minutes Intravenous   Once 02/01/17 2350 02/02/17 0125   02/02/17 0000  piperacillin-tazobactam (ZOSYN) IVPB 2.25 g  Status:  Discontinued     2.25 g 100 mL/hr over 30 Minutes Intravenous Every 8 hours 02/01/17 2350 02/02/17 1028   02/01/17 1745  cefTRIAXone (ROCEPHIN) 2 g in dextrose 5 % 50 mL IVPB     2 g 100 mL/hr over 30 Minutes Intravenous  Once 02/01/17 1738 02/01/17 2001       Subjective: Patient with dementia, unable to participate in meaningful history. No complaints or acute events, resting comfortably in bed   Objective: Vitals:   02/09/17 1459 02/09/17 2139 02/10/17 0421 02/10/17 0456  BP: 110/78 110/66 115/70 115/70  Pulse: 85 81 84 94  Resp: 16 17 18 18   Temp: 98 F (36.7 C) 98.1 F (36.7 C) (!) 97.4 F (36.3 C) (!) 97.4 F (36.3 C)  TempSrc: Oral Oral Oral Oral  SpO2: 100% 100% 99% 99%  Weight:      Height:        Intake/Output Summary (Last 24 hours) at 02/10/17 1338 Last data filed at 02/10/17 0941  Gross per 24 hour  Intake              364 ml  Output              875 ml  Net             -511 ml   Filed Weights   02/01/17 2300 02/01/17 2326 02/07/17 1711  Weight: 47.8 kg (105 lb 6.1 oz) 47.8 kg (105 lb 6.1 oz) 46.7 kg (103 lb)    Examination:  General exam: Appears calm and comfortable appearing  Respiratory system: Clear to auscultation. Respiratory effort normal. Cardiovascular system: S1 & S2 heard, RRR. No JVD, murmurs, rubs, gallops or clicks. No pedal edema. Gastrointestinal system: Abdomen is nondistended, soft and nontender. No organomegaly or masses felt. Normal bowel sounds heard. Central nervous system: Alert, poor historian  Extremities: Symmetric Psychiatry: +dementia   Data Reviewed: I have personally reviewed following labs and imaging studies  CBC:  Recent Labs Lab 02/06/17 0731 02/07/17 0817 02/08/17 0824 02/09/17 0513 02/10/17 0650  WBC 14.2* 14.3* 16.0* 19.3* 18.9*  NEUTROABS  --   --   --  16.3* 15.4*  HGB 7.4* 7.1* 7.8* 7.6* 7.6*    HCT 24.7* 22.4* 25.6* 24.3* 24.4*  MCV 85.8 84.8 85.6 85.9 85.9  PLT 415* 373 421* 387 418*   Basic Metabolic Panel:  Recent Labs Lab 02/07/17 0817 02/07/17 1813 02/08/17 0727 02/08/17 0824 02/08/17 0950 02/09/17 0513 02/10/17 0650  NA 142 142  --  145  --  141 137  K 2.7* 3.0*  --  2.7*  --  3.1* 3.3*  CL 117* 116*  --  115*  --  110 103  CO2 17* 20*  --  22  --  23 28  GLUCOSE 155* 271*  --  84  --  132* 71  BUN 32* 26*  --  19  --  8 8  CREATININE 0.99 0.86 0.73 0.70  --  0.55* 0.51*  CALCIUM 8.5* 8.4*  --  8.3*  --  7.9* 8.1*  MG  --   --   --   --  1.5* 1.7 1.6*   GFR: Estimated Creatinine Clearance: 48.6 mL/min (A) (by C-G formula based on SCr of 0.51 mg/dL (L)). Liver Function Tests: No results for input(s): AST, ALT, ALKPHOS, BILITOT, PROT, ALBUMIN in the last 168 hours. No results for input(s): LIPASE, AMYLASE in the last 168 hours. No results for input(s): AMMONIA in the last 168 hours. Coagulation Profile: No results for input(s): INR, PROTIME in the last 168 hours. Cardiac Enzymes: No results for input(s): CKTOTAL, CKMB, CKMBINDEX, TROPONINI in the last 168 hours. BNP (last 3 results) No results for input(s): PROBNP in the last 8760 hours. HbA1C: No results for input(s): HGBA1C in the last 72 hours. CBG:  Recent Labs Lab 02/10/17 0004 02/10/17 0405 02/10/17 0812 02/10/17 0844 02/10/17 1206  GLUCAP 110* 70 68 87 204*   Lipid Profile: No results for input(s): CHOL, HDL, LDLCALC, TRIG, CHOLHDL, LDLDIRECT in the last 72 hours. Thyroid Function Tests: No results for input(s): TSH, T4TOTAL, FREET4, T3FREE, THYROIDAB in the last 72 hours. Anemia Panel: No results for input(s): VITAMINB12, FOLATE, FERRITIN, TIBC, IRON, RETICCTPCT in the last 72 hours. Sepsis Labs: No results for input(s): PROCALCITON, LATICACIDVEN in the last 168 hours.  Recent Results (from the past 240 hour(s))  Culture, blood (Routine x 2)     Status: None   Collection Time:  02/01/17  4:40 PM  Result Value Ref Range Status   Specimen Description BLOOD RIGHT HAND  Final   Special Requests   Final    BOTTLES DRAWN AEROBIC ONLY Blood Culture adequate volume   Culture NO GROWTH 5 DAYS  Final   Report Status 02/06/2017 FINAL  Final  Urine Culture     Status: Abnormal   Collection Time: 02/01/17  5:28 PM  Result Value Ref Range Status   Specimen Description URINE, CATHETERIZED  Final   Special Requests NONE  Final   Culture >=100,000 COLONIES/mL YEAST (A)  Final   Report Status 02/02/2017 FINAL  Final  Culture, blood (Routine x 2)     Status: Abnormal   Collection Time: 02/01/17  6:05 PM  Result Value Ref Range Status   Specimen Description BLOOD RIGHT ANTECUBITAL  Final   Special Requests IN PEDIATRIC BOTTLE Blood Culture adequate volume  Final   Culture  Setup Time   Final    GRAM POSITIVE COCCI IN CLUSTERS IN PEDIATRIC BOTTLE CRITICAL RESULT CALLED TO, READ BACK BY AND VERIFIED WITH: A JOHNSTON PHARMD 2239 02/02/17 A BROWNING    Culture (A)  Final    STAPHYLOCOCCUS SPECIES (COAGULASE NEGATIVE) THE SIGNIFICANCE OF ISOLATING THIS ORGANISM FROM A SINGLE SET OF BLOOD CULTURES WHEN MULTIPLE SETS ARE DRAWN IS UNCERTAIN. PLEASE NOTIFY THE MICROBIOLOGY DEPARTMENT WITHIN ONE WEEK IF SPECIATION AND SENSITIVITIES ARE REQUIRED.    Report Status 02/03/2017 FINAL  Final  Blood Culture ID Panel (Reflexed)     Status: None   Collection Time: 02/01/17  6:05 PM  Result Value Ref Range Status   Enterococcus species NOT DETECTED NOT DETECTED Final   Listeria monocytogenes NOT DETECTED NOT DETECTED Final   Staphylococcus species NOT DETECTED NOT DETECTED Final   Staphylococcus aureus NOT DETECTED NOT DETECTED Final   Streptococcus species NOT DETECTED NOT  DETECTED Final   Streptococcus agalactiae NOT DETECTED NOT DETECTED Final   Streptococcus pneumoniae NOT DETECTED NOT DETECTED Final   Streptococcus pyogenes NOT DETECTED NOT DETECTED Final   Acinetobacter baumannii NOT  DETECTED NOT DETECTED Final   Enterobacteriaceae species NOT DETECTED NOT DETECTED Final   Enterobacter cloacae complex NOT DETECTED NOT DETECTED Final   Escherichia coli NOT DETECTED NOT DETECTED Final   Klebsiella oxytoca NOT DETECTED NOT DETECTED Final   Klebsiella pneumoniae NOT DETECTED NOT DETECTED Final   Proteus species NOT DETECTED NOT DETECTED Final   Serratia marcescens NOT DETECTED NOT DETECTED Final   Haemophilus influenzae NOT DETECTED NOT DETECTED Final   Neisseria meningitidis NOT DETECTED NOT DETECTED Final   Pseudomonas aeruginosa NOT DETECTED NOT DETECTED Final   Candida albicans NOT DETECTED NOT DETECTED Final   Candida glabrata NOT DETECTED NOT DETECTED Final   Candida krusei NOT DETECTED NOT DETECTED Final   Candida parapsilosis NOT DETECTED NOT DETECTED Final   Candida tropicalis NOT DETECTED NOT DETECTED Final  MRSA PCR Screening     Status: None   Collection Time: 02/01/17 11:15 PM  Result Value Ref Range Status   MRSA by PCR NEGATIVE NEGATIVE Final    Comment:        The GeneXpert MRSA Assay (FDA approved for NASAL specimens only), is one component of a comprehensive MRSA colonization surveillance program. It is not intended to diagnose MRSA infection nor to guide or monitor treatment for MRSA infections.   Gastrointestinal Panel by PCR , Stool     Status: Abnormal   Collection Time: 02/09/17 10:13 AM  Result Value Ref Range Status   Campylobacter species NOT DETECTED NOT DETECTED Final   Plesimonas shigelloides NOT DETECTED NOT DETECTED Final   Salmonella species NOT DETECTED NOT DETECTED Final   Yersinia enterocolitica NOT DETECTED NOT DETECTED Final   Vibrio species NOT DETECTED NOT DETECTED Final   Vibrio cholerae NOT DETECTED NOT DETECTED Final   Enteroaggregative E coli (EAEC) NOT DETECTED NOT DETECTED Final   Enteropathogenic E coli (EPEC) DETECTED (A) NOT DETECTED Final    Comment: RESULT CALLED TO, READ BACK BY AND VERIFIED WITH: IRETI  OMOSEEBI AT 1728 ON 02/09/2017 JJB    Enterotoxigenic E coli (ETEC) NOT DETECTED NOT DETECTED Final   Shiga like toxin producing E coli (STEC) NOT DETECTED NOT DETECTED Final   Shigella/Enteroinvasive E coli (EIEC) NOT DETECTED NOT DETECTED Final   Cryptosporidium NOT DETECTED NOT DETECTED Final   Cyclospora cayetanensis NOT DETECTED NOT DETECTED Final   Entamoeba histolytica NOT DETECTED NOT DETECTED Final   Giardia lamblia NOT DETECTED NOT DETECTED Final   Adenovirus F40/41 NOT DETECTED NOT DETECTED Final   Astrovirus NOT DETECTED NOT DETECTED Final   Norovirus GI/GII NOT DETECTED NOT DETECTED Final   Rotavirus A NOT DETECTED NOT DETECTED Final   Sapovirus (I, II, IV, and V) NOT DETECTED NOT DETECTED Final       Radiology Studies: No results found.    Scheduled Meds: . ciprofloxacin  500 mg Oral BID  . enoxaparin (LOVENOX) injection  30 mg Subcutaneous Daily  . feeding supplement (GLUCERNA SHAKE)  237 mL Oral TID BM  . fluconazole  100 mg Oral Daily  . insulin aspart  0-9 Units Subcutaneous Q4H  . insulin glargine  5 Units Subcutaneous Daily  . mouth rinse  15 mL Mouth Rinse BID   Continuous Infusions: . magnesium sulfate 1 - 4 g bolus IVPB 2 g (02/10/17 1231)  LOS: 9 days    Time spent: 30 minutes   Noralee Stain, DO [facility] [email or web address] Password [email or web address] 02/10/2017, 1:38 PM

## 2017-02-10 NOTE — Progress Notes (Signed)
  Speech Language Pathology Treatment: Dysphagia  Patient Details Name: Eric Cameron MRN: [id number] DOB: [birth year] Today's Date: 02/10/2017 Time: [id number] SLP Time Calculation (min) (ACUTE ONLY): 19 min  Assessment / Plan / Recommendation Clinical Impression  Pt's sister present and assisting pt with his breakfast.  She has cared for him for several years since her mother, who cared for him prior, passed. Pt continues with slowed, repetitive mastication, which his sister states is approaching baseline behavior. He consumed copious amounts of thin liquid, which he tolerated well and without s/s of aspiration.  While chewing is laborious, his sister reports that he is a "good eater" and rarely leaves any food on his plate. Continue dysphagia 2 diet, thin liquids, with crushed meds.     HPI HPI: Ptis an 82 y.o.male presenting with AMS and found to have sepsis from UTI. PMH includes dementia, HTN, DM, seizures, PNA, dehydration, and depression. Pt has had swallow studies in the past, including MBS in 2015 that recommended Dys 3 diet and thin liquids. Since then he has had three clinical evaluations, and although he has been able to stay on thin liquids, his most recent diet recommendation was for Dys 1 textures due in large part to his cognitive status.      SLP Plan  Continue with current plan of care       Recommendations  Diet recommendations: Dysphagia 2 (fine chop);Thin liquid Liquids provided via: Straw Medication Administration: Crushed with puree Supervision: Staff to assist with self feeding;Full supervision/cueing for compensatory strategies Compensations: Slow rate;Small sips/bites;Lingual sweep for clearance of pocketing;Minimize environmental distractions;Follow solids with liquid Postural Changes and/or Swallow Maneuvers: Seated upright 90 degrees                Oral Care Recommendations: Oral care BID Follow up Recommendations: Skilled Nursing facility SLP Visit  Diagnosis: Dysphagia, unspecified (R13.10) Plan: Continue with current plan of care       GO                Eric Cameron, Eric Cameron 02/10/2017, 10:44 AM

## 2017-02-10 NOTE — Progress Notes (Signed)
Physical Therapy Treatment Patient Details Name: Eric Cameron MRN: [id number] DOB: 11/20/1934 Today's Date: 02/10/2017    History of Present Illness Pt adm with sepsis due to UTI. PMH - dementia, DM, CVA    PT Comments    Pt remains to respond to simple direct commands with better carryover.  Pt striggles with multiple step commands.  Pt with bowel incontinence on arrival and required multiple reps of rolling.  Pt eager to get OOB.  Plan for SNF remains appropriate as patient remains at risk for falling with out physical assistance.     Follow Up Recommendations  SNF     Equipment Recommendations       Recommendations for Other Services       Precautions / Restrictions Precautions Precautions: Fall Precaution Comments: enteric precautions Restrictions Weight Bearing Restrictions: No    Mobility  Bed Mobility Overal bed mobility: Needs Assistance Bed Mobility: Supine to Sit;Sit to Supine Rolling: Supervision   Supine to sit: Min guard     General bed mobility comments: MIn guard for safety as patient impulsive.  Pt with fair sitting balance edge of bed.    Transfers Overall transfer level: Needs assistance Equipment used: Rolling walker (2 wheeled) Transfers: Sit to/from Stand Sit to Stand: Min assist         General transfer comment: Min assist to boost into standing.  MIn assist to steady balance and for hand placement on RW.    Ambulation/Gait Ambulation/Gait assistance: Mod assist;+2 safety/equipment Ambulation Distance (Feet): 150 Feet Assistive device: Rolling walker (2 wheeled) Gait Pattern/deviations: Step-through pattern;Scissoring;Ataxic;Drifts right/left;Leaning posteriorly Gait velocity: decr Gait velocity interpretation: Below normal speed for age/gender General Gait Details: Assist for balance and support. Pt with bil knee flexion throughout. Amount of assist decr from mod to min as distance increased, patient required assistance for obstacle  negotiation in halls.     Stairs            Wheelchair Mobility    Modified Rankin (Stroke Patients Only)       Balance     Sitting balance-Leahy Scale: Fair       Standing balance-Leahy Scale: Poor                              Cognition Arousal/Alertness: Awake/alert Behavior During Therapy: Impulsive Overall Cognitive Status: No family/caregiver present to determine baseline cognitive functioning                                 General Comments: dementia at baseline      Exercises General Exercises - Lower Extremity Ankle Circles/Pumps: AAROM;10 reps;Supine Long Arc Quad: AROM;Both;5 reps;Seated Hip ABduction/ADduction: PROM;Both (x4 reps unable to understand directions. ) Hip Flexion/Marching: AROM;Both;10 reps;Seated    General Comments        Pertinent Vitals/Pain Pain Assessment: Faces Faces Pain Scale: Hurts little more Pain Location: bottom during pericare and sitting in recliner Pain Descriptors / Indicators: Grimacing Pain Intervention(s): Monitored during session;Repositioned    Home Living                      Prior Function            PT Goals (current goals can now be found in the care plan section) Acute Rehab PT Goals Patient Stated Goal: Pt didn't state Potential to Achieve Goals: Good Progress towards PT goals:  Progressing toward goals    Frequency    Min 3X/week      PT Plan Current plan remains appropriate    Co-evaluation              AM-PAC PT "6 Clicks" Daily Activity  Outcome Measure  Difficulty turning over in bed (including adjusting bedclothes, sheets and blankets)?: A Little Difficulty moving from lying on back to sitting on the side of the bed? : Unable Difficulty sitting down on and standing up from a chair with arms (e.g., wheelchair, bedside commode, etc,.)?: Unable Help needed moving to and from a bed to chair (including a wheelchair)?: A Lot Help needed  walking in hospital room?: A Lot Help needed climbing 3-5 steps with a railing? : Total 6 Click Score: 10    End of Session Equipment Utilized During Treatment: Gait belt Activity Tolerance: Patient tolerated treatment well Patient left: with call bell/phone within reach;in chair;with chair alarm set Nurse Communication: Mobility status (pain in bottom) PT Visit Diagnosis: Unsteadiness on feet (R26.81);Other abnormalities of gait and mobility (R26.89);Muscle weakness (generalized) (M62.81)     Time: [id number] PT Time Calculation (min) (ACUTE ONLY): 31 min  Charges:  $Gait Training: 8-22 mins $Therapeutic Activity: 8-22 mins                    G Codes:       Joycelyn Ruaimee Vaughn Frieze, PTA pager [phone]    Florestine Aversimee J Jessup Ogas 02/10/2017, 5:33 PM

## 2017-02-10 NOTE — Progress Notes (Addendum)
Hypoglycemic Event  CBG: 68  Treatment: 15 GM carbohydrate snack  Symptoms: Pale  Follow-up CBG: Time:0820 CBG Result:87  Possible Reasons for Event: Inadequate meal intake  Comments/MD notified: Yes. MD. Alvino Chapelhoi notified.    Chrissie Dacquisto Matilde Sprang Daeshon Grammatico

## 2017-02-11 ENCOUNTER — Inpatient Hospital Stay (HOSPITAL_COMMUNITY): Payer: Medicare Other

## 2017-02-11 LAB — GLUCOSE, CAPILLARY
GLUCOSE-CAPILLARY: 163 mg/dL — AB (ref 65–99)
GLUCOSE-CAPILLARY: 119 mg/dL — AB (ref 65–99)
GLUCOSE-CAPILLARY: 184 mg/dL — AB (ref 65–99)
GLUCOSE-CAPILLARY: 179 mg/dL — AB (ref 65–99)

## 2017-02-11 LAB — CBC WITH DIFFERENTIAL/PLATELET
Basophils Absolute: 0 10*3/uL (ref 0.0–0.1)
Basophils Relative: 0 %
Eosinophils Absolute: 0.1 10*3/uL (ref 0.0–0.7)
Eosinophils Relative: 1 %
HEMATOCRIT: 25.6 % — AB (ref 39.0–52.0)
Hemoglobin: 7.9 g/dL — ABNORMAL LOW (ref 13.0–17.0)
LYMPHS ABS: 2.4 10*3/uL (ref 0.7–4.0)
LYMPHS PCT: 15 %
MCH: 26.5 pg (ref 26.0–34.0)
MCHC: 30.9 g/dL (ref 30.0–36.0)
MCV: 85.9 fL (ref 78.0–100.0)
MONO ABS: 1.1 10*3/uL — AB (ref 0.1–1.0)
MONOS PCT: 7 %
NEUTROS ABS: 11.9 10*3/uL — AB (ref 1.7–7.7)
Neutrophils Relative %: 77 %
Platelets: 444 10*3/uL — ABNORMAL HIGH (ref 150–400)
RBC: 2.98 MIL/uL — ABNORMAL LOW (ref 4.22–5.81)
RDW: 17.3 % — AB (ref 11.5–15.5)
WBC: 15.5 10*3/uL — ABNORMAL HIGH (ref 4.0–10.5)

## 2017-02-11 LAB — BASIC METABOLIC PANEL
ANION GAP: 10 (ref 5–15)
BUN: 9 mg/dL (ref 6–20)
CALCIUM: 8.4 mg/dL — AB (ref 8.9–10.3)
CHLORIDE: 100 mmol/L — AB (ref 101–111)
CO2: 25 mmol/L (ref 22–32)
Creatinine, Ser: 0.66 mg/dL (ref 0.61–1.24)
GFR calc non Af Amer: >60 mL/min (ref >60)
Glucose, Bld: 105 mg/dL — ABNORMAL HIGH (ref 65–99)
Potassium: 4.9 mmol/L (ref 3.5–5.1)
SODIUM: 135 mmol/L (ref 135–145)

## 2017-02-11 LAB — MAGNESIUM: MAGNESIUM: 1.9 mg/dL (ref 1.7–2.4)

## 2017-02-11 MED ORDER — FLUCONAZOLE 100 MG PO TABS: 100.0000 mg | ORAL_TABLET | Freq: Every day | ORAL | 0 refills | Status: AC

## 2017-02-11 MED ORDER — INSULIN ASPART 100 UNIT/ML ~~LOC~~ SOLN
0.0000 [IU] | Freq: Three times a day (TID) | SUBCUTANEOUS | Status: DC
  Administered 2017-02-11: 2 [IU] via SUBCUTANEOUS

## 2017-02-11 MED ORDER — CIPROFLOXACIN HCL 500 MG PO TABS: 500.0000 mg | ORAL_TABLET | Freq: Two times a day (BID) | ORAL | 0 refills | Status: AC

## 2017-02-11 NOTE — Progress Notes (Signed)
Pt prepared for d/c to SNF. IV d/c'd. Skin intact except as charted in most recent assessments. Vitals are stable. Report called to receiving facility. Pt to be transported by ambulance service. 

## 2017-02-11 NOTE — Discharge Summary (Addendum)
Physician Discharge Summary  Shedrick Sarli QIH:[id number] DOB: 1935-03-21 DOA: 02/01/2017  PCP: No primary care provider on file.  Admit date: 02/01/2017 Discharge date: 02/11/2017  Admitted From: Home Disposition:  SNF  Recommendations for Outpatient Follow-up:  1. Please obtain BMP/CBC in 1 week  2. Recommend Palliative Care referral and follow up as outpatient   Discharge Condition: Stable CODE STATUS: DNR  Diet recommendation: Dysphagia 2 (fine chop);Thin liquid Liquids provided via: Straw Medication Administration: Crushed with puree Supervision: Staff to assist with self feeding;Full supervision/cueing for compensatory strategies Compensations: Slow rate;Small sips/bites;Lingual sweep for clearance of pocketing;Minimize environmental distractions;Follow solids with liquid Postural Changes and/or Swallow Maneuvers: Seated upright 90 degrees  Brief/Interim Summary: Eric Cameron is a 82 yo male with past medical history of dementia, type 2 diabetes, CVA who presented with chief complaint of weakness and alter mentation per family. He was found to have greater than 100,000 colonies for yeast in his urine. He was admitted for sepsis secondary to UTI as well as presumed DKA (with hyperglycemia, although anion gap was only 8). He was empirically started on vancomycin and Rocephin, which was transitioned to Diflucan. Zosyn was started due to persistent fever, tachycardia and hypotension. With improvement in hemodynamics, zosyn was stopped. However, he continued to have elevation in WBC and then had episodes of diarrhea. Stool PCR is positive for enteropathogenic E Coli. As patient has been septic, he was empirically started on cipro. WBC continued to improve. Patient was discharged to SNF in stable condition.   Discharge Diagnoses:  Principal Problem:   Severe sepsis (HCC) Active Problems:   HTN (hypertension)   Seizures (HCC)   Alzheimer's dementia without behavioral disturbance    UTI (urinary tract infection)   Acute renal failure (ARF) (HCC)   DKA (diabetic ketoacidosis) (HCC)   Protein-calorie malnutrition, severe   Palliative care encounter   Goals of care, counseling/discussion   Pressure injury of skin   Severe protein-calorie malnutrition (HCC)   Hypoglycemia  Sepsis secondary to UTI, Enteropathogenic E Coli -Urine culture showed >100,000 yeast - ?colonizer. Diflucan x 14 total days (last day 11/1) -Blood culture with 1 of 2 coag neg staph - contaminant  -Due to increasing WBC, as well as fever and hypotension few days ago as well as increase in BM, stool PCR was checked. Positive for EPEC. We will elect to empirically treat with antibiotics due to worsening sepsis pathology. Started cipro. Diarrhea is not watery, low concern for C Diff. Improving WBC, afebrile now, BP stable. Continue cipro (last day 11/6)   Type 2 diabetes with hyperglycemia -Lantus, resume home metformin   Acute kidney injury -Likely prerenal in etiology -Baseline creatinine 0.8-0.9 -Resolved back to normal. Resume lisinopril.   Hypovolemic hypernatremia -Resolved   Advanced Alzheimer dementia -Supportive care, resume home namenda   Stage I pressure ulcer on lower sacrum, present on admission -Supportive care  Seizures -No longer on antiepileptics -Monitor   Fall -Found on ground overnight 10/26-10/27. Unwitnessed fall. CT head without acute abnormalities.    Discharge Instructions  Discharge Instructions    Discharge instructions    Complete by:  As directed    You were cared for by a hospitalist during your hospital stay. If you have any questions about your discharge medications or the care you received while you were in the hospital after you are discharged, you can call the unit and asked to speak with the hospitalist on call if the hospitalist that took care of you is not available. Once you  are discharged, your primary care physician will handle any further  medical issues. Please note that NO REFILLS for any discharge medications will be authorized once you are discharged, as it is imperative that you return to your primary care physician (or establish a relationship with a primary care physician if you do not have one) for your aftercare needs so that they can reassess your need for medications and monitor your lab values.   Increase activity slowly    Complete by:  As directed      Allergies as of 02/11/2017   No Known Allergies     Medication List    TAKE these medications   aspirin 325 MG tablet Take 1 tablet (325 mg total) by mouth daily.   atorvastatin 20 MG tablet Commonly known as:  LIPITOR Take 20 mg by mouth daily.   ciprofloxacin 500 MG tablet Commonly known as:  CIPRO Take 1 tablet (500 mg total) by mouth 2 (two) times daily.   fluconazole 100 MG tablet Commonly known as:  DIFLUCAN Take 1 tablet (100 mg total) by mouth daily.   folic acid 1 MG tablet Commonly known as:  FOLVITE Take 1 tablet (1 mg total) by mouth daily.   HIGH PROTEIN PO Take 30 mLs by mouth 2 (two) times daily.   insulin glargine 100 UNIT/ML injection Commonly known as:  LANTUS Inject 0.05 mLs (5 Units total) into the skin at bedtime.   lisinopril 2.5 MG tablet Commonly known as:  PRINIVIL,ZESTRIL Take 2.5 mg by mouth daily.   memantine 10 MG tablet Commonly known as:  NAMENDA Take 10 mg by mouth 2 (two) times daily.   metFORMIN 500 MG 24 hr tablet Commonly known as:  GLUCOPHAGE-XR Take 1,000 mg by mouth daily.   NUTRITIONAL SUPPLEMENT PLUS Liqd Take 1 Bottle by mouth 2 (two) times daily. Med Pass 120 mL   thiamine 100 MG tablet Take 1 tablet (100 mg total) by mouth daily.   vitamin B-12 100 MCG tablet Commonly known as:  CYANOCOBALAMIN Take 100 mcg by mouth daily.       No Known Allergies  Consultations:  Palliative Care    Procedures/Studies: Ct Head Wo Contrast  Result Date: 02/11/2017 CLINICAL DATA:  Unwitnessed  fall out of bed.  Initial encounter. EXAM: CT HEAD WITHOUT CONTRAST TECHNIQUE: Contiguous axial images were obtained from the base of the skull through the vertex without intravenous contrast. COMPARISON:  05/29/2016 FINDINGS: Brain: There is no evidence of acute infarct, intracranial hemorrhage, mass, midline shift, or extra-axial fluid collection. There is mild-to-moderate cerebral atrophy. Cerebral white matter hypodensities are similar to the prior study and nonspecific but compatible with mild chronic small vessel ischemic disease. Vascular: Calcified atherosclerosis at the skullbase. No hyperdense vessel. Skull: No fracture or focal osseous lesion. Sinuses/Orbits: Minimal right sphenoid sinus debris. Clear mastoid air cells. Unremarkable orbits. Other: None. IMPRESSION: 1. No evidence of acute intracranial abnormality. 2. Mild chronic small vessel ischemic disease and cerebral atrophy. Electronically Signed   By: Sebastian AcheAllen  Grady M.D.   On: 02/11/2017 07:18   Dg Chest Port 1 View  Result Date: 02/01/2017 CLINICAL DATA:  Altered mental status, question sepsis. EXAM: PORTABLE CHEST 1 VIEW COMPARISON:  06/08/2016 FINDINGS: The heart size and mediastinal contours are within normal limits. There is minimal atelectasis at the left lung base. There is mild uncoiling of the thoracic aorta, similar in appearance to prior. No acute nor suspicious osseous abnormalities. High-riding humeral heads are noted bilaterally which may reflect chronic  rotator cuff tears. IMPRESSION: No active disease. Electronically Signed   By: Tollie Eth M.D.   On: 02/01/2017 19:01       Discharge Exam: Vitals:   02/11/17 0630 02/11/17 0700  BP: 97/61 95/61  Pulse: 86 82  Resp:    Temp:    SpO2: 100%    Vitals:   02/11/17 0530 02/11/17 0600 02/11/17 0630 02/11/17 0700  BP: (!) 90/58 95/69 97/61  95/61  Pulse: 81 86 86 82  Resp:      Temp:      TempSrc:      SpO2: 100% 100% 100%   Weight:      Height:        General:  Pt is alert, awake, not in acute distress, frail appearing Cardiovascular: RRR, S1/S2 +, no rubs, no gallops Respiratory: CTA bilaterally, no wheezing, no rhonchi Abdominal: Soft, NT, ND, bowel sounds + Extremities: no edema, no cyanosis Psych: advanced dementia, alert but not oriented    The results of significant diagnostics from this hospitalization (including imaging, microbiology, ancillary and laboratory) are listed below for reference.     Microbiology: Recent Results (from the past 240 hour(s))  Culture, blood (Routine x 2)     Status: None   Collection Time: 02/01/17  4:40 PM  Result Value Ref Range Status   Specimen Description BLOOD RIGHT HAND  Final   Special Requests   Final    BOTTLES DRAWN AEROBIC ONLY Blood Culture adequate volume   Culture NO GROWTH 5 DAYS  Final   Report Status 02/06/2017 FINAL  Final  Urine Culture     Status: Abnormal   Collection Time: 02/01/17  5:28 PM  Result Value Ref Range Status   Specimen Description URINE, CATHETERIZED  Final   Special Requests NONE  Final   Culture >=100,000 COLONIES/mL YEAST (A)  Final   Report Status 02/02/2017 FINAL  Final  Culture, blood (Routine x 2)     Status: Abnormal   Collection Time: 02/01/17  6:05 PM  Result Value Ref Range Status   Specimen Description BLOOD RIGHT ANTECUBITAL  Final   Special Requests IN PEDIATRIC BOTTLE Blood Culture adequate volume  Final   Culture  Setup Time   Final    GRAM POSITIVE COCCI IN CLUSTERS IN PEDIATRIC BOTTLE CRITICAL RESULT CALLED TO, READ BACK BY AND VERIFIED WITH: A JOHNSTON PHARMD 2239 02/02/17 A BROWNING    Culture (A)  Final    STAPHYLOCOCCUS SPECIES (COAGULASE NEGATIVE) THE SIGNIFICANCE OF ISOLATING THIS ORGANISM FROM A SINGLE SET OF BLOOD CULTURES WHEN MULTIPLE SETS ARE DRAWN IS UNCERTAIN. PLEASE NOTIFY THE MICROBIOLOGY DEPARTMENT WITHIN ONE WEEK IF SPECIATION AND SENSITIVITIES ARE REQUIRED.    Report Status 02/03/2017 FINAL  Final  Blood Culture ID Panel  (Reflexed)     Status: None   Collection Time: 02/01/17  6:05 PM  Result Value Ref Range Status   Enterococcus species NOT DETECTED NOT DETECTED Final   Listeria monocytogenes NOT DETECTED NOT DETECTED Final   Staphylococcus species NOT DETECTED NOT DETECTED Final   Staphylococcus aureus NOT DETECTED NOT DETECTED Final   Streptococcus species NOT DETECTED NOT DETECTED Final   Streptococcus agalactiae NOT DETECTED NOT DETECTED Final   Streptococcus pneumoniae NOT DETECTED NOT DETECTED Final   Streptococcus pyogenes NOT DETECTED NOT DETECTED Final   Acinetobacter baumannii NOT DETECTED NOT DETECTED Final   Enterobacteriaceae species NOT DETECTED NOT DETECTED Final   Enterobacter cloacae complex NOT DETECTED NOT DETECTED Final   Escherichia coli NOT  DETECTED NOT DETECTED Final   Klebsiella oxytoca NOT DETECTED NOT DETECTED Final   Klebsiella pneumoniae NOT DETECTED NOT DETECTED Final   Proteus species NOT DETECTED NOT DETECTED Final   Serratia marcescens NOT DETECTED NOT DETECTED Final   Haemophilus influenzae NOT DETECTED NOT DETECTED Final   Neisseria meningitidis NOT DETECTED NOT DETECTED Final   Pseudomonas aeruginosa NOT DETECTED NOT DETECTED Final   Candida albicans NOT DETECTED NOT DETECTED Final   Candida glabrata NOT DETECTED NOT DETECTED Final   Candida krusei NOT DETECTED NOT DETECTED Final   Candida parapsilosis NOT DETECTED NOT DETECTED Final   Candida tropicalis NOT DETECTED NOT DETECTED Final  MRSA PCR Screening     Status: None   Collection Time: 02/01/17 11:15 PM  Result Value Ref Range Status   MRSA by PCR NEGATIVE NEGATIVE Final    Comment:        The GeneXpert MRSA Assay (FDA approved for NASAL specimens only), is one component of a comprehensive MRSA colonization surveillance program. It is not intended to diagnose MRSA infection nor to guide or monitor treatment for MRSA infections.   Gastrointestinal Panel by PCR , Stool     Status: Abnormal   Collection  Time: 02/09/17 10:13 AM  Result Value Ref Range Status   Campylobacter species NOT DETECTED NOT DETECTED Final   Plesimonas shigelloides NOT DETECTED NOT DETECTED Final   Salmonella species NOT DETECTED NOT DETECTED Final   Yersinia enterocolitica NOT DETECTED NOT DETECTED Final   Vibrio species NOT DETECTED NOT DETECTED Final   Vibrio cholerae NOT DETECTED NOT DETECTED Final   Enteroaggregative E coli (EAEC) NOT DETECTED NOT DETECTED Final   Enteropathogenic E coli (EPEC) DETECTED (A) NOT DETECTED Final    Comment: RESULT CALLED TO, READ BACK BY AND VERIFIED WITH: IRETI OMOSEEBI AT 1728 ON 02/09/2017 JJB    Enterotoxigenic E coli (ETEC) NOT DETECTED NOT DETECTED Final   Shiga like toxin producing E coli (STEC) NOT DETECTED NOT DETECTED Final   Shigella/Enteroinvasive E coli (EIEC) NOT DETECTED NOT DETECTED Final   Cryptosporidium NOT DETECTED NOT DETECTED Final   Cyclospora cayetanensis NOT DETECTED NOT DETECTED Final   Entamoeba histolytica NOT DETECTED NOT DETECTED Final   Giardia lamblia NOT DETECTED NOT DETECTED Final   Adenovirus F40/41 NOT DETECTED NOT DETECTED Final   Astrovirus NOT DETECTED NOT DETECTED Final   Norovirus GI/GII NOT DETECTED NOT DETECTED Final   Rotavirus A NOT DETECTED NOT DETECTED Final   Sapovirus (I, II, IV, and V) NOT DETECTED NOT DETECTED Final     Labs: BNP (last 3 results) No results for input(s): BNP in the last 8760 hours. Basic Metabolic Panel:  Recent Labs Lab 02/07/17 1813 02/08/17 0727 02/08/17 0824 02/08/17 0950 02/09/17 0513 02/10/17 0650 02/11/17 0752  NA 142  --  145  --  141 137 135  K 3.0*  --  2.7*  --  3.1* 3.3* 4.9  CL 116*  --  115*  --  110 103 100*  CO2 20*  --  22  --  23 28 25   GLUCOSE 271*  --  84  --  132* 71 105*  BUN 26*  --  19  --  8 8 9   CREATININE 0.86 0.73 0.70  --  0.55* 0.51* 0.66  CALCIUM 8.4*  --  8.3*  --  7.9* 8.1* 8.4*  MG  --   --   --  1.5* 1.7 1.6* 1.9   Liver Function Tests: No results for  input(s): AST, ALT, ALKPHOS, BILITOT, PROT, ALBUMIN in the last 168 hours. No results for input(s): LIPASE, AMYLASE in the last 168 hours. No results for input(s): AMMONIA in the last 168 hours. CBC:  Recent Labs Lab 02/07/17 0817 02/08/17 0824 02/09/17 0513 02/10/17 0650 02/11/17 0752  WBC 14.3* 16.0* 19.3* 18.9* 15.5*  NEUTROABS  --   --  16.3* 15.4* 11.9*  HGB 7.1* 7.8* 7.6* 7.6* 7.9*  HCT 22.4* 25.6* 24.3* 24.4* 25.6*  MCV 84.8 85.6 85.9 85.9 85.9  PLT 373 421* 387 418* 444*   Cardiac Enzymes: No results for input(s): CKTOTAL, CKMB, CKMBINDEX, TROPONINI in the last 168 hours. BNP: Invalid input(s): POCBNP CBG:  Recent Labs Lab 02/10/17 0844 02/10/17 1206 02/10/17 1640 02/10/17 2009 02/11/17 0750  GLUCAP 87 204* 163* 154* 119*   D-Dimer No results for input(s): DDIMER in the last 72 hours. Hgb A1c No results for input(s): HGBA1C in the last 72 hours. Lipid Profile No results for input(s): CHOL, HDL, LDLCALC, TRIG, CHOLHDL, LDLDIRECT in the last 72 hours. Thyroid function studies No results for input(s): TSH, T4TOTAL, T3FREE, THYROIDAB in the last 72 hours.  Invalid input(s): FREET3 Anemia work up No results for input(s): VITAMINB12, FOLATE, FERRITIN, TIBC, IRON, RETICCTPCT in the last 72 hours. Urinalysis    Component Value Date/Time   COLORURINE YELLOW 02/01/2017 1728   APPEARANCEUR TURBID (A) 02/01/2017 1728   LABSPEC 1.013 02/01/2017 1728   PHURINE 5.0 02/01/2017 1728   GLUCOSEU NEGATIVE 02/01/2017 1728   HGBUR MODERATE (A) 02/01/2017 1728   BILIRUBINUR NEGATIVE 02/01/2017 1728   KETONESUR NEGATIVE 02/01/2017 1728   PROTEINUR 100 (A) 02/01/2017 1728   UROBILINOGEN 1.0 11/30/2013 1734   NITRITE NEGATIVE 02/01/2017 1728   LEUKOCYTESUR MODERATE (A) 02/01/2017 1728   Sepsis Labs Invalid input(s): PROCALCITONIN,  WBC,  LACTICIDVEN Microbiology Recent Results (from the past 240 hour(s))  Culture, blood (Routine x 2)     Status: None   Collection  Time: 02/01/17  4:40 PM  Result Value Ref Range Status   Specimen Description BLOOD RIGHT HAND  Final   Special Requests   Final    BOTTLES DRAWN AEROBIC ONLY Blood Culture adequate volume   Culture NO GROWTH 5 DAYS  Final   Report Status 02/06/2017 FINAL  Final  Urine Culture     Status: Abnormal   Collection Time: 02/01/17  5:28 PM  Result Value Ref Range Status   Specimen Description URINE, CATHETERIZED  Final   Special Requests NONE  Final   Culture >=100,000 COLONIES/mL YEAST (A)  Final   Report Status 02/02/2017 FINAL  Final  Culture, blood (Routine x 2)     Status: Abnormal   Collection Time: 02/01/17  6:05 PM  Result Value Ref Range Status   Specimen Description BLOOD RIGHT ANTECUBITAL  Final   Special Requests IN PEDIATRIC BOTTLE Blood Culture adequate volume  Final   Culture  Setup Time   Final    GRAM POSITIVE COCCI IN CLUSTERS IN PEDIATRIC BOTTLE CRITICAL RESULT CALLED TO, READ BACK BY AND VERIFIED WITH: A JOHNSTON PHARMD 2239 02/02/17 A BROWNING    Culture (A)  Final    STAPHYLOCOCCUS SPECIES (COAGULASE NEGATIVE) THE SIGNIFICANCE OF ISOLATING THIS ORGANISM FROM A SINGLE SET OF BLOOD CULTURES WHEN MULTIPLE SETS ARE DRAWN IS UNCERTAIN. PLEASE NOTIFY THE MICROBIOLOGY DEPARTMENT WITHIN ONE WEEK IF SPECIATION AND SENSITIVITIES ARE REQUIRED.    Report Status 02/03/2017 FINAL  Final  Blood Culture ID Panel (Reflexed)     Status: None  Collection Time: 02/01/17  6:05 PM  Result Value Ref Range Status   Enterococcus species NOT DETECTED NOT DETECTED Final   Listeria monocytogenes NOT DETECTED NOT DETECTED Final   Staphylococcus species NOT DETECTED NOT DETECTED Final   Staphylococcus aureus NOT DETECTED NOT DETECTED Final   Streptococcus species NOT DETECTED NOT DETECTED Final   Streptococcus agalactiae NOT DETECTED NOT DETECTED Final   Streptococcus pneumoniae NOT DETECTED NOT DETECTED Final   Streptococcus pyogenes NOT DETECTED NOT DETECTED Final   Acinetobacter  baumannii NOT DETECTED NOT DETECTED Final   Enterobacteriaceae species NOT DETECTED NOT DETECTED Final   Enterobacter cloacae complex NOT DETECTED NOT DETECTED Final   Escherichia coli NOT DETECTED NOT DETECTED Final   Klebsiella oxytoca NOT DETECTED NOT DETECTED Final   Klebsiella pneumoniae NOT DETECTED NOT DETECTED Final   Proteus species NOT DETECTED NOT DETECTED Final   Serratia marcescens NOT DETECTED NOT DETECTED Final   Haemophilus influenzae NOT DETECTED NOT DETECTED Final   Neisseria meningitidis NOT DETECTED NOT DETECTED Final   Pseudomonas aeruginosa NOT DETECTED NOT DETECTED Final   Candida albicans NOT DETECTED NOT DETECTED Final   Candida glabrata NOT DETECTED NOT DETECTED Final   Candida krusei NOT DETECTED NOT DETECTED Final   Candida parapsilosis NOT DETECTED NOT DETECTED Final   Candida tropicalis NOT DETECTED NOT DETECTED Final  MRSA PCR Screening     Status: None   Collection Time: 02/01/17 11:15 PM  Result Value Ref Range Status   MRSA by PCR NEGATIVE NEGATIVE Final    Comment:        The GeneXpert MRSA Assay (FDA approved for NASAL specimens only), is one component of a comprehensive MRSA colonization surveillance program. It is not intended to diagnose MRSA infection nor to guide or monitor treatment for MRSA infections.   Gastrointestinal Panel by PCR , Stool     Status: Abnormal   Collection Time: 02/09/17 10:13 AM  Result Value Ref Range Status   Campylobacter species NOT DETECTED NOT DETECTED Final   Plesimonas shigelloides NOT DETECTED NOT DETECTED Final   Salmonella species NOT DETECTED NOT DETECTED Final   Yersinia enterocolitica NOT DETECTED NOT DETECTED Final   Vibrio species NOT DETECTED NOT DETECTED Final   Vibrio cholerae NOT DETECTED NOT DETECTED Final   Enteroaggregative E coli (EAEC) NOT DETECTED NOT DETECTED Final   Enteropathogenic E coli (EPEC) DETECTED (A) NOT DETECTED Final    Comment: RESULT CALLED TO, READ BACK BY AND VERIFIED  WITH: IRETI OMOSEEBI AT 1728 ON 02/09/2017 JJB    Enterotoxigenic E coli (ETEC) NOT DETECTED NOT DETECTED Final   Shiga like toxin producing E coli (STEC) NOT DETECTED NOT DETECTED Final   Shigella/Enteroinvasive E coli (EIEC) NOT DETECTED NOT DETECTED Final   Cryptosporidium NOT DETECTED NOT DETECTED Final   Cyclospora cayetanensis NOT DETECTED NOT DETECTED Final   Entamoeba histolytica NOT DETECTED NOT DETECTED Final   Giardia lamblia NOT DETECTED NOT DETECTED Final   Adenovirus F40/41 NOT DETECTED NOT DETECTED Final   Astrovirus NOT DETECTED NOT DETECTED Final   Norovirus GI/GII NOT DETECTED NOT DETECTED Final   Rotavirus A NOT DETECTED NOT DETECTED Final   Sapovirus (I, II, IV, and V) NOT DETECTED NOT DETECTED Final     Time coordinating discharge: 40 minutes  SIGNED:  Noralee Stain, DO [facility] Pager [phone]  If 7PM-7AM, please contact night-coverage [email or web address] Password [email or web address] 02/11/2017, 10:30 AM

## 2017-02-11 NOTE — Clinical Social Work Placement (Signed)
Pt is ready for discharge today and will [facility] and communicated with Clydie BraunKaren for room and report. RN Maralyn SagoSarah to call report to [phone] for room 108. CSW arranged with [facility]. CSW is signing off as no further needs identified.  Corlis HoveJeneya Valoria Tamburri, Theresia MajorsLCSWA, LCASA Clinical Social Worker [facility]) [phone]  CLINICAL SOCIAL WORK PLACEMENT  NOTE  Date:  02/11/2017  Patient Details  Name: Eric CopaRobert Cameron MRN: [id number] Date of Birth: [birth year]  Clinical Social Work is seeking post-discharge placement for this patient at the Skilled  Nursing Facility level of care (*CSW will initial, date and re-position this form in  chart as items are completed):  Yes   Patient/family provided with [facility] Clinical Social Work Department's list of facilities offering this level of care within the geographic area requested by the patient (or if unable, by the patient's family).  Yes   Patient/family informed of their freedom to choose among providers that offer the needed level of care, that participate in Medicare, Medicaid or managed care program needed by the patient, have an available bed and are willing to accept the patient.  Yes   Patient/family informed of [facility]'s ownership interest in [facility] and [facility], as well as of the fact that they are under no obligation to receive care at these facilities.  PASRR submitted to EDS on       PASRR number received on       Existing PASRR number confirmed on 02/06/17     FL2 transmitted to all facilities in geographic area requested by pt/family on 02/06/17     FL2 transmitted to all facilities within larger geographic area on       Patient informed that his/her managed care company has contracts with or will negotiate with certain facilities, including the following:        Yes   Patient/family informed of bed offers received.  [facility]     Physician recommends and patient chooses bed at      [facility] 02[facility]11/[facility].  Patient to be transferred to facility by [facility]     Patient family notified on 02/11/17 of transfer.  Name of family member notified:  Eric Cameron - [phone]- [phone]     PHYSICIAN Please prepare prescriptions     Additional Comment:    _______________________________________________ Eric PeaJeneya G Mackenze [place], LCSW 02/11/2017, 5:07 PM

## 2017-02-11 NOTE — Progress Notes (Signed)
02/11/17 0504  What Happened  Was fall witnessed? No  Was patient injured? Unsure  Patient found on floor  Found by Staff-comment Banker(RN)  Stated prior activity other (comment) (confused; pulled self out of bed in lowest position)  Follow Up  MD notified Elray McgregorMary Lynch, MD  Time MD notified [phone]  Family notified No- patient refusal  Additional tests Yes-comment (stat head CT)  Progress note created (see row info) Yes  Adult Fall Risk Assessment  Risk Factor Category (scoring not indicated) High fall risk per protocol (document High fall risk)  Patient's Fall Risk High Fall Risk (>13 points)  Adult Fall Risk Interventions  Required Bundle Interventions *See Row Information* High fall risk - low, moderate, and high requirements implemented  Additional Interventions PT/OT need assessed if change in mobility from baseline;Reorient/diversional activities with confused patients;Room near nurses station;Use of appropriate toileting equipment (bedpan, BSC, etc.)  Screening for Fall Injury Risk  Risk For Fall Injury- See Row Information  None identified  Required Injury Bundle Interventions *See Row Information* Injury Bundle Implemented  Screening for Fall Injury Risk Interventions  Specialty Low Bed Contraindicated Requires therapeutic low air loss mattress (low bed w/ air mattress)  Additional Interventions PT/OT consult  Vitals  Temp 98 F (36.7 C)  Temp Source Oral  BP 115/70  MAP (mmHg) 80  BP Location Left Arm  BP Method Automatic  Patient Position (if appropriate) Lying  Pulse Rate 85  Pulse Rate Source Dinamap  Cardiac Rhythm NSR  Resp 20  Oxygen Therapy  SpO2 100 %  O2 Device Room Air  Pulse Oximetry Type Intermittent  Pain Assessment  Pain Assessment No/denies pain  PAINAD (Pain Assessment in Advanced Dementia)  Breathing 0  Negative Vocalization 0  Facial Expression 0  Body Language 1  Consolability 1  PAINAD Score 2  PCA/Epidural/Spinal Assessment  Respiratory  Pattern Regular;Unlabored;Symmetrical  Neurological  Neuro (WDL) X  Level of Consciousness Alert  Orientation Level Oriented to person;Disoriented to place;Disoriented to time;Disoriented to situation  Cognition Appropriate at baseline;Follows commands;Poor judgement;Poor safety awareness  Speech Clear  Pupil Assessment  No  Additional Pupil Assessments No  Motor Function/Sensation Assessment Grip;Motor response;Motor strength  R Hand Grip Present;Moderate  L Hand Grip Present;Moderate   R Foot Dorsiflexion Present;Weak  L Foot Dorsiflexion Present;Weak  R Foot Plantar Flexion Present;Weak  L Foot Plantar Flexion Present;Weak  RUE Motor Response Purposeful movement;Responds to commands  RUE Motor Strength 4  LUE Motor Response Purposeful movement;Responds to commands  LUE Motor Strength 4  RLE Motor Response Purposeful movement;Responds to commands  RLE Motor Strength 3  LLE Motor Response Purposeful movement;Responds to commands  LLE Motor Strength 3  Neuro Symptoms None  Neuro Additional Assessments No  Glasgow Coma Scale  Eye Opening 4  Best Verbal Response (NON-intubated) 4  Best Motor Response 6  Glasgow Coma Scale Score 14  Musculoskeletal  Musculoskeletal (WDL) X  Assistive Device None  Generalized Weakness Yes  Weight Bearing Restrictions No  Integumentary  Integumentary (WDL) X  Skin Color Ashen  Skin Condition Dry  Skin Integrity Intact  Skin Turgor Tenting

## 2017-05-17 ENCOUNTER — Encounter: Payer: Self-pay | Admitting: Gastroenterology

## 2017-05-30 ENCOUNTER — Ambulatory Visit (INDEPENDENT_AMBULATORY_CARE_PROVIDER_SITE_OTHER): Payer: Medicaid Other | Admitting: Podiatry

## 2017-05-30 ENCOUNTER — Encounter: Payer: Self-pay | Admitting: Podiatry

## 2017-05-30 DIAGNOSIS — M79674 Pain in right toe(s): Secondary | ICD-10-CM

## 2017-05-30 DIAGNOSIS — M79675 Pain in left toe(s): Secondary | ICD-10-CM | POA: Diagnosis not present

## 2017-05-30 DIAGNOSIS — E1159 Type 2 diabetes mellitus with other circulatory complications: Secondary | ICD-10-CM

## 2017-05-30 DIAGNOSIS — B351 Tinea unguium: Secondary | ICD-10-CM

## 2017-05-30 NOTE — Progress Notes (Signed)
Complaint:  Visit Type: Patient returns to my office for continued preventative foot care services. Complaint: Patient states" my nails have grown long and thick and become painful to walk and wear shoes" Patient has been diagnosed with DM with angiopathy.. The patient presents for preventative foot care services. No changes to ROS.  This patient presents to the office with his sister.  This patient is also diabetic on insulin and medication.  Podiatric Exam: Vascular: dorsalis pedis and posterior tibial pulses are not  palpable bilateral. Capillary return is immediate. Cold feet noted. Skin turgor WNL  Sensorium: Senn weinstein test was deferred since patient is not conversant. Nail Exam: Pt has thick disfigured discolored nails with subungual debris noted bilateral entire nail hallux through fifth toenails except fourth nail left foot which has self avulsed. Ulcer Exam: There is no evidence of ulcer or pre-ulcerative changes or infection. Orthopedic Exam: Muscle tone and strength are WNL. No limitations in general ROM. No crepitus or effusions noted. Foot type and digits show no abnormalities. HAV  B/L.  Hammer toes 2-4  B/L. Skin: No Porokeratosis. No infection or ulcers  Diagnosis:  Onychomycosis, , Pain in right toe, pain in left toes  Treatment & Plan Procedures and Treatment: Consent by patient was obtained for treatment procedures.   Debridement of mycotic and hypertrophic toenails, 1 through 5 bilateral and clearing of subungual debris. No ulceration, no infection noted.  Return Visit-Office Procedure: Patient instructed to return to the office for a follow up visit prn for continued evaluation and treatment.    Helane GuntherGregory Calle Schader DPM

## 2017-05-31 ENCOUNTER — Encounter: Payer: Self-pay | Admitting: Gastroenterology

## 2017-05-31 ENCOUNTER — Other Ambulatory Visit (INDEPENDENT_AMBULATORY_CARE_PROVIDER_SITE_OTHER): Payer: Medicare Other

## 2017-05-31 ENCOUNTER — Ambulatory Visit (INDEPENDENT_AMBULATORY_CARE_PROVIDER_SITE_OTHER): Payer: Medicare Other | Admitting: Gastroenterology

## 2017-05-31 DIAGNOSIS — R634 Abnormal weight loss: Secondary | ICD-10-CM | POA: Diagnosis not present

## 2017-05-31 DIAGNOSIS — D649 Anemia, unspecified: Secondary | ICD-10-CM

## 2017-05-31 LAB — BASIC METABOLIC PANEL
BUN: 27 mg/dL — AB (ref 6–23)
CO2: 27 mEq/L (ref 19–32)
Calcium: 8.8 mg/dL (ref 8.4–10.5)
Chloride: 101 mEq/L (ref 96–112)
Creatinine, Ser: 0.69 mg/dL (ref 0.40–1.50)
GFR: 141.62 mL/min (ref >60.00)
GLUCOSE: 168 mg/dL — AB (ref 70–99)
POTASSIUM: 4 meq/L (ref 3.5–5.1)
Sodium: 136 mEq/L (ref 135–145)

## 2017-05-31 LAB — CBC WITH DIFFERENTIAL/PLATELET
Basophils Absolute: 0.1 10*3/uL (ref 0.0–0.1)
Basophils Relative: 0.7 % (ref 0.0–3.0)
EOS PCT: 0.3 % (ref 0.0–5.0)
Eosinophils Absolute: 0 10*3/uL (ref 0.0–0.7)
HCT: 31.1 % — ABNORMAL LOW (ref 39.0–52.0)
HEMOGLOBIN: 9.7 g/dL — AB (ref 13.0–17.0)
Lymphocytes Relative: 30.5 % (ref 12.0–46.0)
Lymphs Abs: 2.6 10*3/uL (ref 0.7–4.0)
MCHC: 31.1 g/dL (ref 30.0–36.0)
MCV: 90.3 fl (ref 78.0–100.0)
MONO ABS: 0.8 10*3/uL (ref 0.1–1.0)
Monocytes Relative: 8.8 % (ref 3.0–12.0)
Neutro Abs: 5.1 10*3/uL (ref 1.4–7.7)
Neutrophils Relative %: 59.7 % (ref 43.0–77.0)
Platelets: 343 10*3/uL (ref 150.0–400.0)
RBC: 3.45 Mil/uL — ABNORMAL LOW (ref 4.22–5.81)
RDW: 17.2 % — AB (ref 11.5–15.5)
WBC: 8.5 10*3/uL (ref 4.0–10.5)

## 2017-05-31 LAB — IBC PANEL
Iron: 25 ug/dL — ABNORMAL LOW (ref 42–165)
Saturation Ratios: 7 % — ABNORMAL LOW (ref 20.0–50.0)
Transferrin: 254 mg/dL (ref 212.0–360.0)

## 2017-05-31 LAB — VITAMIN B12: Vitamin B-12: 509 pg/mL (ref 211–911)

## 2017-05-31 LAB — FERRITIN: Ferritin: 65.1 ng/mL (ref 22.0–322.0)

## 2017-05-31 LAB — FOLATE: FOLATE: 12.2 ng/mL (ref >5.9)

## 2017-05-31 NOTE — Progress Notes (Signed)
05/31/2017 Eric CopaRobert Cameron [id number] Dec 18, 1934   HISTORY OF PRESENT ILLNESS:  This is an 83 year old male with PMH of advanced dementia and seizures who was sent here by his PCP, Dr. Zoe LanPenny Jones, for evaluation of weight loss.  Is here with his sister and his niece, who are his caregivers.  They say that his weight has been up and down since February 2018, but this is probably the lowest weight that he has been.  Absolutely no GI complaints.  Non-contrast CT scan of the abdomen and pelvis 05/16/2017 was ok.  Unsure why he didn't get contrast.  No GI evaluation in at least the past 10 years according to family (they think that he may have had a colonoscopy 10 years or more ago), but after they left it came to our attention that he may have had colonoscopy at [facility] 5 or 6 years ago so we are looking into that.  Has a normocytic anemia as well.  Most recent Hgb 7.9 grams.  Anemia has been present at least over the past year with Hgb not over 10.4 grams since that time.  No sign of overt GI bleeding.   Past Medical History:  Diagnosis Date  . Acute encephalopathy   . Alzheimer disease   . Dehydration   . Dementia   . Depression   . Diabetes mellitus without complication (HCC)   . Elevated troponin   . Hypertension   . PNA (pneumonia)   . Pressure ulcer   . Seizures (HCC)    Past Surgical History:  Procedure Laterality Date  . NO PAST SURGERIES      reports that he quit smoking about 6 years ago. he has never used smokeless tobacco. He reports that he does not drink alcohol or use drugs. family history includes Colon cancer in his father; Dementia in his mother; Diabetes Mellitus II in his brother. No Known Allergies    Outpatient Encounter Medications as of 05/31/2017  Medication Sig  . Amino Acids (HIGH PROTEIN PO) Take 30 mLs by mouth 2 (two) times daily.   Marland Kitchen. aspirin 325 MG tablet Take 1 tablet (325 mg total) by mouth daily.  Marland Kitchen. atorvastatin (LIPITOR) 20 MG tablet Take 20 mg  by mouth daily.  . folic acid (FOLVITE) 1 MG tablet Take 1 tablet (1 mg total) by mouth daily.  Marland Kitchen. lisinopril (PRINIVIL,ZESTRIL) 2.5 MG tablet Take 2.5 mg by mouth daily.  . memantine (NAMENDA) 10 MG tablet Take 10 mg by mouth 2 (two) times daily.   . metFORMIN (GLUCOPHAGE-XR) 500 MG 24 hr tablet Take 1,000 mg by mouth daily.  . Nutritional Supplements (NUTRITIONAL SUPPLEMENT PLUS) LIQD Take 1 Bottle by mouth 2 (two) times daily. Med Pass 120 mL  . thiamine 100 MG tablet Take 1 tablet (100 mg total) by mouth daily.  . vitamin B-12 (CYANOCOBALAMIN) 100 MCG tablet Take 100 mcg by mouth daily.  . [DISCONTINUED] insulin glargine (LANTUS) 100 UNIT/ML injection Inject 0.05 mLs (5 Units total) into the skin at bedtime. (Patient not taking: Reported on 02/01/2017)   No facility-administered encounter medications on file as of 05/31/2017.      REVIEW OF SYSTEMS  : All other systems reviewed and negative except where noted in the History of Present Illness.   PHYSICAL EXAM: Ht 5\' 6"  (1.676 m) Comment: height measured without shoes  Wt 113 lb 6 oz (51.4 kg)   BMI 18.30 kg/m  General:  Thin black male in no acute distress  Head: Normocephalic and atraumatic Eyes:  Sclerae anicteric, conjunctiva pink. Ears: Normal auditory acuity Lungs: Clear throughout to auscultation; no increased WOB. Heart: Regular rate and rhythm; no M/R/G. Abdomen: Soft, non-distended.  BS present.  Non-tender. Musculoskeletal: Symmetrical with no gross deformities  Skin: No lesions on visible extremities Extremities: No edema  Psychological:  Alert and cooperative.  Answers with single words if asked a question directly but does not engage in any conversation.  ASSESSMENT AND PLAN: *83 year old male with advanced dementia who was sent here for evaluation of weight loss.  They say that his weight has been up and down, but this is probably the lowest weight that he has been.  No GI complaints.  Non-contrast CT scan of the  abdomen and pelvis was ok.  Unsure why he didn't get contrast.  No GI evaluation in the past 10 years or so according to his family (they think that he may have had a colonoscopy in the remote past), but after they left it came to our attention that he may have had colonoscopy at [facility] 5 or 6 years ago so we are looking into that.  Will check CT scan of the abdomen and pelvis with contrast. *Anemia, normocytic:  Most recent Hgb 7.9 grams.   Anemia has been present at least over the past year with Hgb not over 10.4 grams since that time.  No overt sign of GI bleeding.  Likely represents anemia of chronic disease, but will check vitamin B12 levels, folate, and iron studies.  Will check stools for occult blood.  **I discussed in great detail regarding options for evaluation with his sister and his niece who are his caretakers.  I think that GI evaluation with EGD and colonoscopy would be very difficult for him and they agreed.  We will start be performing the above studies and further decisions can be made pending those results.   CC:  Iona Hansen, NP

## 2017-05-31 NOTE — Patient Instructions (Addendum)
Your physician has requested that you go to the basement for lab work before leaving today.  Follow the instructions on the Hemoccult cards and mail them back to [place] when you are finished or you may take them directly to the lab in the basement of the [facility] building. We will call you with the results.   You have been scheduled for a CT scan of the abdomen and pelvis at [facility] ([address]---this is in the same building as Press [facility]).   You are scheduled on 06-09-17 at 2 pm. You should arrive 15 minutes prior to your appointment time for registration. Please follow the written instructions below on the day of your exam:  WARNING: IF YOU ARE ALLERGIC TO IODINE/X-RAY DYE, PLEASE NOTIFY RADIOLOGY IMMEDIATELY AT [phone]! YOU WILL BE GIVEN A 13 HOUR PREMEDICATION PREP.  1) Do not eat anything after 10 am (4 hours prior to your test) 2) You have been given 2 bottles of oral contrast to drink. The solution may taste               better if refrigerated, but do NOT add ice or any other liquid to this solution. Shake             well before drinking.    Drink 1 bottle of contrast @ 12 pm (2 hours prior to your exam)  Drink 1 bottle of contrast @ 1 pm (1 hour prior to your exam)  You may take any medications as prescribed with a small amount of water except for the following: Metformin, Glucophage, Glucovance, Avandamet, Riomet, Fortamet, Actoplus Met, Janumet, Glumetza or Metaglip. The above medications must be held the day of the exam AND 48 hours after the exam.  The purpose of you drinking the oral contrast is to aid in the visualization of your intestinal tract. The contrast solution may cause some diarrhea. Before your exam is started, you will be given a small amount of fluid to drink. Depending on your individual set of symptoms, you may also receive an intravenous injection of x-ray contrast/dye. Plan on being at [facility] for 30 minutes or longer,  depending on the type of exam you are having performed.  This test typically takes 30-45 minutes to complete.  If you have any questions regarding your exam or if you need to reschedule, you may call the CT department at [phone] between the hours of 8:00 am and 5:00 pm, Monday-Friday.  ________________________________________________________________________

## 2017-06-08 NOTE — Progress Notes (Signed)
Reviewed and agree with initial management plan.  Eric Cameron T. Crystalynn Mcinerney, MD FACG 

## 2017-06-09 ENCOUNTER — Ambulatory Visit (INDEPENDENT_AMBULATORY_CARE_PROVIDER_SITE_OTHER)
Admission: RE | Admit: 2017-06-09 | Discharge: 2017-06-09 | Disposition: A | Discharge status: Home / Self Care | Payer: Medicare Other | Source: Ambulatory Visit | Attending: Gastroenterology | Admitting: Gastroenterology

## 2017-06-09 DIAGNOSIS — D649 Anemia, unspecified: Secondary | ICD-10-CM

## 2017-06-09 DIAGNOSIS — R634 Abnormal weight loss: Secondary | ICD-10-CM

## 2017-07-08 ENCOUNTER — Inpatient Hospital Stay (HOSPITAL_COMMUNITY)
Admission: EM | Admit: 2017-07-08 | Discharge: 2017-07-13 | DRG: 689 | Disposition: A | Discharge status: Hospice | Payer: Medicare Other | Attending: Internal Medicine | Admitting: Internal Medicine

## 2017-07-08 ENCOUNTER — Other Ambulatory Visit: Payer: Self-pay

## 2017-07-08 ENCOUNTER — Encounter (HOSPITAL_COMMUNITY): Payer: Self-pay | Admitting: Emergency Medicine

## 2017-07-08 DIAGNOSIS — E43 Unspecified severe protein-calorie malnutrition: Secondary | ICD-10-CM | POA: Diagnosis present

## 2017-07-08 DIAGNOSIS — J690 Pneumonitis due to inhalation of food and vomit: Secondary | ICD-10-CM | POA: Diagnosis not present

## 2017-07-08 DIAGNOSIS — L89112 Pressure ulcer of right upper back, stage 2: Secondary | ICD-10-CM | POA: Diagnosis present

## 2017-07-08 DIAGNOSIS — I10 Essential (primary) hypertension: Secondary | ICD-10-CM | POA: Diagnosis present

## 2017-07-08 DIAGNOSIS — Z7984 Long term (current) use of oral hypoglycemic drugs: Secondary | ICD-10-CM

## 2017-07-08 DIAGNOSIS — E1165 Type 2 diabetes mellitus with hyperglycemia: Secondary | ICD-10-CM | POA: Diagnosis present

## 2017-07-08 DIAGNOSIS — F0280 Dementia in other diseases classified elsewhere without behavioral disturbance: Secondary | ICD-10-CM | POA: Diagnosis present

## 2017-07-08 DIAGNOSIS — R627 Adult failure to thrive: Secondary | ICD-10-CM | POA: Diagnosis present

## 2017-07-08 DIAGNOSIS — E875 Hyperkalemia: Secondary | ICD-10-CM | POA: Diagnosis present

## 2017-07-08 DIAGNOSIS — G309 Alzheimer's disease, unspecified: Secondary | ICD-10-CM | POA: Diagnosis present

## 2017-07-08 DIAGNOSIS — F419 Anxiety disorder, unspecified: Secondary | ICD-10-CM | POA: Diagnosis present

## 2017-07-08 DIAGNOSIS — E785 Hyperlipidemia, unspecified: Secondary | ICD-10-CM | POA: Diagnosis present

## 2017-07-08 DIAGNOSIS — R29712 NIHSS score 12: Secondary | ICD-10-CM | POA: Diagnosis present

## 2017-07-08 DIAGNOSIS — E860 Dehydration: Secondary | ICD-10-CM

## 2017-07-08 DIAGNOSIS — T17908A Unspecified foreign body in respiratory tract, part unspecified causing other injury, initial encounter: Secondary | ICD-10-CM

## 2017-07-08 DIAGNOSIS — I110 Hypertensive heart disease with heart failure: Secondary | ICD-10-CM | POA: Diagnosis present

## 2017-07-08 DIAGNOSIS — Z681 Body mass index (BMI) 19 or less, adult: Secondary | ICD-10-CM

## 2017-07-08 DIAGNOSIS — E871 Hypo-osmolality and hyponatremia: Secondary | ICD-10-CM | POA: Diagnosis present

## 2017-07-08 DIAGNOSIS — R4702 Dysphasia: Secondary | ICD-10-CM | POA: Diagnosis present

## 2017-07-08 DIAGNOSIS — T501X5A Adverse effect of loop [high-ceiling] diuretics, initial encounter: Secondary | ICD-10-CM | POA: Diagnosis not present

## 2017-07-08 DIAGNOSIS — R531 Weakness: Secondary | ICD-10-CM | POA: Diagnosis present

## 2017-07-08 DIAGNOSIS — Z8673 Personal history of transient ischemic attack (TIA), and cerebral infarction without residual deficits: Secondary | ICD-10-CM

## 2017-07-08 DIAGNOSIS — Z7189 Other specified counseling: Secondary | ICD-10-CM

## 2017-07-08 DIAGNOSIS — Z7982 Long term (current) use of aspirin: Secondary | ICD-10-CM

## 2017-07-08 DIAGNOSIS — N400 Enlarged prostate without lower urinary tract symptoms: Secondary | ICD-10-CM | POA: Diagnosis present

## 2017-07-08 DIAGNOSIS — Z8669 Personal history of other diseases of the nervous system and sense organs: Secondary | ICD-10-CM

## 2017-07-08 DIAGNOSIS — N390 Urinary tract infection, site not specified: Secondary | ICD-10-CM | POA: Diagnosis not present

## 2017-07-08 DIAGNOSIS — Z79899 Other long term (current) drug therapy: Secondary | ICD-10-CM

## 2017-07-08 DIAGNOSIS — F329 Major depressive disorder, single episode, unspecified: Secondary | ICD-10-CM | POA: Diagnosis present

## 2017-07-08 DIAGNOSIS — I5032 Chronic diastolic (congestive) heart failure: Secondary | ICD-10-CM | POA: Diagnosis present

## 2017-07-08 DIAGNOSIS — G9341 Metabolic encephalopathy: Secondary | ICD-10-CM | POA: Diagnosis present

## 2017-07-08 DIAGNOSIS — IMO0002 Reserved for concepts with insufficient information to code with codable children: Secondary | ICD-10-CM | POA: Diagnosis present

## 2017-07-08 DIAGNOSIS — E861 Hypovolemia: Secondary | ICD-10-CM | POA: Diagnosis present

## 2017-07-08 DIAGNOSIS — N179 Acute kidney failure, unspecified: Secondary | ICD-10-CM | POA: Diagnosis present

## 2017-07-08 DIAGNOSIS — N3000 Acute cystitis without hematuria: Secondary | ICD-10-CM

## 2017-07-08 DIAGNOSIS — D649 Anemia, unspecified: Secondary | ICD-10-CM | POA: Diagnosis present

## 2017-07-08 DIAGNOSIS — H409 Unspecified glaucoma: Secondary | ICD-10-CM | POA: Diagnosis present

## 2017-07-08 DIAGNOSIS — Z87891 Personal history of nicotine dependence: Secondary | ICD-10-CM

## 2017-07-08 DIAGNOSIS — R1310 Dysphagia, unspecified: Secondary | ICD-10-CM | POA: Diagnosis present

## 2017-07-08 DIAGNOSIS — Z515 Encounter for palliative care: Secondary | ICD-10-CM

## 2017-07-08 DIAGNOSIS — I959 Hypotension, unspecified: Secondary | ICD-10-CM | POA: Diagnosis present

## 2017-07-08 DIAGNOSIS — Z66 Do not resuscitate: Secondary | ICD-10-CM | POA: Diagnosis present

## 2017-07-08 LAB — COMPREHENSIVE METABOLIC PANEL
ALT: 18 U/L (ref 17–63)
AST: 17 U/L (ref 15–41)
Albumin: 2.3 g/dL — ABNORMAL LOW (ref 3.5–5.0)
Alkaline Phosphatase: 63 U/L (ref 38–126)
Anion gap: 9 (ref 5–15)
BUN: 79 mg/dL — ABNORMAL HIGH (ref 6–20)
CO2: 14 mmol/L — AB (ref 22–32)
Calcium: 9.4 mg/dL (ref 8.9–10.3)
Chloride: 105 mmol/L (ref 101–111)
Creatinine, Ser: 1.42 mg/dL — ABNORMAL HIGH (ref 0.61–1.24)
GFR, EST AFRICAN AMERICAN: 52 mL/min — AB (ref >60)
GFR, EST NON AFRICAN AMERICAN: 45 mL/min — AB (ref >60)
Glucose, Bld: 158 mg/dL — ABNORMAL HIGH (ref 65–99)
POTASSIUM: 7.3 mmol/L — AB (ref 3.5–5.1)
SODIUM: 128 mmol/L — AB (ref 135–145)
Total Bilirubin: 0.3 mg/dL (ref 0.3–1.2)
Total Protein: 8.3 g/dL — ABNORMAL HIGH (ref 6.5–8.1)

## 2017-07-08 LAB — CBC
HEMATOCRIT: 26.1 % — AB (ref 39.0–52.0)
HEMOGLOBIN: 8.4 g/dL — AB (ref 13.0–17.0)
MCH: 28.1 pg (ref 26.0–34.0)
MCHC: 32.2 g/dL (ref 30.0–36.0)
MCV: 87.3 fL (ref 78.0–100.0)
Platelets: 577 10*3/uL — ABNORMAL HIGH (ref 150–400)
RBC: 2.99 MIL/uL — AB (ref 4.22–5.81)
RDW: 14.2 % (ref 11.5–15.5)
WBC: 17.7 10*3/uL — AB (ref 4.0–10.5)

## 2017-07-08 LAB — LIPASE, BLOOD: LIPASE: 78 U/L — AB (ref 11–51)

## 2017-07-08 MED ORDER — INSULIN ASPART 100 UNIT/ML ~~LOC~~ SOLN
10.0000 [IU] | Freq: Once | SUBCUTANEOUS | Status: AC
  Administered 2017-07-09: 10 [IU] via INTRAVENOUS
  Filled 2017-07-08: qty 1

## 2017-07-08 MED ORDER — SODIUM CHLORIDE 0.9 % IV SOLN
1.0000 g | Freq: Once | INTRAVENOUS | Status: AC
  Administered 2017-07-09: 1 g via INTRAVENOUS
  Filled 2017-07-08: qty 10

## 2017-07-08 MED ORDER — ONDANSETRON HCL 4 MG/2ML IJ SOLN
4.0000 mg | Freq: Once | INTRAMUSCULAR | Status: AC
  Administered 2017-07-09: 4 mg via INTRAVENOUS
  Filled 2017-07-08: qty 2

## 2017-07-08 MED ORDER — DEXTROSE 50 % IV SOLN
1.0000 | Freq: Once | INTRAVENOUS | Status: AC
  Administered 2017-07-09: 50 mL via INTRAVENOUS
  Filled 2017-07-08: qty 50

## 2017-07-08 MED ORDER — SODIUM BICARBONATE 8.4 % IV SOLN
50.0000 meq | Freq: Once | INTRAVENOUS | Status: AC
  Administered 2017-07-09: 50 meq via INTRAVENOUS
  Filled 2017-07-08: qty 50

## 2017-07-08 MED ORDER — SODIUM CHLORIDE 0.9 % IV BOLUS (SEPSIS)
2000.0000 mL | Freq: Once | INTRAVENOUS | Status: AC
  Administered 2017-07-09: 2000 mL via INTRAVENOUS

## 2017-07-08 NOTE — ED Triage Notes (Signed)
Sister (care giver) reports pt has lost his appetite since Wednesday, she switched to soft foods which helped but now he won't eat that . He has dementia, she believes he had abdominal pain.  They can tell he is becoming weaker.

## 2017-07-09 ENCOUNTER — Observation Stay (HOSPITAL_COMMUNITY): Payer: Medicare Other

## 2017-07-09 ENCOUNTER — Emergency Department (HOSPITAL_COMMUNITY): Payer: Medicare Other

## 2017-07-09 DIAGNOSIS — E860 Dehydration: Secondary | ICD-10-CM | POA: Diagnosis not present

## 2017-07-09 DIAGNOSIS — Z66 Do not resuscitate: Secondary | ICD-10-CM

## 2017-07-09 DIAGNOSIS — D649 Anemia, unspecified: Secondary | ICD-10-CM | POA: Diagnosis not present

## 2017-07-09 DIAGNOSIS — N3000 Acute cystitis without hematuria: Secondary | ICD-10-CM | POA: Diagnosis not present

## 2017-07-09 DIAGNOSIS — R627 Adult failure to thrive: Secondary | ICD-10-CM | POA: Diagnosis present

## 2017-07-09 DIAGNOSIS — E871 Hypo-osmolality and hyponatremia: Secondary | ICD-10-CM

## 2017-07-09 DIAGNOSIS — N179 Acute kidney failure, unspecified: Secondary | ICD-10-CM | POA: Diagnosis not present

## 2017-07-09 DIAGNOSIS — G309 Alzheimer's disease, unspecified: Secondary | ICD-10-CM

## 2017-07-09 DIAGNOSIS — F0280 Dementia in other diseases classified elsewhere without behavioral disturbance: Secondary | ICD-10-CM

## 2017-07-09 DIAGNOSIS — E43 Unspecified severe protein-calorie malnutrition: Secondary | ICD-10-CM | POA: Diagnosis not present

## 2017-07-09 DIAGNOSIS — E875 Hyperkalemia: Secondary | ICD-10-CM | POA: Diagnosis not present

## 2017-07-09 DIAGNOSIS — E785 Hyperlipidemia, unspecified: Secondary | ICD-10-CM | POA: Diagnosis not present

## 2017-07-09 DIAGNOSIS — Z8673 Personal history of transient ischemic attack (TIA), and cerebral infarction without residual deficits: Secondary | ICD-10-CM

## 2017-07-09 LAB — BASIC METABOLIC PANEL
Anion gap: 11 (ref 5–15)
Anion gap: 10 (ref 5–15)
BUN: 65 mg/dL — AB (ref 6–20)
BUN: 56 mg/dL — AB (ref 6–20)
CALCIUM: 10.1 mg/dL (ref 8.9–10.3)
CHLORIDE: 109 mmol/L (ref 101–111)
CO2: 16 mmol/L — ABNORMAL LOW (ref 22–32)
CO2: 17 mmol/L — ABNORMAL LOW (ref 22–32)
Calcium: 10.9 mg/dL — ABNORMAL HIGH (ref 8.9–10.3)
Chloride: 114 mmol/L — ABNORMAL HIGH (ref 101–111)
Creatinine, Ser: 1.16 mg/dL (ref 0.61–1.24)
Creatinine, Ser: 1.1 mg/dL (ref 0.61–1.24)
GFR calc Af Amer: >60 mL/min (ref >60)
GFR calc Af Amer: >60 mL/min (ref >60)
GFR calc non Af Amer: 58 mL/min — ABNORMAL LOW (ref >60)
GLUCOSE: 52 mg/dL — AB (ref 65–99)
Glucose, Bld: 139 mg/dL — ABNORMAL HIGH (ref 65–99)
POTASSIUM: 6.2 mmol/L — AB (ref 3.5–5.1)
POTASSIUM: 6 mmol/L — AB (ref 3.5–5.1)
SODIUM: 141 mmol/L (ref 135–145)
Sodium: 136 mmol/L (ref 135–145)

## 2017-07-09 LAB — CBC
HCT: 27.3 % — ABNORMAL LOW (ref 39.0–52.0)
HEMATOCRIT: 27.4 % — AB (ref 39.0–52.0)
Hemoglobin: 8.7 g/dL — ABNORMAL LOW (ref 13.0–17.0)
Hemoglobin: 8.7 g/dL — ABNORMAL LOW (ref 13.0–17.0)
MCH: 27.7 pg (ref 26.0–34.0)
MCH: 27.6 pg (ref 26.0–34.0)
MCHC: 31.8 g/dL (ref 30.0–36.0)
MCHC: 31.9 g/dL (ref 30.0–36.0)
MCV: 87.3 fL (ref 78.0–100.0)
MCV: 86.7 fL (ref 78.0–100.0)
PLATELETS: 577 10*3/uL — AB (ref 150–400)
Platelets: 697 10*3/uL — ABNORMAL HIGH (ref 150–400)
RBC: 3.14 MIL/uL — ABNORMAL LOW (ref 4.22–5.81)
RBC: 3.15 MIL/uL — ABNORMAL LOW (ref 4.22–5.81)
RDW: 14.3 % (ref 11.5–15.5)
RDW: 14.2 % (ref 11.5–15.5)
WBC: 26.6 10*3/uL — AB (ref 4.0–10.5)
WBC: 18.2 10*3/uL — AB (ref 4.0–10.5)

## 2017-07-09 LAB — URINALYSIS, ROUTINE W REFLEX MICROSCOPIC
BACTERIA UA: NONE SEEN
BILIRUBIN URINE: NEGATIVE
Glucose, UA: 50 mg/dL — AB
KETONES UR: NEGATIVE mg/dL
Nitrite: NEGATIVE
PROTEIN: 100 mg/dL — AB
SQUAMOUS EPITHELIAL / LPF: NONE SEEN
Specific Gravity, Urine: 1.01 (ref 1.005–1.030)
pH: 5 (ref 5.0–8.0)

## 2017-07-09 LAB — CBG MONITORING, ED
GLUCOSE-CAPILLARY: 111 mg/dL — AB (ref 65–99)
GLUCOSE-CAPILLARY: 197 mg/dL — AB (ref 65–99)
Glucose-Capillary: 123 mg/dL — ABNORMAL HIGH (ref 65–99)
Glucose-Capillary: 140 mg/dL — ABNORMAL HIGH (ref 65–99)

## 2017-07-09 LAB — GLUCOSE, CAPILLARY
Glucose-Capillary: 102 mg/dL — ABNORMAL HIGH (ref 65–99)
Glucose-Capillary: 111 mg/dL — ABNORMAL HIGH (ref 65–99)

## 2017-07-09 LAB — POTASSIUM: POTASSIUM: 4.5 mmol/L (ref 3.5–5.1)

## 2017-07-09 LAB — LACTIC ACID, PLASMA: Lactic Acid, Venous: 1 mmol/L (ref 0.5–1.9)

## 2017-07-09 LAB — TSH: TSH: 0.41 u[IU]/mL (ref 0.350–4.500)

## 2017-07-09 MED ORDER — ACETAMINOPHEN 650 MG RE SUPP: 650.0000 mg | Freq: Four times a day (QID) | RECTAL | Status: DC | PRN

## 2017-07-09 MED ORDER — TAMSULOSIN HCL 0.4 MG PO CAPS
0.4000 mg | ORAL_CAPSULE | Freq: Every day | ORAL | Status: DC
  Administered 2017-07-09 – 2017-07-12 (×3): 0.4 mg via ORAL
  Filled 2017-07-09 (×3): qty 1

## 2017-07-09 MED ORDER — LORAZEPAM 2 MG/ML IJ SOLN
INTRAMUSCULAR | Status: AC
  Administered 2017-07-09: 1 mg
  Filled 2017-07-09: qty 1

## 2017-07-09 MED ORDER — SODIUM CHLORIDE 0.9 % IV SOLN
1.0000 g | Freq: Once | INTRAVENOUS | Status: AC
  Administered 2017-07-09: 1 g via INTRAVENOUS
  Filled 2017-07-09: qty 10

## 2017-07-09 MED ORDER — ASPIRIN 325 MG PO TABS
325.0000 mg | ORAL_TABLET | Freq: Every day | ORAL | Status: DC
  Administered 2017-07-09 – 2017-07-13 (×4): 325 mg via ORAL
  Filled 2017-07-09 (×4): qty 1

## 2017-07-09 MED ORDER — ONDANSETRON HCL 4 MG/2ML IJ SOLN: 4.0000 mg | Freq: Four times a day (QID) | INTRAMUSCULAR | Status: DC | PRN

## 2017-07-09 MED ORDER — HYDRALAZINE HCL 20 MG/ML IJ SOLN
5.0000 mg | INTRAMUSCULAR | Status: DC | PRN
Start: 2017-07-09 — End: 2017-07-11

## 2017-07-09 MED ORDER — SODIUM POLYSTYRENE SULFONATE 15 GM/60ML PO SUSP
45.0000 g | Freq: Once | ORAL | Status: AC
  Administered 2017-07-09: 45 g via ORAL
  Filled 2017-07-09: qty 180

## 2017-07-09 MED ORDER — ENSURE ENLIVE PO LIQD
Freq: Two times a day (BID) | ORAL | Status: DC
  Administered 2017-07-09: 237 mL via ORAL
  Administered 2017-07-09: 11:00:00 via ORAL
  Administered 2017-07-10: 237 mL via ORAL
  Administered 2017-07-11: 10:00:00 via ORAL
  Administered 2017-07-11: 237 mL via ORAL
  Administered 2017-07-12: 11:00:00 via ORAL
  Administered 2017-07-12 – 2017-07-13 (×2): 237 mL via ORAL
  Filled 2017-07-09 (×2): qty 237

## 2017-07-09 MED ORDER — FUROSEMIDE 10 MG/ML IJ SOLN
40.0000 mg | Freq: Once | INTRAMUSCULAR | Status: AC
  Administered 2017-07-09: 40 mg via INTRAVENOUS
  Filled 2017-07-09: qty 4

## 2017-07-09 MED ORDER — VITAMIN B-1 100 MG PO TABS
100.0000 mg | ORAL_TABLET | Freq: Every day | ORAL | Status: DC
  Administered 2017-07-09 – 2017-07-13 (×4): 100 mg via ORAL
  Filled 2017-07-09 (×4): qty 1

## 2017-07-09 MED ORDER — VITAMIN B-12 100 MCG PO TABS
100.0000 ug | ORAL_TABLET | Freq: Every day | ORAL | Status: DC
  Administered 2017-07-11 – 2017-07-13 (×3): 100 ug via ORAL
  Filled 2017-07-09 (×5): qty 1

## 2017-07-09 MED ORDER — FINASTERIDE 5 MG PO TABS
5.0000 mg | ORAL_TABLET | Freq: Every day | ORAL | Status: DC
  Administered 2017-07-11 – 2017-07-13 (×3): 5 mg via ORAL
  Filled 2017-07-09 (×4): qty 1

## 2017-07-09 MED ORDER — SODIUM CHLORIDE 0.9 % IV SOLN
INTRAVENOUS | Status: AC
  Administered 2017-07-09: 14:00:00 via INTRAVENOUS

## 2017-07-09 MED ORDER — CITALOPRAM HYDROBROMIDE 10 MG PO TABS
10.0000 mg | ORAL_TABLET | Freq: Every day | ORAL | Status: DC
  Administered 2017-07-09 – 2017-07-12 (×4): 10 mg via ORAL
  Filled 2017-07-09 (×4): qty 1

## 2017-07-09 MED ORDER — MEMANTINE HCL 10 MG PO TABS
10.0000 mg | ORAL_TABLET | Freq: Two times a day (BID) | ORAL | Status: DC
  Administered 2017-07-09 – 2017-07-13 (×8): 10 mg via ORAL
  Filled 2017-07-09 (×9): qty 1

## 2017-07-09 MED ORDER — ZOLPIDEM TARTRATE 5 MG PO TABS: 5.0000 mg | ORAL_TABLET | Freq: Every evening | ORAL | Status: DC | PRN

## 2017-07-09 MED ORDER — SODIUM CHLORIDE 0.9 % IV SOLN
1.0000 g | INTRAVENOUS | Status: DC
  Administered 2017-07-10 – 2017-07-12 (×3): 1 g via INTRAVENOUS
  Filled 2017-07-09 (×3): qty 10

## 2017-07-09 MED ORDER — SODIUM CHLORIDE 0.9 % IV BOLUS (SEPSIS)
1000.0000 mL | Freq: Once | INTRAVENOUS | Status: AC
  Administered 2017-07-09: 1000 mL via INTRAVENOUS

## 2017-07-09 MED ORDER — FOLIC ACID 1 MG PO TABS
1.0000 mg | ORAL_TABLET | Freq: Every day | ORAL | Status: DC
  Administered 2017-07-09 – 2017-07-13 (×4): 1 mg via ORAL
  Filled 2017-07-09 (×4): qty 1

## 2017-07-09 MED ORDER — ACETAMINOPHEN 325 MG PO TABS
650.0000 mg | ORAL_TABLET | Freq: Four times a day (QID) | ORAL | Status: DC | PRN
  Administered 2017-07-12 (×2): 650 mg via ORAL
  Filled 2017-07-09 (×2): qty 2

## 2017-07-09 MED ORDER — ONDANSETRON HCL 4 MG PO TABS: 4.0000 mg | ORAL_TABLET | Freq: Four times a day (QID) | ORAL | Status: DC | PRN

## 2017-07-09 MED ORDER — ATORVASTATIN CALCIUM 20 MG PO TABS
20.0000 mg | ORAL_TABLET | Freq: Every day | ORAL | Status: DC
  Administered 2017-07-11 – 2017-07-13 (×3): 20 mg via ORAL
  Filled 2017-07-09 (×4): qty 1

## 2017-07-09 MED ORDER — SODIUM CHLORIDE 0.9 % IV BOLUS (SEPSIS)
500.0000 mL | Freq: Once | INTRAVENOUS | Status: AC
  Administered 2017-07-09: 500 mL via INTRAVENOUS

## 2017-07-09 MED ORDER — ENOXAPARIN SODIUM 40 MG/0.4ML ~~LOC~~ SOLN
40.0000 mg | SUBCUTANEOUS | Status: DC
  Administered 2017-07-09 – 2017-07-12 (×3): 40 mg via SUBCUTANEOUS
  Filled 2017-07-09 (×3): qty 0.4

## 2017-07-09 MED ORDER — INSULIN ASPART 100 UNIT/ML ~~LOC~~ SOLN
0.0000 [IU] | Freq: Three times a day (TID) | SUBCUTANEOUS | Status: DC
  Administered 2017-07-09: 2 [IU] via SUBCUTANEOUS
  Administered 2017-07-11: 1 [IU] via SUBCUTANEOUS
  Administered 2017-07-11: 5 [IU] via SUBCUTANEOUS
  Administered 2017-07-11: 1 [IU] via SUBCUTANEOUS
  Administered 2017-07-12 (×3): 2 [IU] via SUBCUTANEOUS
  Administered 2017-07-13: 1 [IU] via SUBCUTANEOUS
  Administered 2017-07-13: 3 [IU] via SUBCUTANEOUS
  Filled 2017-07-09: qty 1

## 2017-07-09 MED ORDER — POLYETHYLENE GLYCOL 3350 17 G PO PACK: 17.0000 g | PACK | Freq: Every day | ORAL | Status: DC | PRN

## 2017-07-09 MED ORDER — INSULIN ASPART 100 UNIT/ML ~~LOC~~ SOLN
0.0000 [IU] | Freq: Every day | SUBCUTANEOUS | Status: DC
  Administered 2017-07-12: 2 [IU] via SUBCUTANEOUS

## 2017-07-09 NOTE — ED Notes (Signed)
CBG 197. 

## 2017-07-09 NOTE — Progress Notes (Signed)
Pt presents with pressure wounds to the right shoulder, right hip and left hip. Left hip skin is intact, but discolored.   Foams applied to right shoulder/hip as pt continues to land on his right side in fetal position.   Pt has a small pink abrasion on the right scapula. Left open to air.

## 2017-07-09 NOTE — Progress Notes (Signed)
Dr Caleb PoppNettey at bedside for BP recheck (98/68 manual checked by Dr Caleb PoppNettey) following second (first on floor) 1 liter bolus. Order received for additional 500ml bolus, then continue the 7575ml/hr infusion following.  Per Dr Caleb PoppNettey, family unavailable at this time; if pt does not improve he might discuss possibility of palliative. Due to this, IV diureses (for hyperkalemia, treated by kaexalate and lasix- requiring rectal tube as well) and unknown level of impulsiveness due to current ativan dose on board there has been a foley inserted (by ED) and remaining until pt can be reassessed by MD in the morning, and/or palliative possibility discussed with family.  Pt will not remain flat on his back for an EKG to be performed, per last order. Dr Caleb PoppNettey aware, agreeable. Dr Caleb PoppNettey also agrees that pt is likely not going to take any medications from us at this time as he refuses to uncover his head with the blanket.   Pts IV wrapped with kerlex. Pt's family was initially present when pt arrived, however the disappeared from outside the door before pt could be cleaned/situated in the bed.

## 2017-07-09 NOTE — ED Notes (Signed)
Report attempted again no one available to take report

## 2017-07-09 NOTE — ED Notes (Signed)
Report attempted 

## 2017-07-09 NOTE — ED Provider Notes (Signed)
[facility] EMERGENCY DEPARTMENT Provider Note   CSN: [id number] Arrival date & time: 07/08/17  2045     History   Chief Complaint Chief Complaint  Patient presents with  . Abdominal Pain  . Weakness    Level V Caveat: Dementia  HPI Eric Cameron is a 83 y.o. male.  HPI Patient is an 83 year old male presents to the emergency department with decreased oral intake over the past several days and increasing generalized weakness.  Patient with advanced dementia.  He requires total care by his sister.  She can tell that he is becoming more weak.  Today he did not get out of bed.  He has had no vomiting.  She thinks his abdomen may be hurting him.  No other information is available.  Patient is a DO NOT RESUSCITATE.   He is a DO NOT INTUBATE.   Past Medical History:  Diagnosis Date  . Acute encephalopathy   . Alzheimer disease   . Anxiety   . Dehydration   . Dementia   . Depression   . Diabetes mellitus without complication (HCC)   . Elevated troponin   . Glaucoma   . HLD (hyperlipidemia)   . Hypertension   . PNA (pneumonia)   . Pressure ulcer   . Seizures (HCC)   . Stroke (cerebrum) [facility])     Patient Active Problem List   Diagnosis Date Noted  . Normocytic anemia 05/31/2017  . Abnormal weight loss 05/31/2017  . Hypoglycemia   . Severe protein-calorie malnutrition (HCC)   . Pressure injury of skin 02/05/2017  . Palliative care encounter   . Goals of care, counseling/discussion   . Protein-calorie malnutrition, severe 02/04/2017  . Severe sepsis (HCC) 02/01/2017  . DKA (diabetic ketoacidosis) (HCC) 02/01/2017  . Acute renal failure (ARF) (HCC) 06/08/2016  . Elevated lactic acid level 06/08/2016  . Sepsis (HCC) 05/28/2016  . UTI (urinary tract infection) 05/28/2016  . Alzheimer's dementia without behavioral disturbance 04/10/2016  . Fungemia 07/18/2015  . Leukocytosis 07/18/2015  . Pressure ulcer of hip 07/06/2015  . PNA (pneumonia)  07/06/2015  . Elevated troponin 07/01/2015  . Dehydration 07/01/2015  . Seizures (HCC) 12/03/2013  . H/O: CVA (cerebrovascular accident) 12/01/2013  . Altered mental status 11/30/2013  . Acute encephalopathy 11/30/2013  . HTN (hypertension) 11/30/2013  . Diabetes mellitus type 2, uncontrolled (HCC) 11/30/2013  . HLD (hyperlipidemia) 11/30/2013    Past Surgical History:  Procedure Laterality Date  . NO PAST SURGERIES          Home Medications    Prior to Admission medications   Medication Sig Start Date End Date Taking? Authorizing Provider  Amino Acids (HIGH PROTEIN PO) Take 30 mLs by mouth 2 (two) times daily.     [provider]  aspirin 325 MG tablet Take 1 tablet (325 mg total) by mouth daily. 12/03/13   Mikhail, Nita Sells, DO  atorvastatin (LIPITOR) 20 MG tablet Take 20 mg by mouth daily.    [provider]  citalopram (CELEXA) 10 MG tablet Take 1 tablet by mouth at bedtime. 05/18/15   [provider]  finasteride (PROSCAR) 5 MG tablet Take 1 tablet by mouth daily. 05/18/17   [provider]  folic acid (FOLVITE) 1 MG tablet Take 1 tablet (1 mg total) by mouth daily. 07/10/15   Meredeth Ide, MD  KLOR-CON M10 10 MEQ tablet Take 20 mEq by mouth daily. 05/17/17   [provider]  lisinopril (PRINIVIL,ZESTRIL) 2.5 MG tablet Take  2.5 mg by mouth daily.    [provider]  memantine (NAMENDA) 10 MG tablet Take 10 mg by mouth 2 (two) times daily.     [provider]  metFORMIN (GLUCOPHAGE-XR) 500 MG 24 hr tablet Take 1,000 mg by mouth daily. 01/31/17   [provider]  Nutritional Supplements (NUTRITIONAL SUPPLEMENT PLUS) LIQD Take 1 Bottle by mouth 2 (two) times daily. Med Pass 120 mL    [provider]  tamsulosin (FLOMAX) 0.4 MG CAPS capsule Take 0.4 mg by mouth daily. 05/18/17   [provider]  thiamine 100 MG tablet Take 1 tablet (100 mg total) by mouth daily. 07/10/15   Meredeth Ide, MD    vitamin B-12 (CYANOCOBALAMIN) 100 MCG tablet Take 100 mcg by mouth daily.    [provider]    Family History Family History  Problem Relation Age of Onset  . Diabetes Mother   . Cancer Father        colon or prostate  . Diabetes Mellitus II Brother   . Diabetes Sister     Social History Social History   Tobacco Use  . Smoking status: Former Smoker    Last attempt to quit: 07/07/2010    Years since quitting: 7.0  . Smokeless tobacco: Never Used  Substance Use Topics  . Alcohol use: No  . Drug use: No     Allergies   Patient has no known allergies.   Review of Systems Review of Systems  Unable to perform ROS: Dementia     Physical Exam Updated Vital Signs BP (!) 104/56   Pulse (!) 31   Temp 97.7 F (36.5 C) (Oral)   Resp 16   SpO2 (!) 71%    Physical Exam  Constitutional:  Chronically ill-appearing.  Cachectic  HENT:  Head: Normocephalic and atraumatic.  Eyes: EOM are normal.  Neck: Normal range of motion.  Cardiovascular: Normal rate and regular rhythm.  Pulmonary/Chest: Effort normal and breath sounds normal. No respiratory distress.  Abdominal: Soft. He exhibits no distension. There is no tenderness.  Musculoskeletal: Normal range of motion.  Neurological: He is alert.  Follow simple commands  Skin: Skin is warm and dry. No rash noted.  Psychiatric: He has a normal mood and affect.  Nursing note and vitals reviewed.    ED Treatments / Results  Labs (all labs ordered are listed, but only abnormal results are displayed) Labs Reviewed  LIPASE, BLOOD - Abnormal; Notable for the following components:      Result Value   Lipase 78 (*)    All other components within normal limits  COMPREHENSIVE METABOLIC PANEL - Abnormal; Notable for the following components:   Sodium 128 (*)    Potassium 7.3 (*)    CO2 14 (*)    Glucose, Bld 158 (*)    BUN 79 (*)    Creatinine, Ser 1.42 (*)    Total Protein 8.3 (*)    Albumin 2.3 (*)    GFR  calc non Af Amer 45 (*)    GFR calc Af Amer 52 (*)    All other components within normal limits  CBC - Abnormal; Notable for the following components:   WBC 17.7 (*)    RBC 2.99 (*)    Hemoglobin 8.4 (*)    HCT 26.1 (*)    Platelets 577 (*)    All other components within normal limits  URINALYSIS, ROUTINE W REFLEX MICROSCOPIC - Abnormal; Notable for the following components:  APPearance TURBID (*)    Glucose, UA 50 (*)    Hgb urine dipstick LARGE (*)    Protein, ur 100 (*)    Leukocytes, UA LARGE (*)    Non Squamous Epithelial 6-30 (*)    All other components within normal limits  CBG MONITORING, ED - Abnormal; Notable for the following components:   Glucose-Capillary 140 (*)    All other components within normal limits  CBG MONITORING, ED - Abnormal; Notable for the following components:   Glucose-Capillary 123 (*)    All other components within normal limits    EKG ECG interpretation   Date: 07/08/2017 2359  Rate: 80  Rhythm: normal sinus rhythm  QRS Axis: normal  Intervals: normal  ST/T Wave abnormalities: nonspecific T wave changes Conduction Disutrbances: none  Narrative Interpretation:   Old EKG Reviewed: no significant changes from prior EKG    Radiology Dg Chest Portable 1 View  Result Date: 07/09/2017 CLINICAL DATA:  Weakness. EXAM: PORTABLE CHEST 1 VIEW COMPARISON:  Radiograph 02/01/2017 FINDINGS: The cardiomediastinal contours are unchanged with unchanged aortic tortuosity. Multiple skin folds project over the left chest wall. Pulmonary vasculature is normal. No consolidation, pleural effusion, or pneumothorax. No acute osseous abnormalities are seen. IMPRESSION: No active disease. Electronically Signed   By: Rubye Oaks M.D.   On: 07/09/2017 02:39    Procedures .Critical Care Performed by: Azalia Bilis, MD Authorized by: Azalia Bilis, MD    CRITICAL CARE Performed by: Azalia Bilis Total critical care time: 35 minutes Critical care time was  exclusive of separately billable procedures and treating other patients. Critical care was necessary to treat or prevent imminent or life-threatening deterioration. Critical care was time spent personally by me on the following activities: development of treatment plan with patient and/or surrogate as well as nursing, discussions with consultants, evaluation of patient's response to treatment, examination of patient, obtaining history from patient or surrogate, ordering and performing treatments and interventions, ordering and review of laboratory studies, ordering and review of radiographic studies, pulse oximetry and re-evaluation of patient's condition.   Medications Ordered in ED Medications  cefTRIAXone (ROCEPHIN) 1 g in sodium chloride 0.9 % 100 mL IVPB (has no administration in time range)  calcium chloride 1 g in sodium chloride 0.9 % 100 mL IVPB (0 g Intravenous Stopped 07/09/17 0149)  insulin aspart (novoLOG) injection 10 Units (10 Units Intravenous Given 07/09/17 0129)  dextrose 50 % solution 50 mL (50 mLs Intravenous Given 07/09/17 0130)  sodium bicarbonate injection 50 mEq (50 mEq Intravenous Given 07/09/17 0130)  sodium chloride 0.9 % bolus 2,000 mL (2,000 mLs Intravenous New Bag/Given 07/09/17 0128)  ondansetron (ZOFRAN) injection 4 mg (4 mg Intravenous Given 07/09/17 0128)     Initial Impression / Assessment and Plan / ED Course  I have reviewed the triage vital signs and the nursing notes.  Pertinent labs & imaging results that were available during my care of the patient were reviewed by me and considered in my medical decision making (see chart for details).     Acute kidney injury with hyperkalemia.  No significant peak T waves on arrival.  Placed on cardiac monitor.  Patient will be receiving bicarb, calcium, dextrose, insulin.  Patient with acute urinary retention.  Catheter placed.  1 L of pyuria returned.  Likely acute renal failure from a combination of dehydration and post  renal obstruction.  Obstruction resolved with catheter placement.  Rocephin given for UTI.  Urine culture sent.  Long discussion had with family.  DNR status clarified.  DO NOT INTUBATE.  Family states the patient would not want to be initiated on dialysis.  IV fluids and hydration now.  Hyperkalemia treated.  Kayexalate to be given by hospitalist.  Admit to the stepdown unit.  Potassium 7.4.  Acute kidney injury noted.  I personally reviewed the patient's chest x-ray which shows no acute cardiopulmonary abnormalities  Final Clinical Impressions(s) / ED Diagnoses   Final diagnoses:  None    ED Discharge Orders    None       Gorden Stthomas, KevinAzalia Bilis, MD 07/09/17 [phone]

## 2017-07-09 NOTE — Progress Notes (Signed)
Per NT, they are unable to position patient to achieve EKG as he will not maintain a suitable position.

## 2017-07-09 NOTE — H&P (Signed)
History and Physical    Eric Cameron ZOX:[id number] DOB: 04/11/1935 DOA: 07/08/2017  Referring MD/NP/PA:   PCP: Iona Hansen, NP   Patient coming from:  The patient is coming from home.  At baseline, pt is independent for most of ADL. SNF  Assistant living facility   [facility]irement center.     Chief Complaint: failure to thrive, decreased oral intake, generalized weakness  HPI: Eric Cameron is a 83 y.o. male with medical history significant of dementia, hypertension, hyperlipidemia, diabetes mellitus, stroke, depression, anxiety, BPH, seizure, who presents with failure to thrive, decreased oral intake, generalized weakness, dysuria.  Patient with advanced dementia. He is a poor historian and is unable to provide accurate medical history, therefore, most of the history is obtained by discussing the case with ED physician and with the nursing staff. EDP was able to talk to his sister earlier. Pt has advanced dementia. He requires total care by his sister. In the past several days, patient has decreased oral intake, and generalized weakness. Her sister switched to soft foods which helped,  but now he won't eat that any more. When saw pt in ED, he knows his own name, and oriented to place, but not to time. He said he is hurting when urinating. Denies chest pain, cough, nausea, vomiting, diarrhea. He said he had abdominal pain earlier, but now now. He moves all extremities.  ED Course: pt was found to have positive urinalysis with large amount of leukocytes, WBC 17.7, lipase is 78, sodium 128, potassium 7.3, acute renal injury with creatinine 1.42, BUN 79, temperature normal, oxygen saturation 71 and then 100% on RA. Chest x-rays negative. Patient is placed on telemetry bed for observation.  Review of Systems: could not be reviewed accurately due to dementia.  Allergy: No Known Allergies  Past Medical History:  Diagnosis Date  . Acute encephalopathy   . Alzheimer disease   . Anxiety   .  Dehydration   . Dementia   . Depression   . Diabetes mellitus without complication (HCC)   . Elevated troponin   . Glaucoma   . HLD (hyperlipidemia)   . Hypertension   . PNA (pneumonia)   . Pressure ulcer   . Seizures (HCC)   . Stroke (cerebrum) [facility])     Past Surgical History:  Procedure Laterality Date  . NO PAST SURGERIES      Social History:  reports that he quit smoking about 7 years ago. He has never used smokeless tobacco. He reports that he does not drink alcohol or use drugs.  Family History:  Family History  Problem Relation Age of Onset  . Diabetes Mother   . Cancer Father        colon or prostate  . Diabetes Mellitus II Brother   . Diabetes Sister      Prior to Admission medications   Medication Sig Start Date End Date Taking? Authorizing Provider  Amino Acids (HIGH PROTEIN PO) Take 30 mLs by mouth 2 (two) times daily.     [provider]  aspirin 325 MG tablet Take 1 tablet (325 mg total) by mouth daily. 12/03/13   Mikhail, Nita Sells, DO  atorvastatin (LIPITOR) 20 MG tablet Take 20 mg by mouth daily.    [provider]  citalopram (CELEXA) 10 MG tablet Take 1 tablet by mouth at bedtime. 05/18/15   [provider]  finasteride (PROSCAR) 5 MG tablet Take 1 tablet by mouth daily. 05/18/17   [provider]  folic acid (FOLVITE)  1 MG tablet Take 1 tablet (1 mg total) by mouth daily. 07/10/15   Meredeth Ide, MD  KLOR-CON M10 10 MEQ tablet Take 20 mEq by mouth daily. 05/17/17   [provider]  lisinopril (PRINIVIL,ZESTRIL) 2.5 MG tablet Take 2.5 mg by mouth daily.    [provider]  memantine (NAMENDA) 10 MG tablet Take 10 mg by mouth 2 (two) times daily.     [provider]  metFORMIN (GLUCOPHAGE-XR) 500 MG 24 hr tablet Take 1,000 mg by mouth daily. 01/31/17   [provider]  Nutritional Supplements (NUTRITIONAL SUPPLEMENT PLUS) LIQD Take 1 Bottle by mouth 2 (two) times daily. Med Pass 120 mL     [provider]  tamsulosin (FLOMAX) 0.4 MG CAPS capsule Take 0.4 mg by mouth daily. 05/18/17   [provider]  thiamine 100 MG tablet Take 1 tablet (100 mg total) by mouth daily. 07/10/15   Meredeth Ide, MD  vitamin B-12 (CYANOCOBALAMIN) 100 MCG tablet Take 100 mcg by mouth daily.    [provider]    Physical Exam: Vitals:   07/08/17 2111 07/08/17 2330 07/09/17 0115 07/09/17 0315  BP: 97/68 106/62 (!) 104/56 (!) 109/53  Pulse: 77 82 (!) 31 84  Resp: 16 14 16 12   Temp: 97.7 F (36.5 C)     TempSrc: Oral     SpO2: 96% 100% (!) 71% 100%   General: Not in acute distress. Cachectic looking. Dry mucus and membrane HEENT:       Eyes: PERRL, EOMI, no scleral icterus.       ENT: No discharge from the ears and nose, no pharynx injection, no tonsillar enlargement.        Neck: No JVD, no bruit, no mass felt. Heme: No neck lymph node enlargement. Cardiac: S1/S2, RRR, No murmurs, No gallops or rubs. Respiratory: No rales, wheezing, rhonchi or rubs. GI: Soft, nondistended, nontender, no rebound pain, no organomegaly, BS present. GU: No hematuria Ext: No pitting leg edema bilaterally. 2+DP/PT pulse bilaterally. Musculoskeletal: No joint deformities, No joint redness or warmth, no limitation of ROM in spin. Skin: No rashes.  Neuro: knows his own name and oriented to place, but not to time. Cranial nerves II-XII grossly intact, moves all extremities. Psych: Patient is not psychotic, no suicidal or hemocidal ideation.  Labs on Admission: I have personally reviewed following labs and imaging studies  CBC: Recent Labs  Lab 07/08/17 2124  WBC 17.7*  HGB 8.4*  HCT 26.1*  MCV 87.3  PLT 577*   Basic Metabolic Panel: Recent Labs  Lab 07/08/17 2124  NA 128*  K 7.3*  CL 105  CO2 14*  GLUCOSE 158*  BUN 79*  CREATININE 1.42*  CALCIUM 9.4   GFR: CrCl cannot be calculated (Unknown ideal weight.). Liver Function Tests: Recent Labs  Lab 07/08/17 2124  AST  17  ALT 18  ALKPHOS 63  BILITOT 0.3  PROT 8.3*  ALBUMIN 2.3*   Recent Labs  Lab 07/08/17 2124  LIPASE 78*   No results for input(s): AMMONIA in the last 168 hours. Coagulation Profile: No results for input(s): INR, PROTIME in the last 168 hours. Cardiac Enzymes: No results for input(s): CKTOTAL, CKMB, CKMBINDEX, TROPONINI in the last 168 hours. BNP (last 3 results) No results for input(s): PROBNP in the last 8760 hours. HbA1C: No results for input(s): HGBA1C in the last 72 hours. CBG: Recent Labs  Lab 07/08/17 2352 07/09/17 0122  GLUCAP 140* 123*   Lipid Profile:  No results for input(s): CHOL, HDL, LDLCALC, TRIG, CHOLHDL, LDLDIRECT in the last 72 hours. Thyroid Function Tests: No results for input(s): TSH, T4TOTAL, FREET4, T3FREE, THYROIDAB in the last 72 hours. Anemia Panel: No results for input(s): VITAMINB12, FOLATE, FERRITIN, TIBC, IRON, RETICCTPCT in the last 72 hours. Urine analysis:    Component Value Date/Time   COLORURINE YELLOW 07/08/2017 0146   APPEARANCEUR TURBID (A) 07/08/2017 0146   LABSPEC 1.010 07/08/2017 0146   PHURINE 5.0 07/08/2017 0146   GLUCOSEU 50 (A) 07/08/2017 0146   HGBUR LARGE (A) 07/08/2017 0146   BILIRUBINUR NEGATIVE 07/08/2017 0146   KETONESUR NEGATIVE 07/08/2017 0146   PROTEINUR 100 (A) 07/08/2017 0146   UROBILINOGEN 1.0 11/30/2013 1734   NITRITE NEGATIVE 07/08/2017 0146   LEUKOCYTESUR LARGE (A) 07/08/2017 0146   Sepsis Labs: @LABRCNTIP (procalcitonin:4,lacticidven:4) )No results found for this or any previous visit (from the past 240 hour(s)).   Radiological Exams on Admission: Dg Chest Portable 1 View  Result Date: 07/09/2017 CLINICAL DATA:  Weakness. EXAM: PORTABLE CHEST 1 VIEW COMPARISON:  Radiograph 02/01/2017 FINDINGS: The cardiomediastinal contours are unchanged with unchanged aortic tortuosity. Multiple skin folds project over the left chest wall. Pulmonary vasculature is normal. No consolidation, pleural effusion, or  pneumothorax. No acute osseous abnormalities are seen. IMPRESSION: No active disease. Electronically Signed   By: Rubye Oaks M.D.   On: 07/09/2017 02:39     EKG:  Not done in ED, will get one.   Assessment/Plan Principal Problem:   UTI (urinary tract infection) Active Problems:   HTN (hypertension)   Diabetes mellitus type 2, uncontrolled (HCC)   HLD (hyperlipidemia)   H/O: CVA (cerebrovascular accident)   Alzheimer's dementia without behavioral disturbance   Acute renal failure (ARF) (HCC)   Severe protein-calorie malnutrition (HCC)   Normocytic anemia   Failure to thrive in adult   Hyperkalemia   Hyponatremia   UTI (urinary tract infection): -  Place on tele bed for obs -  Ceftriaxone by IV - Follow up results of urine and blood cx and amend antibiotic regimen if needed per sensitivity results - prn Zofran for nausea  Hyponatremia: Most likely due poor oral intake and dehydration - check TSH - IVF: 2L NS was given in  ED, will hold off IVF to avoid over quick correction.  - check BMP in AM.  Hyperkalemia: Potassium 7.3 -treated with D50, 10unit of Novolog by IV, 1g of calcium chloride, 50 mEq of sodium bicarbonate -will give 45 g of Kayexalate -get EKG -IVF as above -hold lisinopril and KLOR  AKI: Likely due to UTI and prerenal secondary to dehydration and continuation of ACEI - IVF as above - Check FeNa  - Follow up renal function by BMP - Hold lisinopril  HTN: bp is soft -hold lisinopril due to AKI -IV hydralazine when necessary  Diabetes mellitus type 2, uncontrolled (HCC): Last A1c 7.9 on 02/02/17. Patient is taking metformin -SSI  HLD: -lipitor  H/O: CVA (cerebrovascular accident): -ASA and lipitor  Alzheimer's dementia without behavioral disturbance: -Namenda  Severe protein-calorie malnutrition and Failure to thrive in adult: -will consult nutrition  Normocytic anemia: Hgb 8.4<--9.7 on 05/31/17, slight drop. -f/u by CBC    DVT ppx:  SQ Lovenox Code Status: Full code Family Communication: None at bed side.       Disposition Plan:  Anticipate discharge back to previous home environment Consults called:  none Admission status: Obs / tele   Date of Service 07/09/2017    Lorretta Harp [facility] Pager [phone]  If 7PM-7AM, please contact night-coverage [email or web address] Password [email or web address] 07/09/2017, 3:47 AM

## 2017-07-09 NOTE — ED Notes (Signed)
Patient transported to X-ray 

## 2017-07-09 NOTE — ED Notes (Signed)
[facility] No IV access

## 2017-07-09 NOTE — Progress Notes (Signed)
Patient seen and examined at bedside, patient admitted after midnight, please see earlier detailed admission note by Lorretta HarpXilin Niu, MD. Briefly, patient presented with AMS found to have a UTI. Currently being treated with ceftriaxone. Urine/Blood culture pending.   Jacquelin Hawkingalph Mohd Clemons, MD [facility] 07/09/2017, 4:28 PM Pager: [phone]

## 2017-07-09 NOTE — Progress Notes (Signed)
Pt arrives to 3east in fetal position with a foley in place, as well as a rectal tube for diarrhea (a result of kaexalate for hyperkalemia). Pt is hypotensive upon arrival. 79/44 via dinamap, manual recheck of 79/46. Pt continues to curl up into fetal position.   Pt placed on tele box 3e09. Second verifier completed.   Page to Dr Caleb PoppNettey to notify of hypotension, order received for one liter bolus; then recheck pressures. Order also received for lactic check and repeat EKG. If pt does not respond, MD will come discuss options with family.

## 2017-07-10 DIAGNOSIS — G309 Alzheimer's disease, unspecified: Secondary | ICD-10-CM | POA: Diagnosis present

## 2017-07-10 DIAGNOSIS — E871 Hypo-osmolality and hyponatremia: Secondary | ICD-10-CM | POA: Diagnosis present

## 2017-07-10 DIAGNOSIS — E1165 Type 2 diabetes mellitus with hyperglycemia: Secondary | ICD-10-CM | POA: Diagnosis present

## 2017-07-10 DIAGNOSIS — E861 Hypovolemia: Secondary | ICD-10-CM | POA: Diagnosis present

## 2017-07-10 DIAGNOSIS — N3000 Acute cystitis without hematuria: Secondary | ICD-10-CM | POA: Diagnosis not present

## 2017-07-10 DIAGNOSIS — Z66 Do not resuscitate: Secondary | ICD-10-CM | POA: Diagnosis present

## 2017-07-10 DIAGNOSIS — I959 Hypotension, unspecified: Secondary | ICD-10-CM | POA: Diagnosis present

## 2017-07-10 DIAGNOSIS — E43 Unspecified severe protein-calorie malnutrition: Secondary | ICD-10-CM | POA: Diagnosis present

## 2017-07-10 DIAGNOSIS — I5032 Chronic diastolic (congestive) heart failure: Secondary | ICD-10-CM | POA: Diagnosis present

## 2017-07-10 DIAGNOSIS — E860 Dehydration: Secondary | ICD-10-CM | POA: Diagnosis not present

## 2017-07-10 DIAGNOSIS — N390 Urinary tract infection, site not specified: Secondary | ICD-10-CM | POA: Diagnosis present

## 2017-07-10 DIAGNOSIS — I10 Essential (primary) hypertension: Secondary | ICD-10-CM

## 2017-07-10 DIAGNOSIS — Z681 Body mass index (BMI) 19 or less, adult: Secondary | ICD-10-CM | POA: Diagnosis not present

## 2017-07-10 DIAGNOSIS — Z515 Encounter for palliative care: Secondary | ICD-10-CM | POA: Diagnosis present

## 2017-07-10 DIAGNOSIS — E785 Hyperlipidemia, unspecified: Secondary | ICD-10-CM | POA: Diagnosis not present

## 2017-07-10 DIAGNOSIS — F0280 Dementia in other diseases classified elsewhere without behavioral disturbance: Secondary | ICD-10-CM | POA: Diagnosis not present

## 2017-07-10 DIAGNOSIS — L89112 Pressure ulcer of right upper back, stage 2: Secondary | ICD-10-CM | POA: Diagnosis present

## 2017-07-10 DIAGNOSIS — R627 Adult failure to thrive: Secondary | ICD-10-CM | POA: Diagnosis present

## 2017-07-10 DIAGNOSIS — G9341 Metabolic encephalopathy: Secondary | ICD-10-CM | POA: Diagnosis present

## 2017-07-10 DIAGNOSIS — D649 Anemia, unspecified: Secondary | ICD-10-CM | POA: Diagnosis present

## 2017-07-10 DIAGNOSIS — T17908S Unspecified foreign body in respiratory tract, part unspecified causing other injury, sequela: Secondary | ICD-10-CM | POA: Diagnosis not present

## 2017-07-10 DIAGNOSIS — N179 Acute kidney failure, unspecified: Secondary | ICD-10-CM | POA: Diagnosis present

## 2017-07-10 DIAGNOSIS — Z87891 Personal history of nicotine dependence: Secondary | ICD-10-CM | POA: Diagnosis not present

## 2017-07-10 DIAGNOSIS — T501X5A Adverse effect of loop [high-ceiling] diuretics, initial encounter: Secondary | ICD-10-CM | POA: Diagnosis not present

## 2017-07-10 DIAGNOSIS — Z8673 Personal history of transient ischemic attack (TIA), and cerebral infarction without residual deficits: Secondary | ICD-10-CM | POA: Diagnosis not present

## 2017-07-10 DIAGNOSIS — I110 Hypertensive heart disease with heart failure: Secondary | ICD-10-CM | POA: Diagnosis present

## 2017-07-10 DIAGNOSIS — R1310 Dysphagia, unspecified: Secondary | ICD-10-CM | POA: Diagnosis present

## 2017-07-10 DIAGNOSIS — J690 Pneumonitis due to inhalation of food and vomit: Secondary | ICD-10-CM | POA: Diagnosis not present

## 2017-07-10 DIAGNOSIS — E875 Hyperkalemia: Secondary | ICD-10-CM | POA: Diagnosis present

## 2017-07-10 LAB — GLUCOSE, CAPILLARY
GLUCOSE-CAPILLARY: 139 mg/dL — AB (ref 65–99)
Glucose-Capillary: 92 mg/dL (ref 65–99)
Glucose-Capillary: 82 mg/dL (ref 65–99)
Glucose-Capillary: 145 mg/dL — ABNORMAL HIGH (ref 65–99)

## 2017-07-10 LAB — CK: CK TOTAL: 204 U/L (ref 49–397)

## 2017-07-10 LAB — BASIC METABOLIC PANEL
Anion gap: 13 (ref 5–15)
BUN: 39 mg/dL — AB (ref 6–20)
CO2: 18 mmol/L — ABNORMAL LOW (ref 22–32)
Calcium: 8.3 mg/dL — ABNORMAL LOW (ref 8.9–10.3)
Chloride: 115 mmol/L — ABNORMAL HIGH (ref 101–111)
Creatinine, Ser: 0.86 mg/dL (ref 0.61–1.24)
GFR calc Af Amer: >60 mL/min (ref >60)
GLUCOSE: 100 mg/dL — AB (ref 65–99)
Potassium: 3.5 mmol/L (ref 3.5–5.1)
SODIUM: 146 mmol/L — AB (ref 135–145)

## 2017-07-10 LAB — CBC
HCT: 24.6 % — ABNORMAL LOW (ref 39.0–52.0)
Hemoglobin: 7.7 g/dL — ABNORMAL LOW (ref 13.0–17.0)
MCH: 27.2 pg (ref 26.0–34.0)
MCHC: 31.3 g/dL (ref 30.0–36.0)
MCV: 86.9 fL (ref 78.0–100.0)
PLATELETS: 537 10*3/uL — AB (ref 150–400)
RBC: 2.83 MIL/uL — ABNORMAL LOW (ref 4.22–5.81)
RDW: 14.2 % (ref 11.5–15.5)
WBC: 12 10*3/uL — AB (ref 4.0–10.5)

## 2017-07-10 LAB — CREATININE, URINE, RANDOM: CREATININE, URINE: 39.63 mg/dL

## 2017-07-10 LAB — SODIUM, URINE, RANDOM: Sodium, Ur: 70 mmol/L

## 2017-07-10 LAB — URINE CULTURE

## 2017-07-10 MED ORDER — DEXTROSE-NACL 5-0.45 % IV SOLN
INTRAVENOUS | Status: DC
  Administered 2017-07-10 – 2017-07-11 (×3): via INTRAVENOUS

## 2017-07-10 NOTE — Care Management Obs Status (Signed)
MEDICARE OBSERVATION STATUS NOTIFICATION   Patient Details  Name: Eric CopaRobert Cameron MRN: [id number] Date of Birth: 04/04/1935   Medicare Observation Status Notification Given:  Yes    Lawerance Sabalebbie Heidie Krall, RN 07/10/2017, 10:05 AM

## 2017-07-10 NOTE — Progress Notes (Signed)
Initial Nutrition Assessment  DOCUMENTATION CODES:   Underweight, Severe malnutrition in context of chronic illness  INTERVENTION:   -D/c Ensure Enlive po BID, each supplement provides 350 kcal and 20 grams of protein -30 ml Prostat TID, each supplement provides 100 kcals and 15 grams protein -MVI daily -Consider SLP evaluation for safest diet -RD will make further recommendations based upon decisions regarding goals of care  NUTRITION DIAGNOSIS:   Severe Malnutrition related to chronic illness(dementia) as evidenced by moderate fat depletion, severe fat depletion, moderate muscle depletion, severe muscle depletion.  GOAL:   Patient will meet greater than or equal to 90% of their needs  MONITOR:   PO intake, Supplement acceptance, Diet advancement, Labs, Weight trends, Skin, I & O's  REASON FOR ASSESSMENT:   Malnutrition Screening Tool, Consult Assessment of nutrition requirement/status  ASSESSMENT:   Eric Cameron is a 83 y.o. male with medical history significant of dementia, hypertension, hyperlipidemia, diabetes mellitus, stroke, depression, anxiety, BPH, seizure, who presents with failure to thrive, decreased oral intake, generalized weakness, dysuria.  Pt admitted with UTI.   Pt somnolent at time of visit; did not arouse during exam or when name was called. Spoke with phlebotomist in room, who reports pt did not respond to her touch. No family in room to obtain further hx.   No meal completion data available. Suspect poor po intake during hospitalization and PTA. Pt with multiple food items (milk, ice cream) on tray table that were unopened.   Reviewed wt hx; noted 15% wt loss over the past month, which is significant for time frame.   PTA medications include vitamin B-12, thiamine, folate, Ensure Plus BID, and 30 ml Prostat BID.   Palliative care consult has been ordered to discuss goals of care.   Last Hgb A1c: 7.9 (02/02/17). This is indicative of fair  control, given advanced age. PTA DM medications 1000 mg metformin XR BID.   Labs reviewed: Na: 146, CBGS: 91-111 (inpatient orders for glycemc control are 0-5 units insulin aspart q HS and 0-9 units insulin aspart TID with meals>  NUTRITION - FOCUSED PHYSICAL EXAM:    Most Recent Value  Orbital Region  Moderate depletion  Upper Arm Region  Severe depletion  Thoracic and Lumbar Region  Moderate depletion  Buccal Region  Severe depletion  Temple Region  Severe depletion  Clavicle Bone Region  Severe depletion  Clavicle and Acromion Bone Region  Severe depletion  Scapular Bone Region  Severe depletion  Dorsal Hand  Moderate depletion  Patellar Region  Severe depletion  Anterior Thigh Region  Severe depletion  Posterior Calf Region  Severe depletion  Edema (RD Assessment)  None  Hair  Reviewed  Eyes  Unable to assess  Mouth  Unable to assess  Skin  Reviewed  Nails  Reviewed       Diet Order:  Seizure precautions Diet full liquid Room service appropriate? Yes; Fluid consistency: Thin  EDUCATION NEEDS:   Not appropriate for education at this time  Skin:  Skin Assessment: Skin Integrity Issues: Skin Integrity Issues:: Stage I, Stage II Stage I: lt hip Stage II: rt hip, rt lateral shoulder  Last BM:  07/09/17  Height:   Ht Readings from Last 1 Encounters:  07/09/17 5\' 9"  (1.753 m)    Weight:   Wt Readings from Last 1 Encounters:  07/10/17 96 lb 9 oz (43.8 kg)    Ideal Body Weight:  72.7 kg  BMI:  Body mass index is 14.26 kg/m.  Estimated Nutritional  Needs:   Kcal:  1300-1500  Protein:  55-70 grams  Fluid:  > 1.3 L    Eric Cameron, RD, LDN, CDE Pager: [phone] After hours Pager: [phone]

## 2017-07-10 NOTE — Progress Notes (Signed)
PT Cancellation Note  Patient Details Name: Eric CopaRobert Cameron MRN: [id number] DOB: Oct 15, 1934   Cancelled Treatment:    Reason Eval/Treat Not Completed: Medical issues which prohibited therapy RN requests hold today due to critically low BPs as well as ongoing lethargy, patient not appropriate to participate in PT today due to medical status. Hold PT for today, plan to check back in with nursing on next day of service.    Nedra HaiKristen Unger PT, DPT, CBIS  Supplemental Physical Therapist [facility]   Pager [phone]

## 2017-07-10 NOTE — Progress Notes (Addendum)
PROGRESS NOTE    Eric Cameron  RUE:[id number] DOB: 1934-07-22 DOA: 07/08/2017 PCP: Iona Hansen, NP   Brief Narrative: Eric Cameron is a 83 y.o. male with medical history significant of dementia, hypertension, hyperlipidemia, diabetes mellitus, stroke, depression, anxiety, BPH, seizure. Patient presented with weakness, dysuria, failure to thrive. On ceftriaxone with cultures pending. Palliative care consulted.   Assessment & Plan:   Principal Problem:   UTI (urinary tract infection) Active Problems:   HTN (hypertension)   Diabetes mellitus type 2, uncontrolled (HCC)   HLD (hyperlipidemia)   H/O: CVA (cerebrovascular accident)   Alzheimer's dementia without behavioral disturbance   Acute renal failure (ARF) (HCC)   Severe protein-calorie malnutrition (HCC)   Normocytic anemia   Failure to thrive in adult   Hyperkalemia   Hyponatremia   UTI Questionable urinalysis. Not a clean catch. No bacteria seen. Urine culture obtained. No other source. Afebrile. -Continue ceftriaxone  Hypotension Likely secondary to lasix from treatment of hyperkalemia. Improved today, but still slightly low -Continue IV fluids  Acute metabolic encephalopathy Per patient's sister, patient was more alert and would ambulate, eat solid foods, etc. Patient does have pressure ulcers on right side and he shows favor for lying down on his right side in the hospital. Likely secondary to UTI. No evidence of pneumonia on chest x-ray -Treat UTI -Blood cultures pending -SLP eval  Dementia Failure to thrive Appears advanced. Patient now with decreased oral intake. 17 lb weight loss from February 2019. -Palliative care consult -SLP eval -Dietitian  Essential hypertension Hold amlodipine-benazepril secondary to hypotension  Acute kidney injury Secondary to hypovolemia. Resolved with IV fluids  Hyponatremia Resolved shortly after admission.  Diabetes mellitus, type 2 Last A1C of 7.9%. -Continue  SSI -On D5 1/2 IV fluids  Hyperlipidemia -Continue Lipitor as able  History of CVA -Continue Aspirin and Lipitor  Normocytic anemia No evidence of blood loss. Decrease overnight in light of significant fluid resuscitation.   Hyperkalemia In setting of AKI. Resolved with treatment.   DVT prophylaxis: Lovenox Code Status:   Code Status: DNR Family Communication: Sister on telephone Disposition Plan: Discharge when encephalopathy improved and pending treatment of ?UTI, palliative care meeting   Consultants:   Palliative care  Procedures:   None  Antimicrobials:  Ceftriaxone    Subjective: Patient covers his head when trying to evaluate him.  Objective: Vitals:   07/10/17 0457 07/10/17 0503 07/10/17 0509 07/10/17 0800  BP: (!) 71/49 (!) 81/47 (!) 84/36 (!) 81/45  Pulse: 93     Resp: 18     Temp: 98.6 F (37 C)     TempSrc: Oral     SpO2: 100%   98%  Weight: 43.8 kg (96 lb 9 oz)     Height:        Intake/Output Summary (Last 24 hours) at 07/10/2017 1059 Last data filed at 07/10/2017 0609 Gross per 24 hour  Intake 1853.75 ml  Output 3200 ml  Net -1346.25 ml   Filed Weights   07/09/17 1058 07/09/17 1729 07/10/17 0457  Weight: 49.9 kg (110 lb) 45 kg (99 lb 3.3 oz) 43.8 kg (96 lb 9 oz)    Examination:  General exam: Appears calm and comfortable. Curled on his left side today Respiratory system: Diminished but clear to auscultation. Respiratory effort normal. Cardiovascular system: S1 & S2 heard, RRR. No murmurs, rubs, gallops or clicks. Gastrointestinal system: Abdomen is nondistended, soft and nontender. No organomegaly or masses felt. Normal bowel sounds heard. Central nervous system: Sleeping but  arouses. Responds to voice. Extremities: No edema. No calf tenderness Skin: No cyanosis. No rashes Psychiatry: Judgement and insight appear normal. Mood & affect appropriate.     Data Reviewed: I have personally reviewed following labs and imaging  studies  CBC: Recent Labs  Lab 07/08/17 2124 07/09/17 0345 07/09/17 0758 07/10/17 0636  WBC 17.7* 26.6* 18.2* 12.0*  HGB 8.4* 8.7* 8.7* 7.7*  HCT 26.1* 27.4* 27.3* 24.6*  MCV 87.3 87.3 86.7 86.9  PLT 577* 697* 577* 537*   Basic Metabolic Panel: Recent Labs  Lab 07/08/17 2124 07/09/17 0345 07/09/17 0758 07/09/17 1410 07/10/17 0636  NA 128* 136 141  --  146*  K 7.3* 6.2* 6.0* 4.5 3.5  CL 105 109 114*  --  115*  CO2 14* 16* 17*  --  18*  GLUCOSE 158* 52* 139*  --  100*  BUN 79* 65* 56*  --  39*  CREATININE 1.42* 1.16 1.10  --  0.86  CALCIUM 9.4 10.9* 10.1  --  8.3*   GFR: Estimated Creatinine Clearance: 42.4 mL/min (by C-G formula based on SCr of 0.86 mg/dL). Liver Function Tests: Recent Labs  Lab 07/08/17 2124  AST 17  ALT 18  ALKPHOS 63  BILITOT 0.3  PROT 8.3*  ALBUMIN 2.3*   Recent Labs  Lab 07/08/17 2124  LIPASE 78*   No results for input(s): AMMONIA in the last 168 hours. Coagulation Profile: No results for input(s): INR, PROTIME in the last 168 hours. Cardiac Enzymes: No results for input(s): CKTOTAL, CKMB, CKMBINDEX, TROPONINI in the last 168 hours. BNP (last 3 results) No results for input(s): PROBNP in the last 8760 hours. HbA1C: No results for input(s): HGBA1C in the last 72 hours. CBG: Recent Labs  Lab 07/09/17 0719 07/09/17 1156 07/09/17 1804 07/09/17 2108 07/10/17 0815  GLUCAP 111* 197* 102* 111* 92   Lipid Profile: No results for input(s): CHOL, HDL, LDLCALC, TRIG, CHOLHDL, LDLDIRECT in the last 72 hours. Thyroid Function Tests: Recent Labs    07/09/17 0345  TSH 0.410   Anemia Panel: No results for input(s): VITAMINB12, FOLATE, FERRITIN, TIBC, IRON, RETICCTPCT in the last 72 hours. Sepsis Labs: Recent Labs  Lab 07/09/17 1952  LATICACIDVEN 1.0    No results found for this or any previous visit (from the past 240 hour(s)).       Radiology Studies: Dg Abdomen 1 View  Result Date: 07/09/2017 CLINICAL DATA:  NG tube  confirmation. EXAM: ABDOMEN - 1 VIEW COMPARISON:  None. FINDINGS: Enteric tube in place with tip and side-port below the diaphragm, however the tip is at the gastric fundus directed cranially. No bowel dilatation in the included upper abdomen. No evidence of free air. IMPRESSION: Tip and side port of the enteric tube below the diaphragm in the stomach, however the tube is looped with tip directed cranially. If tube is to be used for feeding, consider retraction of 5-10 cm. Electronically Signed   By: Rubye Oaks M.D.   On: 07/09/2017 06:15   Dg Chest Portable 1 View  Result Date: 07/09/2017 CLINICAL DATA:  Weakness. EXAM: PORTABLE CHEST 1 VIEW COMPARISON:  Radiograph 02/01/2017 FINDINGS: The cardiomediastinal contours are unchanged with unchanged aortic tortuosity. Multiple skin folds project over the left chest wall. Pulmonary vasculature is normal. No consolidation, pleural effusion, or pneumothorax. No acute osseous abnormalities are seen. IMPRESSION: No active disease. Electronically Signed   By: Rubye Oaks M.D.   On: 07/09/2017 02:39        Scheduled Meds: . aspirin  325 mg Oral Daily  . atorvastatin  20 mg Oral Daily  . citalopram  10 mg Oral QHS  . enoxaparin (LOVENOX) injection  40 mg Subcutaneous Q24H  . feeding supplement (ENSURE ENLIVE)   Oral BID  . finasteride  5 mg Oral Daily  . folic acid  1 mg Oral Daily  . insulin aspart  0-5 Units Subcutaneous QHS  . insulin aspart  0-9 Units Subcutaneous TID WC  . memantine  10 mg Oral BID  . tamsulosin  0.4 mg Oral Daily  . thiamine  100 mg Oral Daily  . vitamin B-12  100 mcg Oral Daily   Continuous Infusions: . cefTRIAXone (ROCEPHIN)  IV Stopped (07/10/17 [id number])  . dextrose 5 % and 0.45% NaCl       LOS: 0 days     Jacquelin Hawkingalph Edwards Mckelvie, MD [facility] 07/10/2017, 10:59 AM Pager: [phone]  If 7PM-7AM, please contact night-coverage [email or web address] Password [email or web address] 07/10/2017, 10:59 AM

## 2017-07-10 NOTE — Evaluation (Signed)
Clinical/Bedside Swallow Evaluation Patient Details  Name: Eric Cameron MRN: [id number] Date of Birth: [birth year]  Today's Date: 07/10/2017 Time: SLP Start Time (ACUTE ONLY): 1303 SLP Stop Time (ACUTE ONLY): 1324 SLP Time Calculation (min) (ACUTE ONLY): 21 min  Past Medical History:  Past Medical History:  Diagnosis Date  . Acute encephalopathy   . Alzheimer disease   . Anxiety   . Dehydration   . Dementia   . Depression   . Diabetes mellitus without complication (HCC)   . Elevated troponin   . Glaucoma   . HLD (hyperlipidemia)   . Hypertension   . PNA (pneumonia)   . Pressure ulcer   . Seizures (HCC)   . Stroke (cerebrum) [facility](HCC)    Past Surgical History:  Past Surgical History:  Procedure Laterality Date  . NO PAST SURGERIES     HPI:  83 y.o. male with medical history significant of dementia, hypertension, hyperlipidemia, diabetes mellitus, stroke, depression, anxiety, BPH, seizure, who presents with failure to thrive, decreased oral intake, generalized weakness, dysuria. 07/09/17 CXR negative for acute processes.  Assessment / Plan / Recommendation Clinical Impression   Pt with cognitive-based oropharyngeal dysphagia characterized by decreased awareness of bolus requiring moderate verbal/tactile cues to swallow, oral holding and delay in the initiation of the swallow; no overt s/s of aspiration noted during BSE, although limited d/t lethargy; pt was eager to eat when verbally cued during PO consumption, but required full supervision and frequent verbal cueing during intake; Recommend initiate Dysphagia 1 (puree)/thin liquid conservative diet d/t cognitive impairment/lethargy and progress as pt mentation improves; ST will f/u while in acute setting. SLP Visit Diagnosis: Dysphagia, oropharyngeal phase (R13.12)    Aspiration Risk  Mild aspiration risk    Diet Recommendation   Dysphagia 1 (puree)/thin liquids  Medication Administration: Crushed with puree    Other   Recommendations Oral Care Recommendations: Oral care BID   Follow up Recommendations 24 hour supervision/assistance;Other (comment)(TBD)      Frequency and Duration min 2x/week  1 week       Prognosis Prognosis for Safe Diet Advancement: Good Barriers to Reach Goals: Cognitive deficits      Swallow Study   General Date of Onset: 07/08/17 HPI: 83 y.o. male with medical history significant of dementia, hypertension, hyperlipidemia, diabetes mellitus, stroke, depression, anxiety, BPH, seizure, who presents with failure to thrive, decreased oral intake, generalized weakness, dysuria. Type of Study: Bedside Swallow Evaluation Diet Prior to this Study: Thin liquids;Other (Comment)(Full liquids) Temperature Spikes Noted: Yes Respiratory Status: Room air History of Recent Intubation: No Behavior/Cognition: Cooperative;Lethargic/Drowsy Oral Cavity Assessment: Within Functional Limits Oral Care Completed by SLP: Recent completion by staff Oral Cavity - Dentition: Poor condition;Missing dentition Self-Feeding Abilities: Total assist Patient Positioning: Upright in bed Baseline Vocal Quality: Low vocal intensity Volitional Cough: Cognitively unable to elicit Volitional Swallow: Unable to elicit    Oral/Motor/Sensory Function Overall Oral Motor/Sensory Function: Generalized oral weakness   Ice Chips Ice chips: Not tested   Thin Liquid Thin Liquid: Impaired Presentation: Cup;Straw Oral Phase Functional Implications: Oral holding Pharyngeal  Phase Impairments: Suspected delayed Swallow    Nectar Thick Nectar Thick Liquid: Not tested   Honey Thick Honey Thick Liquid: Not tested   Puree Puree: Impaired Presentation: Spoon Oral Phase Impairments: Poor awareness of bolus Oral Phase Functional Implications: Prolonged oral transit;Oral holding Pharyngeal Phase Impairments: Suspected delayed Swallow   Solid      Solid: Not tested        Tressie StalkerPat Devi Hopman, M.S., CCC-SLP 07/10/2017,1:32  PM

## 2017-07-10 NOTE — Plan of Care (Signed)
  Problem: Clinical Measurements: Goal: Respiratory complications will improve Outcome: Progressing   Problem: Nutrition: Goal: Adequate nutrition will be maintained Outcome: Progressing   Problem: Safety: Goal: Ability to remain free from injury will improve Outcome: Progressing   

## 2017-07-11 DIAGNOSIS — Z515 Encounter for palliative care: Secondary | ICD-10-CM

## 2017-07-11 LAB — PROCALCITONIN: Procalcitonin: <0.1 ng/mL

## 2017-07-11 LAB — BASIC METABOLIC PANEL
ANION GAP: 8 (ref 5–15)
BUN: 25 mg/dL — AB (ref 6–20)
CHLORIDE: 118 mmol/L — AB (ref 101–111)
CO2: 20 mmol/L — AB (ref 22–32)
CREATININE: 0.75 mg/dL (ref 0.61–1.24)
Calcium: 8.4 mg/dL — ABNORMAL LOW (ref 8.9–10.3)
GFR calc non Af Amer: >60 mL/min (ref >60)
GLUCOSE: 147 mg/dL — AB (ref 65–99)
POTASSIUM: 3.3 mmol/L — AB (ref 3.5–5.1)
Sodium: 146 mmol/L — ABNORMAL HIGH (ref 135–145)

## 2017-07-11 LAB — CBC
HEMATOCRIT: 24.1 % — AB (ref 39.0–52.0)
Hemoglobin: 7.4 g/dL — ABNORMAL LOW (ref 13.0–17.0)
MCH: 26.9 pg (ref 26.0–34.0)
MCHC: 30.7 g/dL (ref 30.0–36.0)
MCV: 87.6 fL (ref 78.0–100.0)
PLATELETS: 492 10*3/uL — AB (ref 150–400)
RBC: 2.75 MIL/uL — ABNORMAL LOW (ref 4.22–5.81)
RDW: 14.4 % (ref 11.5–15.5)
WBC: 8.7 10*3/uL (ref 4.0–10.5)

## 2017-07-11 LAB — GLUCOSE, CAPILLARY
Glucose-Capillary: 134 mg/dL — ABNORMAL HIGH (ref 65–99)
Glucose-Capillary: 284 mg/dL — ABNORMAL HIGH (ref 65–99)
Glucose-Capillary: 138 mg/dL — ABNORMAL HIGH (ref 65–99)
Glucose-Capillary: 143 mg/dL — ABNORMAL HIGH (ref 65–99)

## 2017-07-11 MED ORDER — HYDRALAZINE HCL 20 MG/ML IJ SOLN: 5.0000 mg | Freq: Three times a day (TID) | INTRAMUSCULAR | Status: DC | PRN

## 2017-07-11 NOTE — Progress Notes (Signed)
[facility]; Abelino DerrickB Osei Anger [facility],MHA,BSN [phone]

## 2017-07-11 NOTE — Progress Notes (Signed)
PROGRESS NOTE    Eric Cameron  ZOX:[id number] DOB: 1934/08/16 DOA: 07/08/2017 PCP: Iona Hansen, NP   Brief Narrative: Eric Cameron is a 83 y.o. male with medical history significant of dementia, hypertension, hyperlipidemia, diabetes mellitus, stroke, depression, anxiety, BPH, seizure. Patient presented with weakness, dysuria, failure to thrive. On ceftriaxone with cultures pending. Palliative care consulted.   Assessment & Plan:   Principal Problem:   UTI (urinary tract infection) Active Problems:   HTN (hypertension)   Diabetes mellitus type 2, uncontrolled (HCC)   HLD (hyperlipidemia)   H/O: CVA (cerebrovascular accident)   Alzheimer's dementia without behavioral disturbance   Acute renal failure (ARF) (HCC)   Severe protein-calorie malnutrition (HCC)   Normocytic anemia   Failure to thrive in adult   Hyperkalemia   Hyponatremia   UTI Questionable urinalysis. Not a clean catch. No bacteria seen. Urine culture obtained. No other source. Afebrile. Urine culture unhelpful but patient is improving with current antibiotic regimen -Continue ceftriaxone  Hypotension Lactic acid normal. Patient's mental status improving. No tachycardia. Positioning likely playing a role in readings. -Continue IV fluids; watch status (chronic diastolic heart failure)  Acute metabolic encephalopathy Per patient's sister, patient was more alert and would ambulate, eat solid foods, etc. Patient does have pressure ulcers on right side and he shows favor for lying down on his right side in the hospital. Likely secondary to UTI. No evidence of pneumonia on chest x-ray. Improving. Urine culture with multiple species. -Blood cultures pending (no growth to date) -SLP: Dysphagia 1  Dementia Failure to thrive Appears advanced. Patient now with decreased oral intake. 17 lb weight loss from February 2019. -Palliative care consult -SLP eval -Dietitian  Essential hypertension Hold  amlodipine-benazepril secondary to hypotension  Acute kidney injury Secondary to hypovolemia. Resolved with IV fluids  Hyponatremia Resolved shortly after admission.  Diabetes mellitus, type 2 Last A1C of 7.9%. -Continue SSI -On D5 1/2 IV fluids  Hyperlipidemia -Continue Lipitor as able  History of CVA -Continue Aspirin and Lipitor  Normocytic anemia No evidence of blood loss. Decrease overnight in light of significant fluid resuscitation.   Hyperkalemia In setting of AKI. Resolved with treatment.  Chronic diastolic heart failure Grade 1 diastolic dysfunction from Transthoracic Echocardiogram in 2017. Stable.   DVT prophylaxis: Lovenox Code Status:   Code Status: DNR Family Communication: Sister on telephone Disposition Plan: Discharge when encephalopathy improved and pending treatment of ?UTI, palliative care meeting and PT for discharge planning   Consultants:   Palliative care  Procedures:   None  Antimicrobials:  Ceftriaxone    Subjective: Patient replies "mm-hmm" to all questions asked.  Objective: Vitals:   07/10/17 1300 07/10/17 2100 07/11/17 0500 07/11/17 0748  BP: (!) 98/46 (!) 83/53 (!) 95/59 (!) 96/40  Pulse: 87 78 87 77  Resp: 18 20 20 18   Temp: 98.8 F (37.1 C) 97.8 F (36.6 C) 97.8 F (36.6 C) 98 F (36.7 C)  TempSrc: Oral Oral Oral Axillary  SpO2: 100% 100% 94% 99%  Weight:   45.4 kg (100 lb 1.4 oz)   Height:        Intake/Output Summary (Last 24 hours) at 07/11/2017 0842 Last data filed at 07/11/2017 8119 Gross per 24 hour  Intake 1144.5 ml  Output 1450 ml  Net -305.5 ml   Filed Weights   07/09/17 1729 07/10/17 0457 07/11/17 0500  Weight: 45 kg (99 lb 3.3 oz) 43.8 kg (96 lb 9 oz) 45.4 kg (100 lb 1.4 oz)    Examination:  General exam: Appears calm and comfortable. Curled on left side Respiratory: Clear to auscultation bilaterally. Diminished. Unlabored work of breathing. No wheezing or rales. Cardiovascular system:  Regular rate and rhythm. Normal S1 and S2. No heart murmurs present. No extra heart sounds Gastrointestinal system: Soft, non-tender, non-distended, no guarding, no rebound, no masses felt Normal bowel sounds heard. Central nervous system: Sleeping but arouses. Responds to voice. Follows commands to grab my fingers and squeeze Extremities: No edema. No calf tenderness Skin: No cyanosis. No rashes Psychiatry: withdrawn, flat affect    Data Reviewed: I have personally reviewed following labs and imaging studies  CBC: Recent Labs  Lab 07/08/17 2124 07/09/17 0345 07/09/17 0758 07/10/17 0636 07/11/17 0804  WBC 17.7* 26.6* 18.2* 12.0* 8.7  HGB 8.4* 8.7* 8.7* 7.7* 7.4*  HCT 26.1* 27.4* 27.3* 24.6* 24.1*  MCV 87.3 87.3 86.7 86.9 87.6  PLT 577* 697* 577* 537* 492*   Basic Metabolic Panel: Recent Labs  Lab 07/08/17 2124 07/09/17 0345 07/09/17 0758 07/09/17 1410 07/10/17 0636  NA 128* 136 141  --  146*  K 7.3* 6.2* 6.0* 4.5 3.5  CL 105 109 114*  --  115*  CO2 14* 16* 17*  --  18*  GLUCOSE 158* 52* 139*  --  100*  BUN 79* 65* 56*  --  39*  CREATININE 1.42* 1.16 1.10  --  0.86  CALCIUM 9.4 10.9* 10.1  --  8.3*   GFR: Estimated Creatinine Clearance: 44 mL/min (by C-G formula based on SCr of 0.86 mg/dL). Liver Function Tests: Recent Labs  Lab 07/08/17 2124  AST 17  ALT 18  ALKPHOS 63  BILITOT 0.3  PROT 8.3*  ALBUMIN 2.3*   Recent Labs  Lab 07/08/17 2124  LIPASE 78*   No results for input(s): AMMONIA in the last 168 hours. Coagulation Profile: No results for input(s): INR, PROTIME in the last 168 hours. Cardiac Enzymes: Recent Labs  Lab 07/10/17 1103  CKTOTAL 204   BNP (last 3 results) No results for input(s): PROBNP in the last 8760 hours. HbA1C: No results for input(s): HGBA1C in the last 72 hours. CBG: Recent Labs  Lab 07/10/17 0815 07/10/17 1233 07/10/17 1636 07/10/17 2051 07/11/17 0751  GLUCAP 92 82 145* 139* 134*   Lipid Profile: No results  for input(s): CHOL, HDL, LDLCALC, TRIG, CHOLHDL, LDLDIRECT in the last 72 hours. Thyroid Function Tests: Recent Labs    07/09/17 0345  TSH 0.410   Anemia Panel: No results for input(s): VITAMINB12, FOLATE, FERRITIN, TIBC, IRON, RETICCTPCT in the last 72 hours. Sepsis Labs: Recent Labs  Lab 07/09/17 1952  LATICACIDVEN 1.0    Recent Results (from the past 240 hour(s))  Urine Culture     Status: Abnormal   Collection Time: 07/09/17  1:46 AM  Result Value Ref Range Status   Specimen Description URINE, RANDOM  Final   Special Requests   Final    NONE Performed at [facility], [address] [place], [place] [address]    Culture MULTIPLE SPECIES PRESENT, SUGGEST RECOLLECTION (A)  Final   Report Status 07/10/2017 FINAL  Final  Culture, blood (Routine X 2) w Reflex to ID Panel     Status: None (Preliminary result)   Collection Time: 07/09/17  3:42 AM  Result Value Ref Range Status   Specimen Description BLOOD RIGHT HAND  Final   Special Requests IN PEDIATRIC BOTTLE Blood Culture adequate volume  Final   Culture   Final    NO GROWTH 1  DAY Performed at [facility], [address] [place], [place] [address]    Report Status PENDING  Incomplete  Culture, blood (Routine X 2) w Reflex to ID Panel     Status: None (Preliminary result)   Collection Time: 07/09/17  3:42 AM  Result Value Ref Range Status   Specimen Description BLOOD RIGHT HAND  Final   Special Requests   Final    BOTTLES DRAWN AEROBIC AND ANAEROBIC Blood Culture adequate volume   Culture   Final    NO GROWTH 1 DAY Performed at [facility], [address] [place], [place] [address]    Report Status PENDING  Incomplete         Radiology Studies: No results found.      Scheduled Meds: . aspirin  325 mg Oral Daily  . atorvastatin  20 mg Oral Daily  . citalopram  10 mg Oral QHS  . enoxaparin (LOVENOX) injection  40 mg Subcutaneous Q24H  . feeding supplement (ENSURE ENLIVE)   Oral BID   . finasteride  5 mg Oral Daily  . folic acid  1 mg Oral Daily  . insulin aspart  0-5 Units Subcutaneous QHS  . insulin aspart  0-9 Units Subcutaneous TID WC  . memantine  10 mg Oral BID  . tamsulosin  0.4 mg Oral Daily  . thiamine  100 mg Oral Daily  . vitamin B-12  100 mcg Oral Daily   Continuous Infusions: . cefTRIAXone (ROCEPHIN)  IV Stopped (07/11/17 0545)  . dextrose 5 % and 0.45% NaCl 75 mL/hr at 07/11/17 0243     LOS: 1 day     Jacquelin Hawking, MD [facility] 07/11/2017, 8:42 AM Pager: [phone]  If 7PM-7AM, please contact night-coverage [email or web address] Password [facility] 07/11/2017, 8:42 AM

## 2017-07-11 NOTE — Consult Note (Addendum)
Consultation Note Date: 07/11/2017   Patient Name: Eric Cameron  DOB: 1934-09-20  MRN: [id number]  Age / Sex: 83 y.o., male  PCP: Iona Hansen, NP Referring Physician: Narda Bonds, MD  Reason for Consultation: Establishing goals of care and Psychosocial/spiritual support  HPI/Patient Profile: 83 y.o. male   admitted on 07/08/2017 with a past medical history significant ofdementia, hypertension, hyperlipidemia, diabetes mellitus, stroke, depression, anxiety, BPH, seizure.   Patient presented with weakness, dysuria, failure to thrive. Admitted for stabilization.  Patient with continued physical, functional and cognitive decline over the last year.  Overall failure to thrive with high risk for decompensation.  Total care at home.  Family face advance directive and end-of-life decisions and anticipatory care needs.  Clinical Assessment and Goals of Care:  This NP Lorinda Creed reviewed medical records, received report from team, assessed the patient and then meet at the patient's bedside along with his sister Nadean Corwin to discuss diagnosis, prognosis, GOC, EOL wishes disposition and options.  Gust the natural trajectory and expectations of end-stage dementia specifically as it relates to poor p.o. intake and the natural dehydration.  Concept of Hospice and Palliative Care were discussed  A detailed discussion was had today regarding advanced directives.  Concepts specific to code status, artifical feeding and hydration, continued IV antibiotics and rehospitalization was had.  The difference between a aggressive medical intervention path  and a palliative comfort care path for this patient at this time was had.  Values and goals of care important to patient and family were attempted to be elicited.  MOST form introduced  Natural trajectory and expectations at EOL were discussed.  Questions and  concerns addressed.   Family encouraged to call with questions or concerns.    PMT will continue to support holistically.   NEXT OF KIN    SUMMARY OF RECOMMENDATIONS    Focus of care is comfort, quality and dignity.  Code Status/Advance Care Planning:  DNR   Symptom Management:   Dysphagia: Per speech therapy cognitive based oropharyngeal dysphasia.                      Family verbalize no desire for artificial feeding now or in the future.                      Recommended dysphasia 1 with thin liquids.                     Aspiration precautions   Palliative Prophylaxis:   Aspiration, Bowel Regimen, Delirium Protocol, Frequent Pain Assessment, Oral Care and Palliative Wound Care  Additional Recommendations (Limitations, Scope, Preferences):  Avoid Hospitalization, Minimize Medications and No Artificial Feeding   Family open to oral antibiotics on discharge   Psycho-social/Spiritual:   Desire for further Chaplaincy support:no  Additional Recommendations: Education on Hospice  Prognosis:   < 6 months  Discharge Planning: Home with Hospice      Primary Diagnoses: Present on Admission: . UTI (urinary tract  infection) . Severe protein-calorie malnutrition (HCC) . HLD (hyperlipidemia) . Diabetes mellitus type 2, uncontrolled (HCC) . Alzheimer's dementia without behavioral disturbance . Failure to thrive in adult . Hyperkalemia . Hyponatremia . Normocytic anemia . Acute renal failure (ARF) (HCC) . HTN (hypertension)   I have reviewed the medical record, interviewed the patient and family, and examined the patient. The following aspects are pertinent.  Past Medical History:  Diagnosis Date  . Acute encephalopathy   . Alzheimer disease   . Anxiety   . Dehydration   . Dementia   . Depression   . Diabetes mellitus without complication (HCC)   . Elevated troponin   . Glaucoma   . HLD (hyperlipidemia)   . Hypertension   . PNA (pneumonia)   . Pressure  ulcer   . Seizures (HCC)   . Stroke (cerebrum) [facility])    Social History   Socioeconomic History  . Marital status: Single    Spouse name: Not on file  . Number of children: 0  . Years of education: 4  . Highest education level: Not on file  Occupational History  . Occupation: retired  Engineer, production  . Financial resource strain: Not on file  . Food insecurity:    Worry: Not on file    Inability: Not on file  . Transportation needs:    Medical: Not on file    Non-medical: Not on file  Tobacco Use  . Smoking status: Former Smoker    Last attempt to quit: 07/07/2010    Years since quitting: 7.0  . Smokeless tobacco: Never Used  Substance and Sexual Activity  . Alcohol use: No  . Drug use: No  . Sexual activity: Not on file  Lifestyle  . Physical activity:    Days per week: Not on file    Minutes per session: Not on file  . Stress: Not on file  Relationships  . Social connections:    Talks on phone: Not on file    Gets together: Not on file    Attends religious service: Not on file    Active member of club or organization: Not on file    Attends meetings of clubs or organizations: Not on file    Relationship status: Not on file  Other Topics Concern  . Not on file  Social History Narrative   Lives with sister and brother   Caffeine use: Tea    Very little coffee   Soda sometimes   Family History  Problem Relation Age of Onset  . Diabetes Mother   . Cancer Father        colon or prostate  . Diabetes Mellitus II Brother   . Diabetes Sister    Scheduled Meds: . aspirin  325 mg Oral Daily  . atorvastatin  20 mg Oral Daily  . citalopram  10 mg Oral QHS  . enoxaparin (LOVENOX) injection  40 mg Subcutaneous Q24H  . feeding supplement (ENSURE ENLIVE)   Oral BID  . finasteride  5 mg Oral Daily  . folic acid  1 mg Oral Daily  . insulin aspart  0-5 Units Subcutaneous QHS  . insulin aspart  0-9 Units Subcutaneous TID WC  . memantine  10 mg Oral BID  . tamsulosin   0.4 mg Oral Daily  . thiamine  100 mg Oral Daily  . vitamin B-12  100 mcg Oral Daily   Continuous Infusions: . cefTRIAXone (ROCEPHIN)  IV Stopped (07/11/17 0545)  . dextrose 5 % and 0.45%  NaCl 75 mL/hr at 07/11/17 0243   PRN Meds:.acetaminophen **OR** acetaminophen, hydrALAZINE, ondansetron **OR** ondansetron (ZOFRAN) IV, polyethylene glycol, zolpidem Medications Prior to Admission:  Prior to Admission medications   Medication Sig Start Date End Date Taking? Authorizing Provider  Amino Acids (HIGH PROTEIN PO) Take 30 mLs by mouth 2 (two) times daily.    Yes [provider]  amLODipine-benazepril (LOTREL) 5-10 MG capsule Take 1 capsule by mouth daily.   Yes [provider]  aspirin 325 MG tablet Take 1 tablet (325 mg total) by mouth daily. 12/03/13  Yes Mikhail, Nita SellsMaryann, DO  atorvastatin (LIPITOR) 20 MG tablet Take 20 mg by mouth daily.   Yes [provider]  Cholecalciferol (VITAMIN D PO) Take 2 capsules by mouth daily.   Yes [provider]  citalopram (CELEXA) 10 MG tablet Take 1 tablet by mouth at bedtime. 05/18/15  Yes [provider]  finasteride (PROSCAR) 5 MG tablet Take 5 mg by mouth daily.  05/18/17  Yes [provider]  folic acid (FOLVITE) 1 MG tablet Take 1 tablet (1 mg total) by mouth daily. 07/10/15  Yes Meredeth IdeLama, Gagan S, MD  ibuprofen (ADVIL,MOTRIN) 200 MG tablet Take 200 mg by mouth every 6 (six) hours as needed for moderate pain.   Yes [provider]  KLOR-CON M10 10 MEQ tablet Take 10 mEq by mouth daily.  05/17/17  Yes [provider]  memantine (NAMENDA) 10 MG tablet Take 10 mg by mouth 2 (two) times daily.    Yes [provider]  metFORMIN (GLUCOPHAGE-XR) 500 MG 24 hr tablet Take 1,000 mg by mouth daily. 04/14/16  Yes [provider]  Nutritional Supplements (NUTRITIONAL SUPPLEMENT PLUS) LIQD Take 1 Bottle by mouth 2 (two) times daily. Med Pass 120 mL   Yes [provider]    tamsulosin (FLOMAX) 0.4 MG CAPS capsule Take 0.4 mg by mouth daily. 05/18/17  Yes [provider]  vitamin B-12 (CYANOCOBALAMIN) 100 MCG tablet Take 100 mcg by mouth daily.   Yes [provider]   No Known Allergies Review of Systems  Unable to perform ROS: Dementia    Physical Exam  Constitutional: He is sleeping. He appears ill.  Cardiovascular: Normal rate, regular rhythm and normal heart sounds.  Pulmonary/Chest: Effort normal and breath sounds normal.  Skin: Skin is warm and dry.    Vital Signs: BP (!) 96/40 (BP Location: Right Arm)   Pulse 77   Temp 98 F (36.7 C) (Axillary)   Resp 18   Ht 5\' 9"  (1.753 m)   Wt 45.4 kg (100 lb 1.4 oz)   SpO2 99%   BMI 14.78 kg/m  Pain Scale: 0-10   Pain Score: 0-No pain   SpO2: SpO2: 99 % O2 Device:SpO2: 99 % O2 Flow Rate: .O2 Flow Rate (L/min): 1 L/min  IO: Intake/output summary:   Intake/Output Summary (Last 24 hours) at 07/11/2017 0954 Last data filed at 07/11/2017 [id number] Gross per 24 hour  Intake 1264.5 ml  Output 2100 ml  Net -835.5 ml    LBM: Last BM Date: 07/09/17 Baseline Weight: Weight: 49.9 kg (110 lb) Most recent weight: Weight: 45.4 kg (100 lb 1.4 oz)     Palliative Assessment/Data: 30 % at best   Discussed with Dr Caleb PoppNettey  Time In: 1200 Time Out: 1315 Time Total: 75 minutes Greater than 50%  of this time was spent counseling and coordinating care related to the above assessment and plan.  Signed by: Lorinda CreedMary Nicoya Friel, NP  Please contact Palliative Medicine Team phone at [phone] for questions and concerns.  For individual provider: See Shea Evans

## 2017-07-11 NOTE — Progress Notes (Addendum)
[facility] Hospital Liaison:  RN visit  Notified by Steward Cameron, Eric Cameron, of family request for [facility] at [facility] after discharge.  Chart and patient information has been reviewed with [facility] physician.  Hospice eligibility approved by Eric Cameron, [facility] physician.    Writer spoke with sister, Eric Cameron, over the phone to initiate education related to hospice philosophy, services and team approach to care.  Family verbalized understanding of information given.  Per discussion, plan is for discharge to home by [facility]AR on 07/12/17 or 07/13/17.  Please send signed and completed DNR form home with patient/family.  Patient will need prescriptions for discharge comfort medications.  DME needs have been discussed, patient currently has the following equipment in the home:  Shower chair, walker, wheelchair.  Family requests the following DME for delivery to the home:  Hospital bed 1/2 rails, OBT.  [facility] equipment manager, Eric Cameron, has been notified and will contact [facility] to arrange delivery to the home.  The home address has been verified and is correct in the chart.  Eric Cameron is the family member to contact to arrange time of delivery at [phone].  [facility] Referral [facility] is aware of the above.  Completed discharge summary will need to be faxed to [facility] at [phone], when final.  Please notify [facility] when patient is ready to leave the unit at discharge.  (Call [phone] or [phone] if its after 5pm.)  [facility] information and contact numbers have been left at bedside for [facility], during visit.  Above information shared with Steward Cameron, [facility].  Please call with any hospice related questions.  Thank you for the referral.  Eric BarthelAmy Evans, RN, BSN [facility] Hospital Liaison [phone]  All hospital liaisons are now on AMION.

## 2017-07-11 NOTE — Evaluation (Signed)
Physical Therapy Evaluation Patient Details Name: Eric Cameron MRN: [id number] DOB: 04-16-1935 Today's Date: 07/11/2017   History of Present Illness  Eric Cameron is a 83 y.o. male with medical history significant of dementia, hypertension, hyperlipidemia, diabetes mellitus, stroke, depression, anxiety, BPH, seizure. Patient presented with weakness, dysuria, failure to thrive.  Clinical Impression  Patient presents with decreased independence and safety with mobility due to deficits listed in PT problem list.  He will benefit from skilled PT in the acute setting to allow d/c to SNF level rehab.  Unclear previous level of function except that he is from home with a sister.  Currently mod A for short distance ambulation and transfers with posterior bias.  Likely higher fall risk than previously.  Recommend SNF level rehab at d/c.     Follow Up Recommendations SNF;Supervision/Assistance - 24 hour    Equipment Recommendations  None recommended by PT    Recommendations for Other Services       Precautions / Restrictions Precautions Precautions: Fall      Mobility  Bed Mobility               General bed mobility comments: up in chair  Transfers Overall transfer level: Needs assistance Equipment used: Rolling walker (2 wheeled) Transfers: Sit to/from Stand Sit to Stand: Mod assist         General transfer comment: lifting and lowering help due to posterior bias  Ambulation/Gait Ambulation/Gait assistance: Mod assist Ambulation Distance (Feet): 50 Feet Assistive device: Rolling walker (2 wheeled) Gait Pattern/deviations: Step-through pattern;Decreased stride length;Leaning posteriorly;Scissoring;Ataxic;Narrow base of support     General Gait Details: initially keeping walker at a distance, improved with goal directed target with step length and walker use, mod A due to posterior leaning and ataxia  Stairs            Wheelchair Mobility    Modified Rankin  (Stroke Patients Only)       Balance Overall balance assessment: Needs assistance Sitting-balance support: Feet supported Sitting balance-Leahy Scale: Fair Sitting balance - Comments: seated at edge of recliner   Standing balance support: Bilateral upper extremity supported Standing balance-Leahy Scale: Poor Standing balance comment: assist for balance even with UE support due to posterior bias                             Pertinent Vitals/Pain Pain Assessment: No/denies pain    Home Living Family/patient expects to be discharged to:: Private residence Living Arrangements: Other relatives Available Help at Discharge: Family;Available 24 hours/day Type of Home: House Home Access: Stairs to enter Entrance Stairs-Rails: None Entrance Stairs-Number of Steps: 1 small step Home Layout: One level        Prior Function Level of Independence: Needs assistance   Gait / Transfers Assistance Needed: patient unable to give information about prior functional level; previous notes state patient was ambulating independently up to5 months ago  ADL's / Homemaking Assistance Needed: previous notes state sister was assisting with bathin and that pt was independent with toileting, but would forget to change his brief        Hand Dominance        Extremity/Trunk Assessment   Upper Extremity Assessment Upper Extremity Assessment: Generalized weakness;Difficult to assess due to impaired cognition    Lower Extremity Assessment Lower Extremity Assessment: Generalized weakness;Difficult to assess due to impaired cognition    Cervical / Trunk Assessment Cervical / Trunk Assessment: Other exceptions Cervical / Trunk  Exceptions: cachexia  Communication   Communication: Expressive difficulties  Cognition Arousal/Alertness: Awake/alert Behavior During Therapy: Flat affect Overall Cognitive Status: No family/caregiver present to determine baseline cognitive functioning                                  General Comments: follows commands inconsistently, minimal verbalizations      General Comments      Exercises     Assessment/Plan    PT Assessment Patient needs continued PT services  PT Problem List Decreased strength;Decreased mobility;Decreased balance;Decreased knowledge of use of DME;Decreased activity tolerance;Decreased safety awareness;Decreased knowledge of precautions       PT Treatment Interventions DME instruction;Therapeutic activities;Gait training;Therapeutic exercise;Patient/family education;Balance training;Functional mobility training    PT Goals (Current goals can be found in the Care Plan section)  Acute Rehab PT Goals Patient Stated Goal: unable to state PT Goal Formulation: Patient unable to participate in goal setting Time For Goal Achievement: 07/25/17 Potential to Achieve Goals: Fair    Frequency Min 3X/week   Barriers to discharge        Co-evaluation               AM-PAC PT "6 Clicks" Daily Activity  Outcome Measure Difficulty turning over in bed (including adjusting bedclothes, sheets and blankets)?: A Lot Difficulty moving from lying on back to sitting on the side of the bed? : Unable Difficulty sitting down on and standing up from a chair with arms (e.g., wheelchair, bedside commode, etc,.)?: Unable Help needed moving to and from a bed to chair (including a wheelchair)?: A Lot Help needed walking in hospital room?: A Lot Help needed climbing 3-5 steps with a railing? : Total 6 Click Score: 9    End of Session Equipment Utilized During Treatment: Gait belt Activity Tolerance: Patient tolerated treatment well Patient left: in chair;with call bell/phone within reach(near nursing station)   PT Visit Diagnosis: Other abnormalities of gait and mobility (R26.89);Muscle weakness (generalized) (M62.81);Other symptoms and signs involving the nervous system (R29.898)    Time: 1610-9604 PT Time  Calculation (min) (ACUTE ONLY): 16 min   Charges:   PT Evaluation $PT Eval Moderate Complexity: 1 Mod     PT G CodesSheran Lawless,  [phone] 07/11/2017   Elray Mcgregor 07/11/2017, 10:15 AM

## 2017-07-12 ENCOUNTER — Inpatient Hospital Stay (HOSPITAL_COMMUNITY): Payer: Medicare Other

## 2017-07-12 DIAGNOSIS — N3000 Acute cystitis without hematuria: Secondary | ICD-10-CM

## 2017-07-12 DIAGNOSIS — E860 Dehydration: Secondary | ICD-10-CM

## 2017-07-12 DIAGNOSIS — J690 Pneumonitis due to inhalation of food and vomit: Secondary | ICD-10-CM

## 2017-07-12 LAB — GLUCOSE, CAPILLARY
GLUCOSE-CAPILLARY: 173 mg/dL — AB (ref 65–99)
GLUCOSE-CAPILLARY: 211 mg/dL — AB (ref 65–99)
Glucose-Capillary: 155 mg/dL — ABNORMAL HIGH (ref 65–99)
Glucose-Capillary: 172 mg/dL — ABNORMAL HIGH (ref 65–99)

## 2017-07-12 LAB — PROCALCITONIN: Procalcitonin: 0.15 ng/mL

## 2017-07-12 MED ORDER — IBUPROFEN 100 MG/5ML PO SUSP
400.0000 mg | Freq: Once | ORAL | Status: AC
  Administered 2017-07-12: 400 mg via ORAL
  Filled 2017-07-12: qty 20

## 2017-07-12 MED ORDER — SODIUM CHLORIDE 0.9 % IV BOLUS
250.0000 mL | Freq: Once | INTRAVENOUS | Status: AC
  Administered 2017-07-12: 250 mL via INTRAVENOUS

## 2017-07-12 MED ORDER — TAMSULOSIN HCL 0.4 MG PO CAPS: 0.4000 mg | ORAL_CAPSULE | Freq: Every day | ORAL | Status: DC

## 2017-07-12 MED ORDER — AMOXICILLIN-POT CLAVULANATE 875-125 MG PO TABS
1.0000 | ORAL_TABLET | Freq: Two times a day (BID) | ORAL | Status: DC
  Administered 2017-07-12 – 2017-07-13 (×3): 1 via ORAL
  Filled 2017-07-12 (×3): qty 1

## 2017-07-12 NOTE — Progress Notes (Signed)
Patient blood pressure 70/30 manually, HR 88. Dr. Benjamine MolaVann made aware.  Will continue to monitor.

## 2017-07-12 NOTE — Progress Notes (Signed)
  Speech Language Pathology Treatment: Dysphagia  Patient Details Name: Eric Cameron MRN: [id number] DOB: Dec 15, 1934 Today's Date: 07/12/2017 Time: 1205-1220 SLP Time Calculation (min) (ACUTE ONLY): 15 min  Assessment / Plan / Recommendation Clinical Impression  Pt seen for safety with recommendations and possibility for upgrade in texture. Pt required repositioning; exhibited decreased and delayed mastication with Dys 2 texture and needed cues for attention to task. No cough or other s/s aspiration with solids or thin liquids. Pt in agreement to continue Dys 1 (puree) thin and ST treatment in acute care and recommend at discharge.   HPI HPI: 83 y.o. male with medical history significant of dementia, hypertension, hyperlipidemia, diabetes mellitus, stroke, depression, anxiety, BPH, seizure, who presents with failure to thrive, decreased oral intake, generalized weakness, dysuria.      SLP Plan  Continue with current plan of care       Recommendations  Diet recommendations: Dysphagia 1 (puree);Thin liquid Liquids provided via: Cup;Straw Medication Administration: Crushed with puree Supervision: Patient able to self feed;Intermittent supervision to cue for compensatory strategies Compensations: Slow rate;Small sips/bites;Lingual sweep for clearance of pocketing Postural Changes and/or Swallow Maneuvers: Seated upright 90 degrees                Oral Care Recommendations: Oral care BID Follow up Recommendations: 24 hour supervision/assistance;Other (comment) SLP Visit Diagnosis: Dysphagia, oropharyngeal phase (R13.12) Plan: Continue with current plan of care                       Eric Cameron, Eric Cameron 07/12/2017, 1:34 PM  Eric Cameron Eric Cameron M.Ed [employer]P Pager [phone]

## 2017-07-12 NOTE — Plan of Care (Signed)
  Problem: Clinical Measurements: Goal: Ability to maintain clinical measurements within normal limits will improve Outcome: Progressing   Problem: Clinical Measurements: Goal: Will remain free from infection Outcome: Progressing   Problem: Clinical Measurements: Goal: Diagnostic test results will improve Outcome: Progressing   

## 2017-07-12 NOTE — Progress Notes (Signed)
PROGRESS NOTE    Eric Cameron  ZOX:[id number] DOB: [birth year] DOA: 07/08/2017 PCP: Iona Hansen, NP   Brief Narrative: Eric Cameron is a 83 y.o. male with medical history significant of dementia, hypertension, hyperlipidemia, diabetes mellitus, stroke, depression, anxiety, BPH, seizure. Patient presented with weakness, dysuria, failure to thrive. Now with aspiration PNA-- plan is home with hospice in AM.   Assessment & Plan:   Principal Problem:   UTI (urinary tract infection) Active Problems:   HTN (hypertension)   Diabetes mellitus type 2, uncontrolled (HCC)   HLD (hyperlipidemia)   H/O: CVA (cerebrovascular accident)   Alzheimer's dementia without behavioral disturbance   Acute renal failure (ARF) (HCC)   Severe protein-calorie malnutrition (HCC)   Normocytic anemia   Failure to thrive in adult   Hyperkalemia   Hyponatremia   Palliative care by specialist  Aspiration PNA -change abx to augmentin -DYS diet -know risk for re-ocurance  UTI Questionable urinalysis. Not a clean catch. No bacteria seen. Urine culture- NGTD -chang abx to treat PNA  Hypotension Lactic acid normal -encourage PO intake  Acute metabolic encephalopathy -plan to go home with hospice  Dementia/Failure to thrive Home with hospice  Essential hypertension D/c meds due to hypotension  Acute kidney injury Secondary to hypovolemia -resolved with IVF  Hyponatremia Resolved   Diabetes mellitus, type 2 Last A1C of 7.9%. -Continue SSI  Hyperlipidemia -Continue Lipitor as able  History of CVA -Continue Aspirin and Lipitor  Normocytic anemia No evidence of blood loss. Decrease overnight in light of significant fluid resuscitation.   Hyperkalemia In setting of AKI. Resolved with treatment.  Chronic diastolic heart failure Grade 1 diastolic dysfunction from Transthoracic Echocardiogram in 2017. Stable.   DVT prophylaxis: Lovenox Code Status:   Code Status: DNR Family  Communication: Sister on telephone Disposition Plan: home with hospice in AM if afebrile  Consultants:   Palliative care    Subjective: Says yes to all questions  Objective: Vitals:   07/12/17 0614 07/12/17 0800 07/12/17 1152 07/12/17 1243  BP:  (!) 96/48 (!) 70/30 (!) 80/42  Pulse:  88 88   Resp:  20    Temp: (!) 101.4 F (38.6 C) 98.9 F (37.2 C)    TempSrc: Oral Oral    SpO2:      Weight:      Height:        Intake/Output Summary (Last 24 hours) at 07/12/2017 1320 Last data filed at 07/12/2017 1317 Gross per 24 hour  Intake 2817 ml  Output -  Net 2817 ml   Filed Weights   07/10/17 0457 07/11/17 0500 07/12/17 0422  Weight: 43.8 kg (96 lb 9 oz) 45.4 kg (100 lb 1.4 oz) 48.3 kg (106 lb 7.7 oz)    Examination:  General exam: laying on left side- chronically ill appaering Respiratory: rhales b/l Gastrointestinal system: +Bs, soft Central nervous system: Sleeping but arouses. Responds to voice. Follows commands to grab my fingers and squeeze Extremities: No edema    Data Reviewed: I have personally reviewed following labs and imaging studies  CBC: Recent Labs  Lab 07/08/17 2124 07/09/17 0345 07/09/17 0758 07/10/17 0636 07/11/17 0804  WBC 17.7* 26.6* 18.2* 12.0* 8.7  HGB 8.4* 8.7* 8.7* 7.7* 7.4*  HCT 26.1* 27.4* 27.3* 24.6* 24.1*  MCV 87.3 87.3 86.7 86.9 87.6  PLT 577* 697* 577* 537* 492*   Basic Metabolic Panel: Recent Labs  Lab 07/08/17 2124 07/09/17 0345 07/09/17 0758 07/09/17 1410 07/10/17 0636 07/11/17 0804  NA 128* 136 141  --  146* 146*  K 7.3* 6.2* 6.0* 4.5 3.5 3.3*  CL 105 109 114*  --  115* 118*  CO2 14* 16* 17*  --  18* 20*  GLUCOSE 158* 52* 139*  --  100* 147*  BUN 79* 65* 56*  --  39* 25*  CREATININE 1.42* 1.16 1.10  --  0.86 0.75  CALCIUM 9.4 10.9* 10.1  --  8.3* 8.4*   GFR: Estimated Creatinine Clearance: 50.3 mL/min (by C-G formula based on SCr of 0.75 mg/dL). Liver Function Tests: Recent Labs  Lab 07/08/17 2124  AST 17   ALT 18  ALKPHOS 63  BILITOT 0.3  PROT 8.3*  ALBUMIN 2.3*   Recent Labs  Lab 07/08/17 2124  LIPASE 78*   No results for input(s): AMMONIA in the last 168 hours. Coagulation Profile: No results for input(s): INR, PROTIME in the last 168 hours. Cardiac Enzymes: Recent Labs  Lab 07/10/17 1103  CKTOTAL 204   BNP (last 3 results) No results for input(s): PROBNP in the last 8760 hours. HbA1C: No results for input(s): HGBA1C in the last 72 hours. CBG: Recent Labs  Lab 07/11/17 1146 07/11/17 1700 07/11/17 2136 07/12/17 0722 07/12/17 1131  GLUCAP 284* 138* 143* 155* 173*   Lipid Profile: No results for input(s): CHOL, HDL, LDLCALC, TRIG, CHOLHDL, LDLDIRECT in the last 72 hours. Thyroid Function Tests: No results for input(s): TSH, T4TOTAL, FREET4, T3FREE, THYROIDAB in the last 72 hours. Anemia Panel: No results for input(s): VITAMINB12, FOLATE, FERRITIN, TIBC, IRON, RETICCTPCT in the last 72 hours. Sepsis Labs: Recent Labs  Lab 07/09/17 1952 07/11/17 0804 07/12/17 0629  PROCALCITON  --  <0.10 0.15  LATICACIDVEN 1.0  --   --     Recent Results (from the past 240 hour(s))  Urine Culture     Status: Abnormal   Collection Time: 07/09/17  1:46 AM  Result Value Ref Range Status   Specimen Description URINE, RANDOM  Final   Special Requests   Final    NONE Performed at [facility], [address] [place], [place] [address]    Culture MULTIPLE SPECIES PRESENT, SUGGEST RECOLLECTION (A)  Final   Report Status 07/10/2017 FINAL  Final  Culture, blood (Routine X 2) w Reflex to ID Panel     Status: None (Preliminary result)   Collection Time: 07/09/17  3:42 AM  Result Value Ref Range Status   Specimen Description BLOOD RIGHT HAND  Final   Special Requests IN PEDIATRIC BOTTLE Blood Culture adequate volume  Final   Culture   Final    NO GROWTH 3 DAYS Performed at [facility], [address] [place], [place] [address]    Report Status PENDING  Incomplete    Culture, blood (Routine X 2) w Reflex to ID Panel     Status: None (Preliminary result)   Collection Time: 07/09/17  3:42 AM  Result Value Ref Range Status   Specimen Description BLOOD RIGHT HAND  Final   Special Requests   Final    BOTTLES DRAWN AEROBIC AND ANAEROBIC Blood Culture adequate volume   Culture   Final    NO GROWTH 3 DAYS Performed at [facility], [address] [place], [place] [address]    Report Status PENDING  Incomplete         Radiology Studies: Dg Chest Port 1 View  Result Date: 07/12/2017 CLINICAL DATA:  Aspiration. EXAM: PORTABLE CHEST 1 VIEW COMPARISON:  Chest x-ray dated July 09, 2017. FINDINGS:  The heart size and mediastinal contours are within normal limits. Normal pulmonary vascularity. Increased reticular nodularity in both lung bases. No focal consolidation, pleural effusion, or pneumothorax. Multiple skin folds project over the left chest wall. No acute osseous abnormality. IMPRESSION: Increasing interstitial nodularity at both lung bases could reflect aspiration. Electronically Signed   By: Obie Dredge M.D.   On: 07/12/2017 12:45        Scheduled Meds: . amoxicillin-clavulanate  1 tablet Oral Q12H  . aspirin  325 mg Oral Daily  . atorvastatin  20 mg Oral Daily  . citalopram  10 mg Oral QHS  . enoxaparin (LOVENOX) injection  40 mg Subcutaneous Q24H  . feeding supplement (ENSURE ENLIVE)   Oral BID  . finasteride  5 mg Oral Daily  . folic acid  1 mg Oral Daily  . insulin aspart  0-5 Units Subcutaneous QHS  . insulin aspart  0-9 Units Subcutaneous TID WC  . memantine  10 mg Oral BID  . [START ON 07/13/2017] tamsulosin  0.4 mg Oral QPC supper  . thiamine  100 mg Oral Daily  . vitamin B-12  100 mcg Oral Daily   Continuous Infusions:    LOS: 2 days     Joseph Art, DO [facility] 07/12/2017, 1:20 PM   If 7PM-7AM, please contact night-coverage [email or web address] Password [email or web address] 07/12/2017, 1:20 PM

## 2017-07-12 NOTE — Plan of Care (Signed)
  Problem: Elimination: Goal: Will not experience complications related to bowel motility 07/12/2017 0050 by Jeanella Flatteryhomas, Fletcher Ostermiller T, RN Outcome: Progressing 07/12/2017 0050 by Jeanella Flatteryhomas, Karlin Heilman T, RN Outcome: Progressing   Problem: Elimination: Goal: Will not experience complications related to bowel motility 07/12/2017 0050 by Jeanella Flatteryhomas, Tearah Saulsbury T, RN Outcome: Progressing 07/12/2017 0050 by Jeanella Flatteryhomas, Paelyn Smick T, RN Outcome: Progressing   Problem: Elimination: Goal: Will not experience complications related to urinary retention 07/12/2017 0050 by Jeanella Flatteryhomas, Caellum Mancil T, RN Outcome: Progressing 07/12/2017 0050 by Jeanella Flatteryhomas, Hersel Mcmeen T, RN Outcome: Progressing

## 2017-07-12 NOTE — Progress Notes (Addendum)
[facility] Hospital Liaison:  RN visit  Notified by Steward DroneBrenda, [facility], of family request [facility] after discharge.  Chart and patient information has been [facility] with [facility].  Hospice eligibility approved by C. Monguilod, [facility] physician.    Writer spoke with sister, [name], over the phone to initiate education related to hospice philosophy, services and team approach to care.  Family verbalized understanding of information given.  Per discussion, plan is for discharge to home by PTAR on 07/12/17 or 07/13/17.  Please send signed and completed DNR form home with patient/family.  Patient will need prescriptions for discharge comfort medications.  DME needs have been discussed, patient currently has the following equipment in the home:  Shower chair, walker, wheelchair.  Family requests the following DME for delivery to the home:  Hospital bed 1/2 rails, OBT.  [facility] equipment manager, Loistine SimasJewel Hughes, has been notified and will contact [facility] to arrange delivery to the home.  The home address has been verified and is correct in the chart.  [name] is the family member to contact to arrange time of delivery at [phone].  UPDATE:  DME to be delivered 07/12/17 between 2pm - 6pm to the home.    [facility]erral [facility] is aware of the above.  Completed discharge summary will need to be faxed to [facility] at [phone], when final.  Please notify [facility] when patient is ready to leave the unit at discharge.  (Call [phone] or [phone] if its after 5pm.)  [facility] information and contact numbers have been left at bedside for [facility], during visit.  Above information shared with Cathlean Cowerlesia, [facility].  Please call with any hospice related questions.  Thank you for the referral.  Adele BarthelAmy Evans, RN, BSN [facility] Hospital Liaison [phone]  All hospital liaisons are now on AMION.

## 2017-07-12 NOTE — Plan of Care (Signed)
  Problem: Skin Integrity: Goal: Risk for impaired skin integrity will decrease Outcome: Progressing  Pt. Repositioned, foam dressings in place

## 2017-07-12 NOTE — Progress Notes (Signed)
Paged NP about fever. Will continue to monitor.

## 2017-07-13 DIAGNOSIS — R627 Adult failure to thrive: Secondary | ICD-10-CM

## 2017-07-13 DIAGNOSIS — T17908S Unspecified foreign body in respiratory tract, part unspecified causing other injury, sequela: Secondary | ICD-10-CM

## 2017-07-13 DIAGNOSIS — N179 Acute kidney failure, unspecified: Secondary | ICD-10-CM

## 2017-07-13 DIAGNOSIS — E1165 Type 2 diabetes mellitus with hyperglycemia: Secondary | ICD-10-CM

## 2017-07-13 LAB — GLUCOSE, CAPILLARY
Glucose-Capillary: 121 mg/dL — ABNORMAL HIGH (ref 65–99)
Glucose-Capillary: 233 mg/dL — ABNORMAL HIGH (ref 65–99)

## 2017-07-13 LAB — PROCALCITONIN: PROCALCITONIN: 0.29 ng/mL

## 2017-07-13 MED ORDER — TAMSULOSIN HCL 0.4 MG PO CAPS: 0.4000 mg | ORAL_CAPSULE | Freq: Every day | ORAL | 11 refills | Status: AC

## 2017-07-13 MED ORDER — LORAZEPAM 1 MG PO TABS: 1.0000 mg | ORAL_TABLET | Freq: Three times a day (TID) | ORAL | 0 refills | Status: AC | PRN

## 2017-07-13 MED ORDER — MORPHINE SULFATE (CONCENTRATE) 10 MG /0.5 ML PO SOLN: 5.0000 mg | ORAL | 0 refills | Status: AC | PRN

## 2017-07-13 MED ORDER — ACETAMINOPHEN 325 MG PO TABS: 650.0000 mg | ORAL_TABLET | Freq: Every day | ORAL | Status: AC

## 2017-07-13 MED ORDER — AMOXICILLIN-POT CLAVULANATE 875-125 MG PO TABS: 1.0000 | ORAL_TABLET | Freq: Two times a day (BID) | ORAL | 0 refills | Status: AC

## 2017-07-13 MED ORDER — ACETAMINOPHEN 325 MG PO TABS
650.0000 mg | ORAL_TABLET | Freq: Four times a day (QID) | ORAL | Status: DC
  Administered 2017-07-13: 650 mg via ORAL
  Filled 2017-07-13: qty 2

## 2017-07-13 NOTE — Discharge Summary (Signed)
Physician Discharge Summary  Eric Cameron ZOX:[id number] DOB: [birth year] DOA: 07/08/2017  PCP: Iona Hansen, NP  Admit date: 07/08/2017 Discharge date: 07/13/2017   Recommendations for Outpatient Follow-Up:   1. Home with hospice 2. Comfort medication prescriptions given 3. DNR   Discharge Diagnosis:   Principal Problem:   UTI (urinary tract infection) Active Problems:   HTN (hypertension)   Diabetes mellitus type 2, uncontrolled (HCC)   HLD (hyperlipidemia)   H/O: CVA (cerebrovascular accident)   Alzheimer's dementia without behavioral disturbance   Acute renal failure (ARF) (HCC)   Severe protein-calorie malnutrition (HCC)   Normocytic anemia   Failure to thrive in adult   Hyperkalemia   Hyponatremia   Palliative care by specialist   Discharge disposition:  Home.  Discharge Condition: Improved.  Diet recommendation: DYS 1 diet  Wound care: None.   History of Present Illness:   Eric Cameron is a 83 y.o. male with medical history significant of dementia, hypertension, hyperlipidemia, diabetes mellitus, stroke, depression, anxiety, BPH, seizure, who presents with failure to thrive, decreased oral intake, generalized weakness, dysuria.  Patient with advanced dementia. He is a poor historian and is unable to provide accurate medical history, therefore, most of the history is obtained by discussing the case with ED physician and with the nursing staff. EDP was able to talk to his sister earlier. Pt has advanced dementia. He requires total care by his sister. In the past several days, patient has decreased oral intake, and generalized weakness. Her sister switched to soft foods which helped,  but now he won't eat that any more. When saw pt in ED, he knows his own name, and oriented to place, but not to time. He said he is hurting when urinating. Denies chest pain, cough, nausea, vomiting, diarrhea. He said he had abdominal pain earlier, but now now. He moves all  extremities.      Hospital Course by Problem:   Aspiration PNA -change abx to augmentin -DYS diet -known risk for re-ocurance  UTI Questionable urinalysis. Not a clean catch. No bacteria seen. Urine culture- NGTD -chang abx to treat PNA  Hypotension Lactic acid normal -encourage PO intake  Acute metabolic encephalopathy -plan to go home with hospice  Dementia/Failure to thrive Home with hospice  Essential hypertension D/c meds due to hypotension  Acute kidney injury Secondary to hypovolemia -resolved with IVF  Hyponatremia Resolved   Diabetes mellitus, type 2 Last A1C of 7.9%. -comfort focus  Hyperlipidemia -Continue Lipitor as able  History of CVA -Continue Aspirin and Lipitor  Normocytic anemia No evidence of blood loss -comfort focus for now  Hyperkalemia In setting of AKI. Resolved with treatment.  Chronic diastolic heart failure Grade 1 diastolic dysfunction from Transthoracic Echocardiogram in 2017. Stable.        Medical Consultants:   Palliative care  Discharge Exam:   Vitals:   07/13/17 1100 07/13/17 1219  BP: 100/61 (!) 94/49  Pulse: 84 85  Resp:  18  Temp: 99.2 F (37.3 C) 98.9 F (37.2 C)  SpO2: 100% 98%   Vitals:   07/12/17 2138 07/13/17 0530 07/13/17 1100 07/13/17 1219  BP: (!) 88/47 (!) 79/47 100/61 (!) 94/49  Pulse: 86 90 84 85  Resp: 20   18  Temp: (!) 102 F (38.9 C) 98 F (36.7 C) 99.2 F (37.3 C) 98.9 F (37.2 C)  TempSrc: Oral Axillary Oral Axillary  SpO2: 96% 100% 100% 98%  Weight:  48.9 kg (107 lb 12.9 oz)  Height:        Gen:  NAD    The results of significant diagnostics from this hospitalization (including imaging, microbiology, ancillary and laboratory) are listed below for reference.     Procedures and Diagnostic Studies:   Dg Abdomen 1 View  Result Date: 07/09/2017 CLINICAL DATA:  NG tube confirmation. EXAM: ABDOMEN - 1 VIEW COMPARISON:  None. FINDINGS: Enteric tube  in place with tip and side-port below the diaphragm, however the tip is at the gastric fundus directed cranially. No bowel dilatation in the included upper abdomen. No evidence of free air. IMPRESSION: Tip and side port of the enteric tube below the diaphragm in the stomach, however the tube is looped with tip directed cranially. If tube is to be used for feeding, consider retraction of 5-10 cm. Electronically Signed   By: Rubye OaksMelanie  Ehinger M.D.   On: 07/09/2017 06:15   Dg Chest Portable 1 View  Result Date: 07/09/2017 CLINICAL DATA:  Weakness. EXAM: PORTABLE CHEST 1 VIEW COMPARISON:  Radiograph 02/01/2017 FINDINGS: The cardiomediastinal contours are unchanged with unchanged aortic tortuosity. Multiple skin folds project over the left chest wall. Pulmonary vasculature is normal. No consolidation, pleural effusion, or pneumothorax. No acute osseous abnormalities are seen. IMPRESSION: No active disease. Electronically Signed   By: Rubye OaksMelanie  Ehinger M.D.   On: 07/09/2017 02:39     Labs:   Basic Metabolic Panel: Recent Labs  Lab 07/08/17 2124 07/09/17 0345 07/09/17 0758  07/10/17 0636 07/11/17 0804  NA 128* 136 141  --  146* 146*  K 7.3* 6.2* 6.0*   < > 3.5 3.3*  CL 105 109 114*  --  115* 118*  CO2 14* 16* 17*  --  18* 20*  GLUCOSE 158* 52* 139*  --  100* 147*  BUN 79* 65* 56*  --  39* 25*  CREATININE 1.42* 1.16 1.10  --  0.86 0.75  CALCIUM 9.4 10.9* 10.1  --  8.3* 8.4*   < > = values in this interval not displayed.   GFR Estimated Creatinine Clearance: 50.9 mL/min (by C-G formula based on SCr of 0.75 mg/dL). Liver Function Tests: Recent Labs  Lab 07/08/17 2124  AST 17  ALT 18  ALKPHOS 63  BILITOT 0.3  PROT 8.3*  ALBUMIN 2.3*   Recent Labs  Lab 07/08/17 2124  LIPASE 78*   No results for input(s): AMMONIA in the last 168 hours. Coagulation profile No results for input(s): INR, PROTIME in the last 168 hours.  CBC: Recent Labs  Lab 07/08/17 2124 07/09/17 0345  07/09/17 0758 07/10/17 0636 07/11/17 0804  WBC 17.7* 26.6* 18.2* 12.0* 8.7  HGB 8.4* 8.7* 8.7* 7.7* 7.4*  HCT 26.1* 27.4* 27.3* 24.6* 24.1*  MCV 87.3 87.3 86.7 86.9 87.6  PLT 577* 697* 577* 537* 492*   Cardiac Enzymes: Recent Labs  Lab 07/10/17 1103  CKTOTAL 204   BNP: Invalid input(s): POCBNP CBG: Recent Labs  Lab 07/12/17 1131 07/12/17 1645 07/12/17 2211 07/13/17 0810 07/13/17 1213  GLUCAP 173* 172* 211* 121* 233*   D-Dimer No results for input(s): DDIMER in the last 72 hours. Hgb A1c No results for input(s): HGBA1C in the last 72 hours. Lipid Profile No results for input(s): CHOL, HDL, LDLCALC, TRIG, CHOLHDL, LDLDIRECT in the last 72 hours. Thyroid function studies No results for input(s): TSH, T4TOTAL, T3FREE, THYROIDAB in the last 72 hours.  Invalid input(s): FREET3 Anemia work up No results for input(s): VITAMINB12, FOLATE, FERRITIN, TIBC, IRON, RETICCTPCT in the last 72 hours. Microbiology  Recent Results (from the past 240 hour(s))  Urine Culture     Status: Abnormal   Collection Time: 07/09/17  1:46 AM  Result Value Ref Range Status   Specimen Description URINE, RANDOM  Final   Special Requests   Final    NONE Performed at [facility], [address], [place], [place] [address]    Culture MULTIPLE SPECIES PRESENT, SUGGEST RECOLLECTION (A)  Final   Report Status 07/10/2017 FINAL  Final  Culture, blood (Routine X 2) w Reflex to ID Panel     Status: None (Preliminary result)   Collection Time: 07/09/17  3:42 AM  Result Value Ref Range Status   Specimen Description BLOOD RIGHT HAND  Final   Special Requests IN PEDIATRIC BOTTLE Blood Culture adequate volume  Final   Culture   Final    NO GROWTH 4 DAYS Performed at [facility], [address], [place], [place] [address]    Report Status PENDING  Incomplete  Culture, blood (Routine X 2) w Reflex to ID Panel     Status: None (Preliminary result)   Collection Time: 07/09/17  3:42 AM  Result  Value Ref Range Status   Specimen Description BLOOD RIGHT HAND  Final   Special Requests   Final    BOTTLES DRAWN AEROBIC AND ANAEROBIC Blood Culture adequate volume   Culture   Final    NO GROWTH 4 DAYS Performed at [facility], [address], [place], [place] [address]    Report Status PENDING  Incomplete     Discharge Instructions:   Discharge Instructions    Discharge instructions   Complete by:  As directed    DYS 1 thin liquids     Allergies as of 07/13/2017   No Known Allergies     Medication List    STOP taking these medications   amLODipine-benazepril 5-10 MG capsule Commonly known as:  LOTREL   atorvastatin 20 MG tablet Commonly known as:  LIPITOR   HIGH PROTEIN PO   KLOR-CON M10 10 MEQ tablet Generic drug:  potassium chloride   metFORMIN 500 MG 24 hr tablet Commonly known as:  GLUCOPHAGE-XR   VITAMIN D PO     TAKE these medications   acetaminophen 325 MG tablet Commonly known as:  TYLENOL Take 2 tablets (650 mg total) by mouth at bedtime.   amoxicillin-clavulanate 875-125 MG tablet Commonly known as:  AUGMENTIN Take 1 tablet by mouth every 12 (twelve) hours.   aspirin 325 MG tablet Take 1 tablet (325 mg total) by mouth daily.   citalopram 10 MG tablet Commonly known as:  CELEXA Take 1 tablet by mouth at bedtime.   finasteride 5 MG tablet Commonly known as:  PROSCAR Take 5 mg by mouth daily.   folic acid 1 MG tablet Commonly known as:  FOLVITE Take 1 tablet (1 mg total) by mouth daily.   ibuprofen 200 MG tablet Commonly known as:  ADVIL,MOTRIN Take 200 mg by mouth every 6 (six) hours as needed for moderate pain.   LORazepam 1 MG tablet Commonly known as:  ATIVAN Take 1 tablet (1 mg total) by mouth every 8 (eight) hours as needed for up to 10 doses for anxiety.   memantine 10 MG tablet Commonly known as:  NAMENDA Take 10 mg by mouth 2 (two) times daily.   morphine CONCENTRATE 10 mg / 0.5 ml concentrated solution Take  0.25 mLs (5 mg total) by mouth every 2 (two) hours as  needed for severe pain, anxiety or shortness of breath.   NUTRITIONAL SUPPLEMENT PLUS Liqd Take 1 Bottle by mouth 2 (two) times daily. Med Pass 120 mL   tamsulosin 0.4 MG Caps capsule Commonly known as:  FLOMAX Take 1 capsule (0.4 mg total) by mouth daily after supper. What changed:  when to take this   vitamin B-12 100 MCG tablet Commonly known as:  CYANOCOBALAMIN Take 100 mcg by mouth daily.      Follow-up Information    [facility] Follow up.   Specialty:  Hospice and Palliative Medicine Why:  they will provide care for you at your home Contact information: [address] [place] [address] [phone]        Iona Hansen, NP Follow up.   Specialty:  Nurse Practitioner Contact information: [address] [place] [address] [phone]            Time coordinating discharge: 35 min  Signed:  Joseph Art   [facility] 07/13/2017, 4:12 PM

## 2017-07-13 NOTE — Progress Notes (Signed)
[facility]: RN visit  Notified byBrenda, [facility], of family request for [facility] at home after discharge. Chart and patient information has beenreviewedwith [facility] physician.Hospice eligibility approvedby C. Monguilod, [facility] physician.  Writer spoke withsister, [name], over the phone to initiate education related to hospice philosophy, services and team approach to care previously.Family verbalized understanding of information given. Per discussion, plan is for discharge to home byprivate vehicle on 07/13/17.  Per Cathlean CowerAlesia, [facility]CMRN, patient's sister, [name], to come and pick up patient from hospital 07/13/17 at 1:00pm.    Please send signed and completed DNR form home with patient/family. Patient will need prescriptions for discharge comfort medications.  DME needs have been discussed, patient currently has the following equipment in the home:Shower chair, walker, wheelchair.Family requests the following DME for delivery to the home: Hospital bed 1/2 rails, OBT. [facility] equipment manager, Loistine SimasJewel Hughes, has been notified and will contact [facility] to arrange delivery to the home. The home address has been verified and is correct in the chart. Eleanoris the family member to contact to arrange time of deliveryat [phone].  UPDATE:  DME was delivered on 07/12/17.  [facility] Referral Center is aware of the above. Completed discharge summary will need [facility] at [phone], when final. Please notify [facility] when patient is ready to leave the unit at discharge. (Call [phone] or [phone] if its after 5pm.) [facility] information and contact numbers have been left at bedside for [name],during previous visit. Above information shared with Cathlean Cowerlesia, [facility].Please call with any hospice related questions.  Thank you for the referral.  Adele BarthelAmy Evans, RN, BSN [facility] Hospital Liaison [phone]  All hospital liaisonsare now on  AMION.

## 2017-07-13 NOTE — Progress Notes (Signed)
  Speech Language Pathology Treatment: Dysphagia  Patient Details Name: Eric CopaRobert Cameron MRN: [id number] DOB: 03-25-1935 Today's Date: 07/13/2017 Time: [id number] SLP Time Calculation (min) (ACUTE ONLY): 8 min  Assessment / Plan / Recommendation Clinical Impression  Pt with improved timeliness and efficiency with mastication of Dys 3 texture trial for possible upgrade. No oral residue and no s/s aspiration with solid or thin. Recommend upgrade to Dys 2, continue thin. Pt with cognitive deficits and requires intermittent supervision/check in to determine oral residue post meals.    HPI HPI: 83 y.o. male with medical history significant of dementia, hypertension, hyperlipidemia, diabetes mellitus, stroke, depression, anxiety, BPH, seizure, who presents with failure to thrive, decreased oral intake, generalized weakness, dysuria.      SLP Plan  Continue with current plan of care       Recommendations  Diet recommendations: Dysphagia 2 (fine chop);Thin liquid Liquids provided via: Straw Medication Administration: Crushed with puree Supervision: Patient able to self feed;Intermittent supervision to cue for compensatory strategies Compensations: Slow rate;Small sips/bites;Lingual sweep for clearance of pocketing Postural Changes and/or Swallow Maneuvers: Seated upright 90 degrees                Oral Care Recommendations: Oral care BID Follow up Recommendations: Skilled Nursing facility SLP Visit Diagnosis: Dysphagia, oropharyngeal phase (R13.12) Plan: Continue with current plan of care                       Eric Cameron, Eric Cameron 07/13/2017, 11:11 AM  Eric Cameron M.Ed [employer]-SLP Pager [phone]

## 2017-07-13 NOTE — Progress Notes (Signed)
Patient ID: Eric CopaRobert Heinz, male   DOB: 1935/04/06, 83 y.o.   MRN: [id number]  This NP visited patient at the bedside as a follow up for palliative medicine needs and emotional support   Call placed to sister/Elenor for continued discussion regarding prognosis, goals of care and end-of-life care.  Verbalizes an understanding of his limited prognosis and anticipated continued failure to thrive.  Discussed beginning to minimize non essential medication   -dc vitamins, lipitor  Hospice services are in place and patient is likely to discharge home today.  Patient  will need nonemergent transport home.  Discussed with sister  the importance of continued conversation with  their hospice  providers regarding overall plan of care and treatment options,  ensuring decisions are within the context of the patients values and GOCs.  Questions and concerns addressed   Discussed with Dr Benjamine MolaVann  Total time spent on the unit was 20 minutes  Greater than 50% of the time was spent in counseling and coordination of care  Lorinda CreedMary Muad Noga NP  Palliative Medicine Team Team Phone # [phone] Pager [phone]

## 2017-07-13 NOTE — Progress Notes (Signed)
Physical Therapy Treatment Patient Details Name: Eric Cameron MRN: [id number] DOB: August 10, 1934 Today's Date: 07/13/2017    History of Present Illness Eric Cameron is a 83 y.o. male with medical history significant of dementia, hypertension, hyperlipidemia, diabetes mellitus, stroke, depression, anxiety, BPH, seizure. Patient presented with weakness, dysuria, failure to thrive.    PT Comments    Pt performed gait and functional mobility during session.  On arrival in sidelying with blanket on his head.  Pt required multimodal cueing with MAX VCs.  Pt remains to scissor with gait training.  Pt continues to require SNF at d/c base on his current functional limitations.  Pt is a high risk for repeated falls at this time.  Chair alarm set post session.      Follow Up Recommendations  SNF;Supervision/Assistance - 24 hour     Equipment Recommendations  None recommended by PT    Recommendations for Other Services       Precautions / Restrictions Precautions Precautions: Fall Restrictions Weight Bearing Restrictions: No    Mobility  Bed Mobility               General bed mobility comments: up in chair  Transfers Overall transfer level: Needs assistance Equipment used: Rolling walker (2 wheeled) Transfers: Sit to/from Stand Sit to Stand: Mod assist         General transfer comment: lifting and lowering help due to posterior bias remains.  pt attempting to stand with legs crossed.    Ambulation/Gait Ambulation/Gait assistance: Mod assist Ambulation Distance (Feet): 50 Feet Assistive device: Rolling walker (2 wheeled) Gait Pattern/deviations: Step-through pattern;Decreased stride length;Leaning posteriorly;Scissoring;Ataxic;Narrow base of support   Gait velocity interpretation: Below normal speed for age/gender General Gait Details: Pt remains with poor safety awareness and decreased awareness of deficits.  Pt remains to present with ataxic gait pattern and noticeable  scissoring.  Pt does not respond well to correcting gait pattern.     Stairs            Wheelchair Mobility    Modified Rankin (Stroke Patients Only)       Balance Overall balance assessment: Needs assistance Sitting-balance support: Feet supported Sitting balance-Leahy Scale: Fair Sitting balance - Comments: seated at edge of recliner   Standing balance support: Bilateral upper extremity supported Standing balance-Leahy Scale: Poor Standing balance comment: assist for balance even with UE support due to posterior bias                            Cognition Arousal/Alertness: Lethargic Behavior During Therapy: Flat affect Overall Cognitive Status: No family/caregiver present to determine baseline cognitive functioning                                 General Comments: follows commands inconsistently, minimal verbalizations      Exercises      General Comments        Pertinent Vitals/Pain Pain Assessment: Faces Faces Pain Scale: No hurt    Home Living                      Prior Function            PT Goals (current goals can now be found in the care plan section) Acute Rehab PT Goals Patient Stated Goal: unable to state Potential to Achieve Goals: Fair Progress towards PT goals: Not progressing toward goals -  comment(Pt appears at baseline and minimally responsive to commands.  )    Frequency    Min 3X/week      PT Plan Current plan remains appropriate    Co-evaluation              AM-PAC PT "6 Clicks" Daily Activity  Outcome Measure  Difficulty turning over in bed (including adjusting bedclothes, sheets and blankets)?: A Lot Difficulty moving from lying on back to sitting on the side of the bed? : Unable Difficulty sitting down on and standing up from a chair with arms (e.g., wheelchair, bedside commode, etc,.)?: Unable Help needed moving to and from a bed to chair (including a wheelchair)?: A Lot Help  needed walking in hospital room?: A Lot Help needed climbing 3-5 steps with a railing? : Total 6 Click Score: 9    End of Session Equipment Utilized During Treatment: Gait belt Activity Tolerance: Patient tolerated treatment well Patient left: in chair;with call bell/phone within reach with chair alarm set Nurse Communication: Mobility status PT Visit Diagnosis: Other abnormalities of gait and mobility (R26.89);Muscle weakness (generalized) (M62.81);Other symptoms and signs involving the nervous system (R29.898)     Time: [id number] PT Time Calculation (min) (ACUTE ONLY): 30 min  Charges:  $Gait Training: 8-22 mins $Therapeutic Activity: 8-22 mins                    G Codes:       Joycelyn Ruaimee Sarabeth Benton, PTA pager [phone]    Florestine AversAimee J Misael Mcgaha 07/13/2017, 10:22 AM

## 2017-07-14 LAB — CULTURE, BLOOD (ROUTINE X 2)
CULTURE: NO GROWTH
CULTURE: NO GROWTH
SPECIAL REQUESTS: ADEQUATE
SPECIAL REQUESTS: ADEQUATE

## 2017-08-16 DEATH — deceased

## 2018-04-11 IMAGING — CT CT HEAD W/O CM
3 of 4 series · 17 of 47 positions shown, 20 images · non-contrast
Comparison: MRI brain 07/02/2015.  CT head 07/01/2015.

CLINICAL DATA: Acute encephalopathy. History of seizures,
hypertension, diabetes, dementia, and Alzheimer's disease.

EXAM:
CT HEAD WITHOUT CONTRAST
TECHNIQUE: Contiguous axial images were obtained from the base of the skull
through the vertex without intravenous contrast.

[Series 201: head w/o, idose (1) · axial · non-contrast · 0.43mm/px · z∈[+82,+212]mm · 11 of 32 slices shown, 14 images]
[im 3/32  brain]
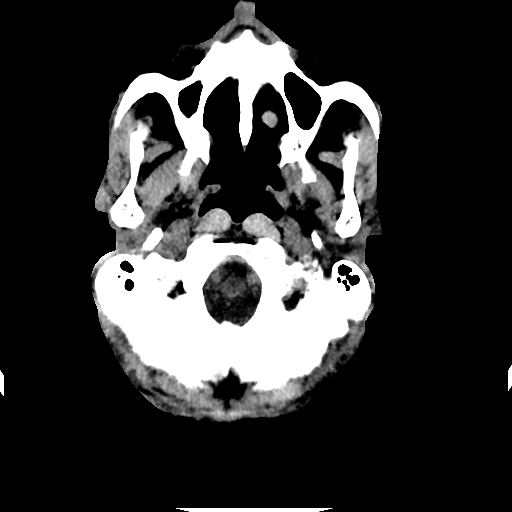
[im 3/32  bone]
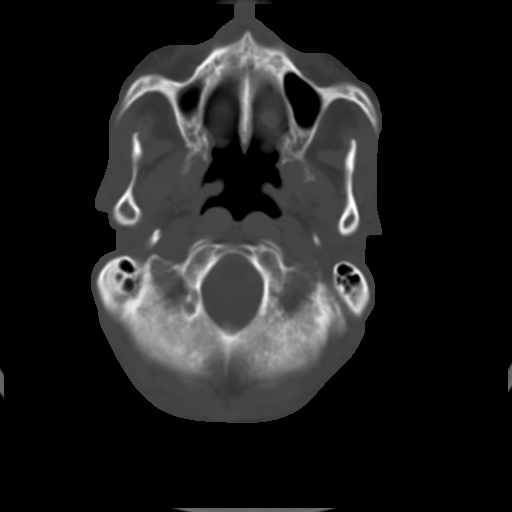
[im 5/32  brain]
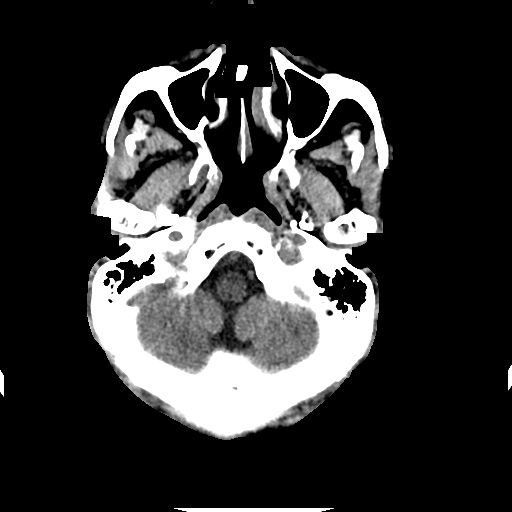
[im 7/32  brain]
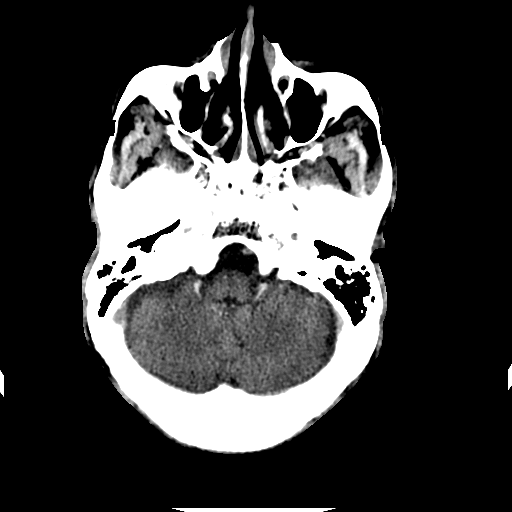
[im 12/32  brain]
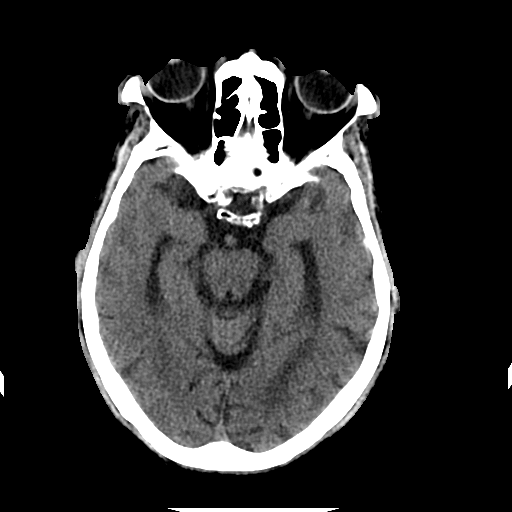
[im 14/32  brain]
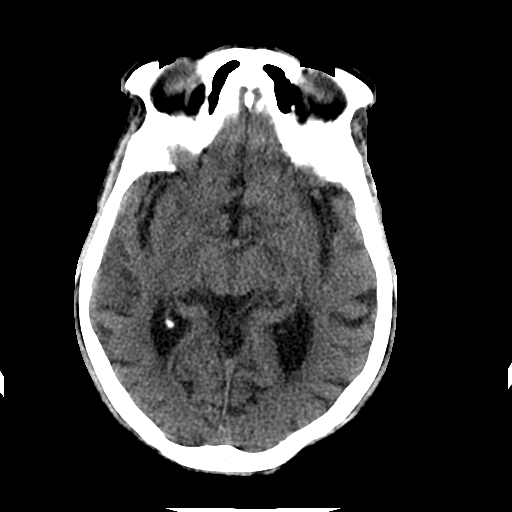
[im 14/32  bone]
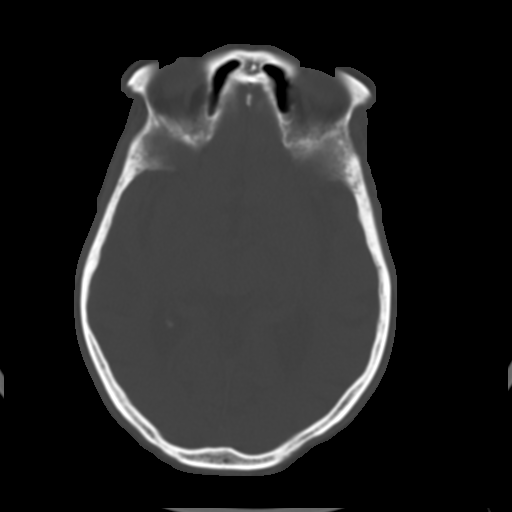
[im 16/32  brain]
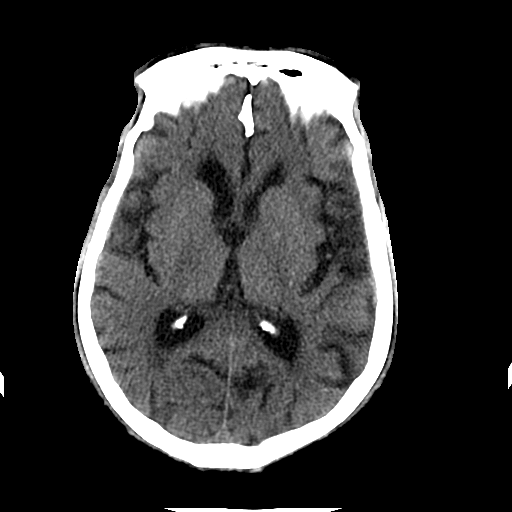
[im 18/32  brain]
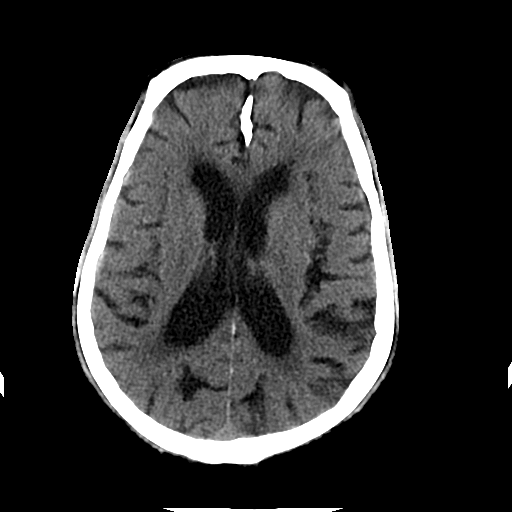
[im 20/32  brain]
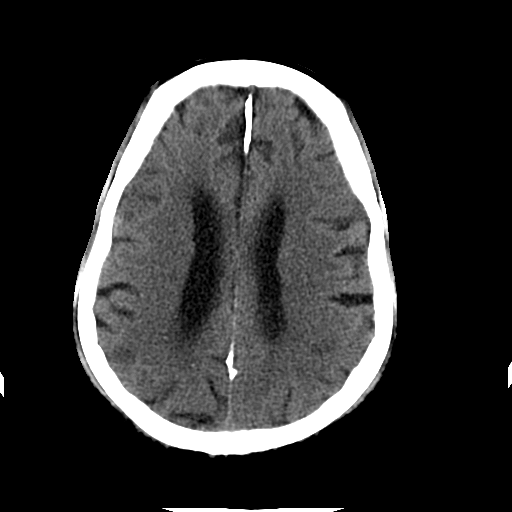
[im 25/32  brain]
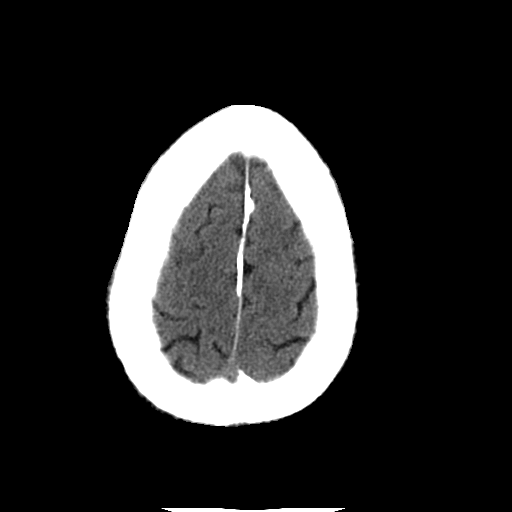
[im 25/32  bone]
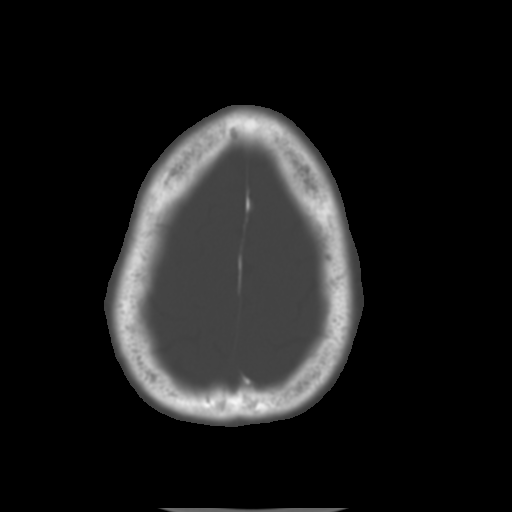
[im 27/32  brain]
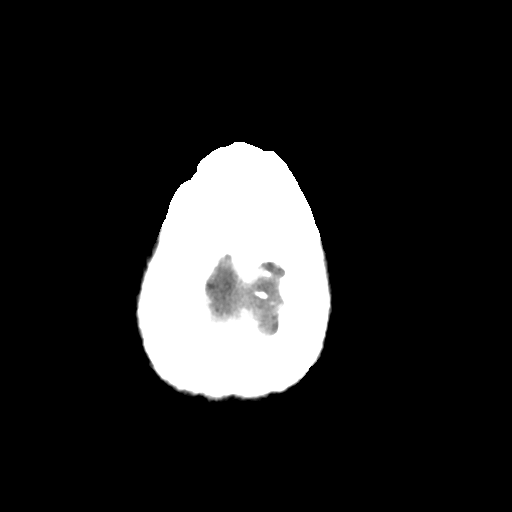
[im 29/32  brain]
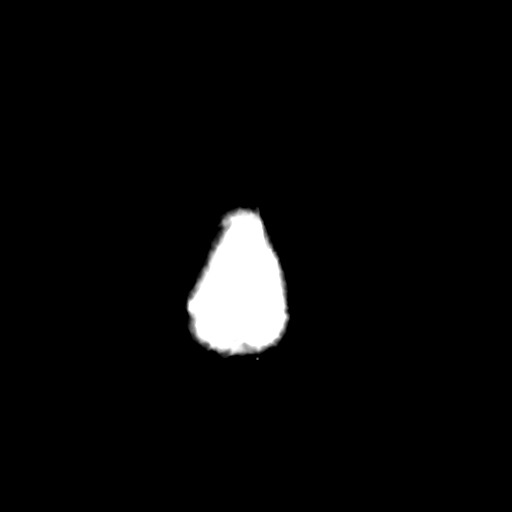

[Series 203: coronal st, idose (1) · coronal · 0.40mm/px · 3 of 73 slices shown]
[im 25/73  brain]
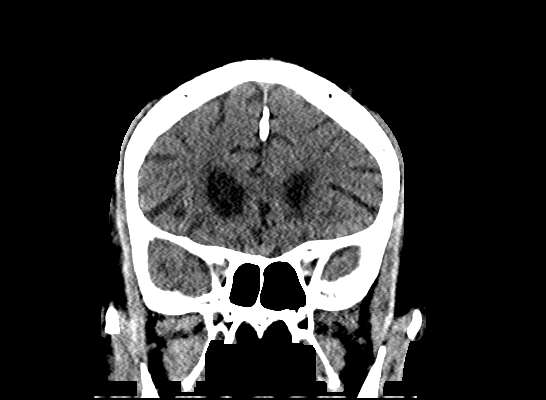
[im 33/73  brain]
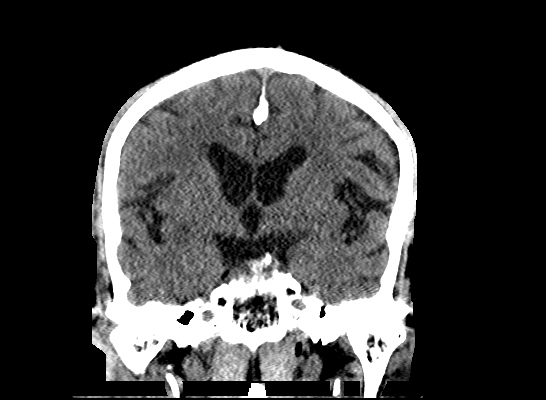
[im 41/73  brain]
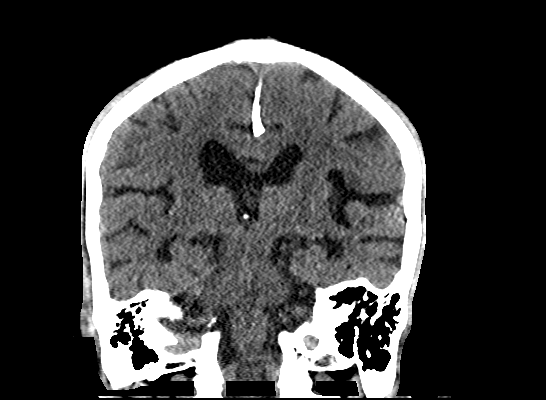

[Series 204: sagittal st, idose (1) · sagittal · 0.40mm/px · 3 of 73 slices shown]
[im 25/73  brain]
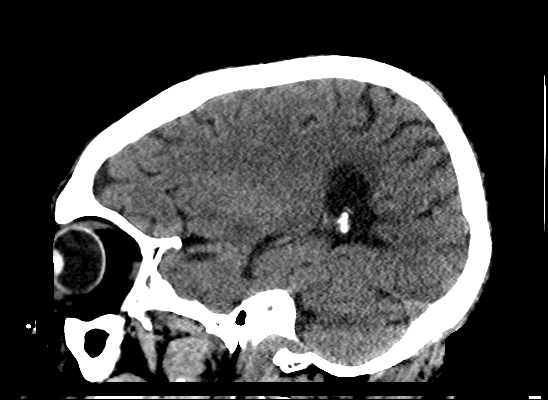
[im 37/73  brain]
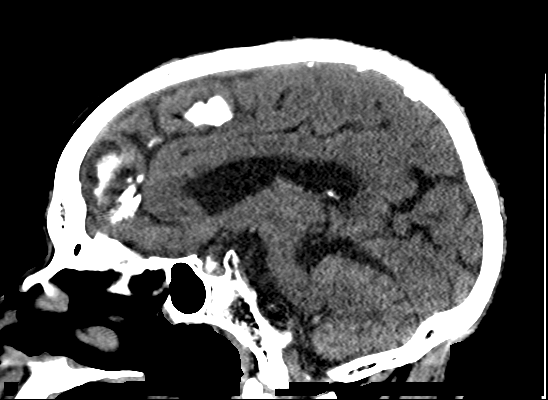
[im 49/73  brain]
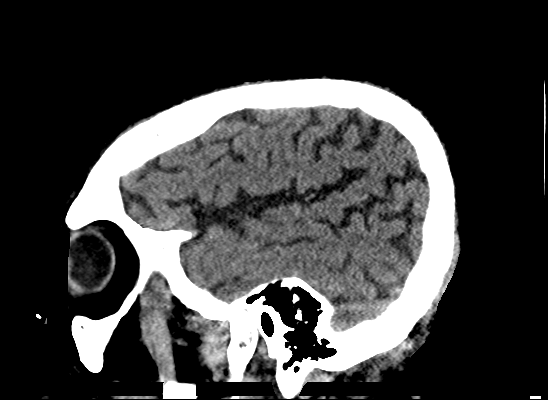

[17 of 47 positions shown; findings below may reference images not displayed]

FINDINGS: Brain: Diffuse cerebral atrophy. Ventricular dilatation consistent
with central atrophy. Patchy low-attenuation changes in the deep
white matter consistent with small vessel ischemia. Prominent
calcification of the falx. No mass effect or midline shift. No
abnormal extra-axial fluid collections. Gray-white matter junctions
are distinct. Basal cisterns are not effaced. No acute intracranial
hemorrhage.

Vascular: Vascular calcifications are present peer

Skull: Normal. Negative for fracture or focal lesion.

Sinuses/Orbits: No acute finding.

Other: No significant changes since previous study.
IMPRESSION: No acute intracranial abnormalities. Mild chronic atrophy and small
vessel ischemic changes.

## 2018-12-15 IMAGING — CR DG CHEST 1V PORT
1 series · 1 of 1 positions shown · non-contrast
Comparison: 06/08/2016

CLINICAL DATA: Altered mental status, question sepsis.

EXAM:
PORTABLE CHEST 1 VIEW

[AP]
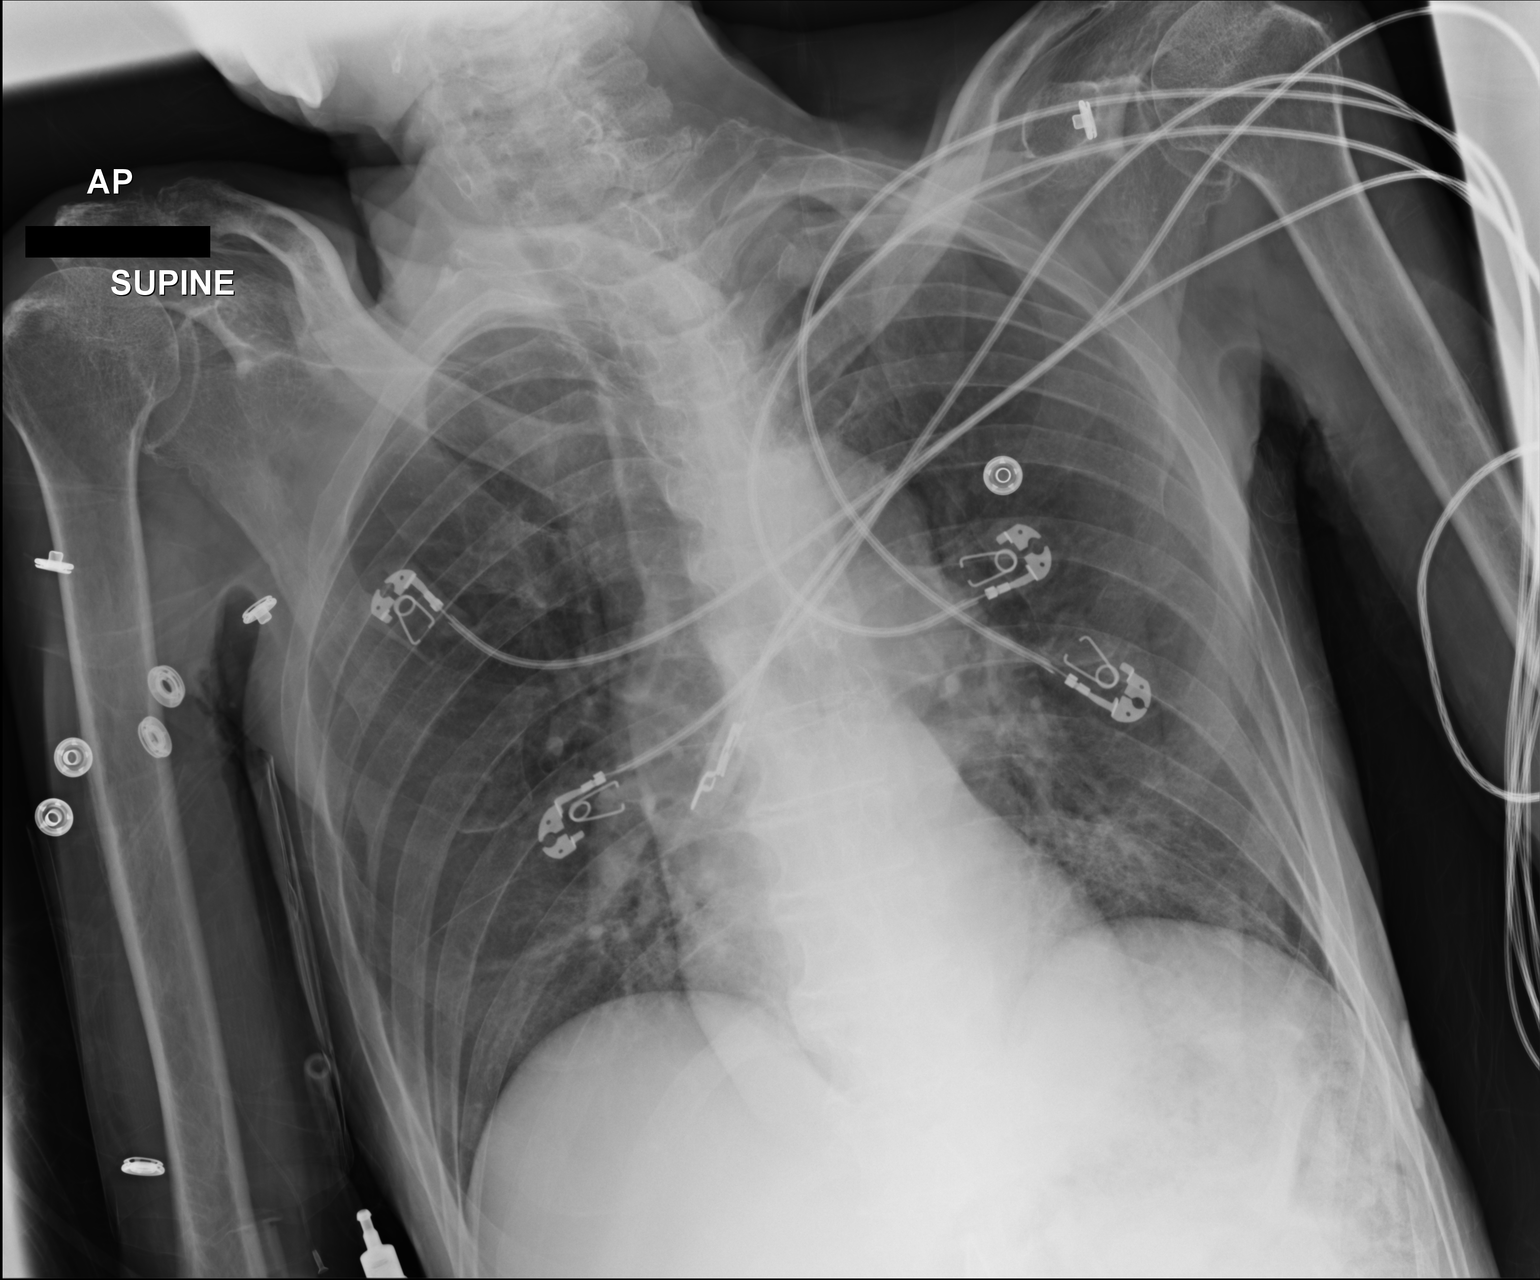

[1 of 1 positions shown; findings below may reference images not displayed]

FINDINGS: The heart size and mediastinal contours are within normal limits.
There is minimal atelectasis at the left lung base. There is mild
uncoiling of the thoracic aorta, similar in appearance to prior. No
acute nor suspicious osseous abnormalities. High-riding humeral
heads are noted bilaterally which may reflect chronic rotator cuff
tears.
IMPRESSION: No active disease.
